# Patient Record
Sex: Male | Born: 1935 | Race: White | Hispanic: No | Smoking: Former smoker
Health system: Southern US, Community
[De-identification: ages and names within clinical notes are randomized; demographics above are authoritative.]

## PROBLEM LIST (undated history)

## (undated) DIAGNOSIS — R609 Edema, unspecified: Secondary | ICD-10-CM

## (undated) DIAGNOSIS — I716 Thoracoabdominal aortic aneurysm, without rupture: Secondary | ICD-10-CM

## (undated) DIAGNOSIS — I10 Essential (primary) hypertension: Secondary | ICD-10-CM

## (undated) DIAGNOSIS — I482 Chronic atrial fibrillation, unspecified: Secondary | ICD-10-CM

## (undated) DIAGNOSIS — H9190 Unspecified hearing loss, unspecified ear: Secondary | ICD-10-CM

## (undated) DIAGNOSIS — I5042 Chronic combined systolic (congestive) and diastolic (congestive) heart failure: Secondary | ICD-10-CM

## (undated) DIAGNOSIS — I499 Cardiac arrhythmia, unspecified: Secondary | ICD-10-CM

## (undated) DIAGNOSIS — Z9289 Personal history of other medical treatment: Secondary | ICD-10-CM

## (undated) DIAGNOSIS — Z5189 Encounter for other specified aftercare: Secondary | ICD-10-CM

## (undated) DIAGNOSIS — IMO0001 Reserved for inherently not codable concepts without codable children: Secondary | ICD-10-CM

## (undated) DIAGNOSIS — I2510 Atherosclerotic heart disease of native coronary artery without angina pectoris: Secondary | ICD-10-CM

## (undated) DIAGNOSIS — M1990 Unspecified osteoarthritis, unspecified site: Secondary | ICD-10-CM

## (undated) DIAGNOSIS — I7100 Dissection of unspecified site of aorta: Secondary | ICD-10-CM

## (undated) HISTORY — PX: JOINT REPLACEMENT: SHX530

## (undated) HISTORY — PX: TONSILLECTOMY: SUR1361

## (undated) HISTORY — DX: Personal history of other medical treatment: Z92.89

## (undated) HISTORY — PX: CARDIAC CATHETERIZATION: SHX172

## (undated) HISTORY — PX: REPLACEMENT TOTAL KNEE: SUR1224

## (undated) HISTORY — PX: HERNIA REPAIR: SHX51

---

## 2006-08-26 ENCOUNTER — Ambulatory Visit: Payer: Self-pay | Admitting: Cardiology

## 2006-09-07 ENCOUNTER — Ambulatory Visit: Payer: Self-pay | Admitting: Cardiology

## 2007-09-10 LAB — HM COLONOSCOPY

## 2008-02-10 ENCOUNTER — Ambulatory Visit: Payer: Self-pay | Admitting: Internal Medicine

## 2008-02-10 DIAGNOSIS — I482 Chronic atrial fibrillation, unspecified: Secondary | ICD-10-CM

## 2008-02-10 DIAGNOSIS — I10 Essential (primary) hypertension: Secondary | ICD-10-CM

## 2008-02-10 DIAGNOSIS — M19079 Primary osteoarthritis, unspecified ankle and foot: Secondary | ICD-10-CM

## 2008-02-10 DIAGNOSIS — N400 Enlarged prostate without lower urinary tract symptoms: Secondary | ICD-10-CM | POA: Insufficient documentation

## 2008-02-10 DIAGNOSIS — I7100 Dissection of unspecified site of aorta: Secondary | ICD-10-CM

## 2008-07-04 ENCOUNTER — Encounter: Payer: Self-pay | Admitting: Internal Medicine

## 2008-08-14 ENCOUNTER — Ambulatory Visit: Payer: Self-pay | Admitting: Internal Medicine

## 2008-08-16 LAB — CONVERTED CEMR LAB
Basophils Absolute: 0 10*3/uL (ref 0.0–0.1)
Basophils Relative: 0.1 % (ref 0.0–3.0)
CO2: 30 meq/L (ref 19–32)
Calcium: 9.3 mg/dL (ref 8.4–10.5)
Creatinine, Ser: 1.1 mg/dL (ref 0.4–1.5)
Digitoxin Lvl: 0.3 ng/mL — ABNORMAL LOW (ref 0.8–2.0)
Eosinophils Absolute: 0.3 10*3/uL (ref 0.0–0.7)
GFR calc non Af Amer: 69.21 mL/min (ref >60)
Lymphocytes Relative: 14.2 % (ref 12.0–46.0)
MCHC: 33.8 g/dL (ref 30.0–36.0)
Monocytes Relative: 8.7 % (ref 3.0–12.0)
Neutrophils Relative %: 72 % (ref 43.0–77.0)
RBC: 3.89 M/uL — ABNORMAL LOW (ref 4.22–5.81)

## 2008-10-04 ENCOUNTER — Ambulatory Visit: Payer: Self-pay | Admitting: Family Medicine

## 2008-10-19 ENCOUNTER — Ambulatory Visit: Payer: Self-pay | Admitting: Family Medicine

## 2009-01-10 ENCOUNTER — Encounter: Payer: Self-pay | Admitting: Internal Medicine

## 2009-02-12 ENCOUNTER — Ambulatory Visit: Payer: Self-pay | Admitting: Internal Medicine

## 2009-02-12 DIAGNOSIS — E785 Hyperlipidemia, unspecified: Secondary | ICD-10-CM

## 2009-02-13 LAB — CONVERTED CEMR LAB
AST: 33 units/L (ref 0–37)
Albumin: 3.8 g/dL (ref 3.5–5.2)
Alkaline Phosphatase: 85 units/L (ref 39–117)
Cholesterol: 131 mg/dL (ref 0–200)
Creatinine, Ser: 1.1 mg/dL (ref 0.4–1.5)
Eosinophils Relative: 4.5 % (ref 0.0–5.0)
GFR calc non Af Amer: 69.12 mL/min (ref >60)
Glucose, Bld: 89 mg/dL (ref 70–99)
LDL Cholesterol: 63 mg/dL (ref 0–99)
Monocytes Absolute: 0.6 10*3/uL (ref 0.1–1.0)
Monocytes Relative: 9 % (ref 3.0–12.0)
Neutrophils Relative %: 69.1 % (ref 43.0–77.0)
Phosphorus: 3.8 mg/dL (ref 2.3–4.6)
Platelets: 194 10*3/uL (ref 150.0–400.0)
Total Protein: 6.6 g/dL (ref 6.0–8.3)
Triglycerides: 55 mg/dL (ref 0.0–149.0)
WBC: 6.3 10*3/uL (ref 4.5–10.5)

## 2009-03-27 ENCOUNTER — Encounter: Payer: Self-pay | Admitting: Internal Medicine

## 2009-06-01 ENCOUNTER — Encounter: Payer: Self-pay | Admitting: Internal Medicine

## 2009-06-06 ENCOUNTER — Encounter: Admission: RE | Admit: 2009-06-06 | Discharge: 2009-06-06 | Payer: Self-pay | Admitting: Cardiovascular Disease

## 2009-06-12 ENCOUNTER — Inpatient Hospital Stay (HOSPITAL_COMMUNITY): Admission: RE | Admit: 2009-06-12 | Discharge: 2009-06-19 | Payer: Self-pay | Admitting: Cardiovascular Disease

## 2009-06-12 ENCOUNTER — Ambulatory Visit: Payer: Self-pay | Admitting: Surgery

## 2009-06-12 DIAGNOSIS — I716 Thoracoabdominal aortic aneurysm, without rupture, unspecified: Secondary | ICD-10-CM

## 2009-06-12 HISTORY — DX: Thoracoabdominal aortic aneurysm, without rupture, unspecified: I71.60

## 2009-06-12 HISTORY — DX: Thoracoabdominal aortic aneurysm, without rupture: I71.6

## 2009-06-12 HISTORY — PX: CORONARY ARTERY BYPASS GRAFT: SHX141

## 2009-07-10 ENCOUNTER — Ambulatory Visit: Payer: Self-pay | Admitting: Surgery

## 2009-07-10 ENCOUNTER — Encounter: Admission: RE | Admit: 2009-07-10 | Discharge: 2009-07-10 | Payer: Self-pay | Admitting: Surgery

## 2009-07-18 ENCOUNTER — Encounter: Payer: Self-pay | Admitting: Internal Medicine

## 2009-07-26 DIAGNOSIS — I2510 Atherosclerotic heart disease of native coronary artery without angina pectoris: Secondary | ICD-10-CM

## 2009-08-15 ENCOUNTER — Ambulatory Visit: Payer: Self-pay | Admitting: Internal Medicine

## 2009-08-20 ENCOUNTER — Telehealth: Payer: Self-pay | Admitting: Internal Medicine

## 2009-08-30 ENCOUNTER — Encounter: Payer: PRIVATE HEALTH INSURANCE | Admitting: Cardiovascular Disease

## 2009-09-03 ENCOUNTER — Telehealth: Payer: Self-pay | Admitting: Internal Medicine

## 2009-09-09 ENCOUNTER — Encounter: Payer: PRIVATE HEALTH INSURANCE | Admitting: Cardiovascular Disease

## 2009-09-11 ENCOUNTER — Encounter: Payer: Self-pay | Admitting: Internal Medicine

## 2009-10-10 ENCOUNTER — Encounter: Payer: PRIVATE HEALTH INSURANCE | Admitting: Cardiovascular Disease

## 2009-11-16 ENCOUNTER — Ambulatory Visit: Payer: Self-pay | Admitting: Internal Medicine

## 2009-11-16 DIAGNOSIS — K439 Ventral hernia without obstruction or gangrene: Secondary | ICD-10-CM | POA: Insufficient documentation

## 2010-01-28 ENCOUNTER — Ambulatory Visit: Payer: Self-pay | Admitting: Internal Medicine

## 2010-01-29 LAB — CONVERTED CEMR LAB
Albumin: 3.9 g/dL (ref 3.5–5.2)
Basophils Relative: 1.1 % (ref 0.0–3.0)
Bilirubin, Direct: 0.2 mg/dL (ref 0.0–0.3)
CO2: 31 meq/L (ref 19–32)
Calcium: 9.6 mg/dL (ref 8.4–10.5)
Cholesterol: 123 mg/dL (ref 0–200)
Eosinophils Relative: 3.5 % (ref 0.0–5.0)
Glucose, Bld: 86 mg/dL (ref 70–99)
HCT: 37.5 % — ABNORMAL LOW (ref 39.0–52.0)
HDL: 54.7 mg/dL (ref >39.00)
LDL Cholesterol: 57 mg/dL (ref 0–99)
MCV: 96.3 fL (ref 78.0–100.0)
Monocytes Absolute: 0.6 10*3/uL (ref 0.1–1.0)
Neutrophils Relative %: 73.6 % (ref 43.0–77.0)
Potassium: 4.6 meq/L (ref 3.5–5.1)
RBC: 3.89 M/uL — ABNORMAL LOW (ref 4.22–5.81)
Total Protein: 6.1 g/dL (ref 6.0–8.3)
Triglycerides: 57 mg/dL (ref 0.0–149.0)
VLDL: 11.4 mg/dL (ref 0.0–40.0)
WBC: 6.9 10*3/uL (ref 4.5–10.5)

## 2010-04-16 ENCOUNTER — Ambulatory Visit (HOSPITAL_COMMUNITY)
Admission: RE | Admit: 2010-04-16 | Discharge: 2010-04-16 | Payer: Self-pay | Source: Home / Self Care | Admitting: Cardiovascular Disease

## 2010-06-11 NOTE — Letter (Signed)
Summary: [facility]  [facility]   Imported By: Lanelle Bal 07/24/2009 09:15:04  _____________________________________________________________________  External Attachment:    Type:   Image     Comment:   External Document  Appended Document: [facility]    Clinical Lists Changes  Problems: Added new problem of CORONARY ARTERY DISEASE (ICD-414.00) Observations: Added new observation of PAST SURG HX: RIH  then hydrocele repair 1970's Bilat knee replacements 2003 Type B aortic dissection --2007 CABG for multvessel disease including 90% left main  2/11   Dr Laneta Simmers (07/26/2009 15:36) Added new observation of PAST MED HX: Atrial fibrillation------------------------------------------Dr Tresa Endo Hypertension Osteoarthritis Type B Aortic dissection Benign prostatic hypertrophy Hyperlipidemia Coronary artery disease  (07/26/2009 15:36) Added new observation of HX  CAD: yes (07/26/2009 15:36)       Past History:  Past Medical History: Atrial fibrillation------------------------------------------Dr Tresa Endo Hypertension Osteoarthritis Type B Aortic dissection Benign prostatic hypertrophy Hyperlipidemia Coronary artery disease  Past Surgical History: RIH  then hydrocele repair 1970's Bilat knee replacements 2003 Type B aortic dissection --2007 CABG for multvessel disease including 90% left main  2/11   Dr Laneta Simmers

## 2010-06-11 NOTE — Assessment & Plan Note (Signed)
Summary: growth under breast bone/alc   Vital Signs:  Patient profile:   77 year old male Weight:      251 pounds Temp:     98.1 degrees F oral Pulse rate:   68 / minute Pulse rhythm:   regular BP sitting:   140 / 72  (right arm) Cuff size:   regular  Vitals Entered By: Lowella Petties CMA (November 16, 2009 11:21 AM) CC: Knot on chest   History of Present Illness: Had cold after CABG lots of cough then During this time, he noticed bulge under xiphoid now it seems to be out all the time seems bigger No pain  no chest pain  Notices himself taking shallow breaths sinus congestion at night will affect his breathing  Allergies: No Known Drug Allergies  Past History:  Past medical, surgical, family and social histories (including risk factors) reviewed for relevance to current acute and chronic problems.  Past Medical History: Reviewed history from 07/26/2009 and no changes required. Atrial fibrillation------------------------------------------Dr Tresa Endo Hypertension Osteoarthritis Type B Aortic dissection Benign prostatic hypertrophy Hyperlipidemia Coronary artery disease  Past Surgical History: Reviewed history from 07/26/2009 and no changes required. RIH  then hydrocele repair 1970's Bilat knee replacements 2003 Type B aortic dissection --2007 CABG for multvessel disease including 90% left main  2/11   Dr Laneta Simmers  Family History: Reviewed history from 02/10/2008 and no changes required. Dad died in 89's  CHF. Had HTN Mom died in 24's   old age 33 brother died of COPD No known CAD, DM No prostate or colon cancer  Social History: Reviewed history from 02/10/2008 and no changes required. 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 Wife to make decisions, then [name], daughter.  Would want resuscitation attempts but no prolonged artificial ventilation. Not  sure about tube feedings  Review of Systems       weight up 20# since last visit back up from weight loss during CABG but beyond working on restarting exercise  Physical Exam  General:  alert and normal appearance.   Neck:  supple and no masses.   Lungs:  normal respiratory effort, no intercostal retractions, and no accessory muscle use.  Slightly decreased breath sounds at bases but clear Heart:  normal rate, regular rhythm, no murmur, and no gallop.   Abdomen:  small reducible ventral hernia just below xiphoid non tender   Impression & Recommendations:  Problem # 1:  VENTRAL HERNIA (ICD-553.20) Assessment New reassured that this is not worrisome no Rx needed  Complete Medication List: 1)  Flomax 0.4 Mg Xr24h-cap (Tamsulosin hcl) .... Take one capsule by mouth daily after meal 2)  Digoxin 0.125 Mg Tabs (Digoxin) .... Take on by mouth daily 3)  Klor-con 10 10 Meq Cr-tabs (Potassium chloride) .... Take one by mouth daily 4)  Furosemide 20 Mg Tabs (Furosemide) .... Take 2 by mouth (40mg ) on tuesday, friday, sunday and 20mg  the remaining days 5)  Amoxicillin 500 Mg Caps (Amoxicillin) .... Take 4 capsules one hour prior to dental appointment 6)  Centrum Silver Tabs (Multiple vitamins-minerals) .... Take one by mouth daily 7)  Aspirin 325 Mg Tabs (Aspirin) .... Take one by mouth daily 8)  Simvastatin 20 Mg Tabs (Simvastatin) .... Take one tablet once daily 9)  Tylenol Pm Extra Strength 500-25 Mg Tabs (Diphenhydramine-apap (sleep)) .... Take 2 by mouth at bedtime 10)  Metoprolol Tartrate 100 Mg Tabs (Metoprolol tartrate) .... Take  one by mouth two times a day 11)  Enalapril Maleate 5 Mg Tabs (Enalapril maleate) .... Take 1 by mouth two times a day 12)  Tramadol Hcl 50 Mg Tabs (Tramadol hcl) .... 1/2-1 tab at bedtime as needed for cough 13)  Amlodipine Besylate 5 Mg Tabs (Amlodipine besylate) .... Take one by mouth daily  Patient Instructions: 1)  Please keep regular follow  up  Prior Medications (reviewed today): FLOMAX 0.4 MG XR24H-CAP (TAMSULOSIN HCL) Take one capsule by mouth daily after meal DIGOXIN 0.125 MG TABS (DIGOXIN) Take on by mouth daily KLOR-CON 10 10 MEQ CR-TABS (POTASSIUM CHLORIDE) Take one by mouth daily FUROSEMIDE 20 MG TABS (FUROSEMIDE) take 2 by mouth (40mg ) on Tuesday, Friday, Sunday and 20mg  the remaining days AMOXICILLIN 500 MG CAPS (AMOXICILLIN) Take 4 capsules one hour prior to dental appointment CENTRUM SILVER  TABS (MULTIPLE VITAMINS-MINERALS) Take one by mouth daily ASPIRIN 325 MG TABS (ASPIRIN) Take one by mouth daily SIMVASTATIN 20 MG TABS (SIMVASTATIN) take one tablet once daily TYLENOL PM EXTRA STRENGTH 500-25 MG TABS (DIPHENHYDRAMINE-APAP (SLEEP)) Take 2 by mouth at bedtime METOPROLOL TARTRATE 100 MG TABS (METOPROLOL TARTRATE) take one by mouth two times a day ENALAPRIL MALEATE 5 MG TABS (ENALAPRIL MALEATE) take 1 by mouth two times a day TRAMADOL HCL 50 MG TABS (TRAMADOL HCL) 1/2-1 tab at bedtime as needed for cough AMLODIPINE BESYLATE 5 MG TABS (AMLODIPINE BESYLATE) take one by mouth daily Current Allergies: No known allergies

## 2010-06-11 NOTE — Letter (Signed)
Summary: [facility]  [facility]   Imported By: Lanelle Bal 09/20/2009 07:50:58  _____________________________________________________________________  External Attachment:    Type:   Image     Comment:   External Document  Appended Document: [facility] recurrent atrial fib increasing metoprolol to 100 two times a day  adding amlodipine 5mg  daily

## 2010-06-11 NOTE — Assessment & Plan Note (Signed)
Summary: FOLLOW UP / LFW   Vital Signs:  Patient profile:   77 year old male Weight:      229 pounds BMI:     34.69 O2 Sat:      98 % on Room air Temp:     97.8 degrees F oral Pulse rate:   52 / minute Pulse rhythm:   regular Resp:     16 per minute BP sitting:   112 / 60  (left arm) Cuff size:   large  Vitals Entered By: Mervin Hack CMA Duncan Dull) (August 15, 2009 11:40 AM)  O2 Flow:  Room air CC: 6 month follow-up    History of Present Illness: Had CABG in February  had been recovering well Just starting on cardiac rehab  No chest pain Post-op pain is better Leg was the worst and that is now better  Breathing is fair Currently with a cold for 2 weeks Bad cough Feels the cold is improved but cough isn't  Still with PND No sore throat Breathing is okay--has to breathe through mouth due to congestion  No urinary problems just same weak stream at night stable nocturia x 2  some arthritis pain no meds except tylenol  Allergies: No Known Drug Allergies  Past History:  Past medical, surgical, family and social histories (including risk factors) reviewed for relevance to current acute and chronic problems.  Past Medical History: Reviewed history from 07/26/2009 and no changes required. Atrial fibrillation------------------------------------------Dr Tresa Endo Hypertension Osteoarthritis Type B Aortic dissection Benign prostatic hypertrophy Hyperlipidemia Coronary artery disease  Past Surgical History: Reviewed history from 07/26/2009 and no changes required. RIH  then hydrocele repair 1970's Bilat knee replacements 2003 Type B aortic dissection --2007 CABG for multvessel disease including 90% left main  2/11   Dr Laneta Simmers  Family History: Reviewed history from 02/10/2008 and no changes required. Dad died in 91's  CHF. Had HTN Mom died in 29's   old age 5 brother died of COPD No known CAD, DM No prostate or colon cancer  Social History: Reviewed  history from 02/10/2008 and no changes required. 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 Wife to make decisions, then [name], daughter.  Would want resuscitation attempts but no prolonged artificial ventilation. Not sure about tube feedings  Review of Systems       not sleeping great---occ the cough is the issue No palpitations  Physical Exam  General:  alert and normal appearance.   Nose:  mild inflammation and opaque white mucus Mouth:  no erythema and no exudates.   Neck:  supple, no masses, no thyromegaly, no carotid bruits, and no cervical lymphadenopathy.   Lungs:  normal respiratory effort, no intercostal retractions, and no accessory muscle use.  Faint widespread rhonchi No wheezes or crackles Heart:  normal rate, regular rhythm, and no gallop.   ?faint aortic systolic murmur Extremities:  no edema Psych:  normally interactive, good eye contact, not anxious appearing, and not depressed appearing.     Impression & Recommendations:  Problem # 1:  URI (ICD-465.9) Assessment New seems to be viral lingering cough  will treat with tramadol for night cough would consider antibiotic if not resolving by next week  Problem # 2:  CORONARY ARTERY DISEASE (ICD-414.00) Assessment: Comment Only doing well since CABG  The following medications were removed from the medication list:    Labetalol Hcl 200 Mg Tabs (Labetalol hcl) .Marland Kitchen... Take  one tablet by mouth two times a day    Enalapril Maleate 10 Mg Tabs (Enalapril maleate) .Marland Kitchen... Take one by mouth two times a day    Amlodipine Besylate 10 Mg Tabs (Amlodipine besylate) .Marland Kitchen... Take one by mouth every day His updated medication list for this problem includes:    Furosemide 20 Mg Tabs (Furosemide) .Marland Kitchen... Take 2 by mouth (40mg ) on tuesday, friday, sunday and 20mg  the remaining days    Aspirin 325 Mg Tabs (Aspirin) .Marland Kitchen... Take  one by mouth daily    Metoprolol Tartrate 50 Mg Tabs (Metoprolol tartrate) .Marland Kitchen... Take 1 by mouth two times a day    Enalapril Maleate 5 Mg Tabs (Enalapril maleate) .Marland Kitchen... Take 1 by mouth two times a day  Problem # 3:  ATRIAL FIBRILLATION (ICD-427.31) Assessment: Comment Only regular rhythm no symptoms  The following medications were removed from the medication list:    Labetalol Hcl 200 Mg Tabs (Labetalol hcl) .Marland Kitchen... Take one tablet by mouth two times a day    Amlodipine Besylate 10 Mg Tabs (Amlodipine besylate) .Marland Kitchen... Take one by mouth every day His updated medication list for this problem includes:    Digoxin 0.125 Mg Tabs (Digoxin) .Marland Kitchen... Take on by mouth daily    Aspirin 325 Mg Tabs (Aspirin) .Marland Kitchen... Take one by mouth daily    Metoprolol Tartrate 50 Mg Tabs (Metoprolol tartrate) .Marland Kitchen... Take 1 by mouth two times a day  Problem # 4:  HYPERLIPIDEMIA (ICD-272.4) Assessment: Unchanged good control labs again next time  His updated medication list for this problem includes:    Simvastatin 20 Mg Tabs (Simvastatin) .Marland Kitchen... Take one tablet once daily  Labs Reviewed: SGOT: 33 (02/12/2009)   SGPT: 69 (02/12/2009)   HDL:56.70 (02/12/2009)  LDL:63 (02/12/2009)  Chol:131 (02/12/2009)  Trig:55.0 (02/12/2009)  Problem # 5:  OSTEOARTHRITIS (ICD-715.90) Assessment: Unchanged okay without meds  Problem # 6:  HYPERTENSION (ICD-401.9) Assessment: Unchanged good control on heart meds  The following medications were removed from the medication list:    Labetalol Hcl 200 Mg Tabs (Labetalol hcl) .Marland Kitchen... Take one tablet by mouth two times a day    Enalapril Maleate 10 Mg Tabs (Enalapril maleate) .Marland Kitchen... Take one by mouth two times a day    Amlodipine Besylate 10 Mg Tabs (Amlodipine besylate) .Marland Kitchen... Take one by mouth every day His updated medication list for this problem includes:    Furosemide 20 Mg Tabs (Furosemide) .Marland Kitchen... Take 2 by mouth (40mg ) on tuesday, friday, sunday and 20mg  the remaining days     Metoprolol Tartrate 50 Mg Tabs (Metoprolol tartrate) .Marland Kitchen... Take 1 by mouth two times a day    Enalapril Maleate 5 Mg Tabs (Enalapril maleate) .Marland Kitchen... Take 1 by mouth two times a day  BP today: 112/60 Prior BP: 110/70 (02/12/2009)  Labs Reviewed: K+: 4.4 (02/12/2009) Creat: : 1.1 (02/12/2009)   Chol: 131 (02/12/2009)   HDL: 56.70 (02/12/2009)   LDL: 63 (02/12/2009)   TG: 55.0 (02/12/2009)  Problem # 7:  BENIGN PROSTATIC HYPERTROPHY (ICD-600.00) Assessment: Unchanged okay on flomax  Complete Medication List: 1)  Flomax 0.4 Mg Xr24h-cap (Tamsulosin hcl) .... Take one capsule by mouth daily after meal 2)  Digoxin 0.125 Mg Tabs (Digoxin) .... Take on by mouth daily 3)  Klor-con 10 10 Meq Cr-tabs (Potassium chloride) .... Take one by mouth daily 4)  Furosemide 20 Mg Tabs (Furosemide) .... Take 2 by mouth (40mg ) on tuesday, friday, sunday and 20mg  the remaining days 5)  Amoxicillin 500 Mg Caps (Amoxicillin) .Marland KitchenMarland KitchenMarland Kitchen  Take 4 capsules one hour prior to dental appointment 6)  Centrum Silver Tabs (Multiple vitamins-minerals) .... Take one by mouth daily 7)  Aspirin 325 Mg Tabs (Aspirin) .... Take one by mouth daily 8)  Simvastatin 20 Mg Tabs (Simvastatin) .... Take one tablet once daily 9)  Tylenol Pm Extra Strength 500-25 Mg Tabs (Diphenhydramine-apap (sleep)) .... Take 2 by mouth at bedtime 10)  Metoprolol Tartrate 50 Mg Tabs (Metoprolol tartrate) .... Take 1 by mouth two times a day 11)  Enalapril Maleate 5 Mg Tabs (Enalapril maleate) .... Take 1 by mouth two times a day 12)  Tramadol Hcl 50 Mg Tabs (Tramadol hcl) .... 1/2-1 tab at bedtime as needed for cough  Patient Instructions: 1)  Please schedule a follow-up appointment in 6 months .  Prescriptions: TRAMADOL HCL 50 MG TABS (TRAMADOL HCL) 1/2-1 tab at bedtime as needed for cough  #30 x 0   Entered and Authorized by:   Cindee Salt MD   Signed by:   Cindee Salt MD on 08/15/2009   Method used:   Electronically to        [facility] [phone]* (retail)       [address] [place], [place]  [address]       Ph: [phone] or [phone]       Fax: [phone]   RxID:   [id number]   Current Allergies (reviewed today): No known allergies   Appended Document: Orders Update    Clinical Lists Changes  Orders: Added new Service order of Prescription Created Electronically [phone]) - Signed

## 2010-06-11 NOTE — Assessment & Plan Note (Signed)
Summary: 6 M F/U DLO   Vital Signs:  Patient profile:   77 year old male Weight:      240 pounds Temp:     98.3 degrees F oral Pulse rate:   60 / minute Pulse rhythm:   regular BP sitting:   120 / 80  (left arm) Cuff size:   large  Vitals Entered By: Mervin Hack CMA Duncan Dull) (January 28, 2010 12:18 PM) CC: 6 month follow-up   History of Present Illness: Doing okay  No trouble from ventral hernia  Has ongoing tinnitus wonders about trying "lipo flavinoid" discussed okay to try Has new hearing aides  Has some urinary frequency he relates this to the furosemide---takes two times a day 3 days per week  Still in cardiac rehab--now in the follow up plan goes for exercise twice a week Not much other exercise Just can't walk due to the ankle  No chest pain No sig SOB--stable exertional abilities  Ongoing arthritis pain hands, feet and occ elbow. ANkle is the worst uses tylenol PM  doesn't remember the tramadol  Takes BP daily No awareness of atrial fib but occ notes pulse not steady  Allergies: No Known Drug Allergies  Past History:  Past medical, surgical, family and social histories (including risk factors) reviewed for relevance to current acute and chronic problems.  Past Medical History: Reviewed history from 07/26/2009 and no changes required. Atrial fibrillation------------------------------------------Dr Tresa Endo Hypertension Osteoarthritis Type B Aortic dissection Benign prostatic hypertrophy Hyperlipidemia Coronary artery disease  Past Surgical History: Reviewed history from 07/26/2009 and no changes required. RIH  then hydrocele repair 1970's Bilat knee replacements 2003 Type B aortic dissection --2007 CABG for multvessel disease including 90% left main  2/11   Dr Laneta Simmers  Family History: Reviewed history from 02/10/2008 and no changes required. Dad died in 62's  CHF. Had HTN Mom died in 75's   old age 3 brother died of COPD No known CAD,  DM No prostate or colon cancer  Social History: Reviewed history from 02/10/2008 and no changes required. 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 Wife to make decisions, then [name], daughter.  Would want resuscitation attempts but no prolonged artificial ventilation. Not sure about tube feedings  Review of Systems       sleeps about 5-6 hours at night and a nap weight down 11# since last visit--he and wife were not aware of this  Physical Exam  General:  alert and normal appearance.   Neck:  supple, no masses, no thyromegaly, no carotid bruits, and no cervical lymphadenopathy.   Lungs:  normal respiratory effort, no intercostal retractions, no accessory muscle use, and normal breath sounds.   Heart:  normal rate, no gallop, and irregular rhythm.   ?faint murmur Msk:  no joint tenderness and no joint swelling.   Extremities:  1+ tense edema without pitting Psych:  normally interactive, good eye contact, not anxious appearing, and not depressed appearing.     Impression & Recommendations:  Problem # 1:  ATRIAL FIBRILLATION (ICD-427.31) Assessment Comment Only in atrial fib again good rate control on ASA only due to aortic dissection  His updated medication list for this problem includes:    Digoxin 0.125 Mg Tabs (Digoxin) .Marland Kitchen... Take on by mouth daily    Metoprolol Tartrate 100 Mg Tabs (Metoprolol tartrate) .Marland Kitchen... Take one by mouth two times a day    Amlodipine Besylate 5 Mg Tabs (Amlodipine  besylate) .Marland Kitchen... Take one by mouth daily    Aspirin 325 Mg Tabs (Aspirin) .Marland Kitchen... Take one by mouth daily  Problem # 2:  CORONARY ARTERY DISEASE (ICD-414.00) Assessment: Comment Only  still doing rehab follow up after CABG  His updated medication list for this problem includes:    Furosemide 20 Mg Tabs (Furosemide) .Marland Kitchen... Take 2 by mouth (40mg ) on tuesday, friday, sunday and  20mg  the remaining days    Metoprolol Tartrate 100 Mg Tabs (Metoprolol tartrate) .Marland Kitchen... Take one by mouth two times a day    Enalapril Maleate 5 Mg Tabs (Enalapril maleate) .Marland Kitchen... Take 1 by mouth two times a day    Amlodipine Besylate 5 Mg Tabs (Amlodipine besylate) .Marland Kitchen... Take one by mouth daily    Aspirin 325 Mg Tabs (Aspirin) .Marland Kitchen... Take one by mouth daily  Orders: TLB-CBC Platelet - w/Differential (85025-CBCD) TLB-Renal Function Panel (80069-RENAL) TLB-TSH (Thyroid Stimulating Hormone) (84443-TSH)  Problem # 3:  HYPERLIPIDEMIA (ICD-272.4) Assessment: Unchanged  due for labs no problems with meds  His updated medication list for this problem includes:    Simvastatin 20 Mg Tabs (Simvastatin) .Marland Kitchen... Take one tablet once daily  Labs Reviewed: SGOT: 33 (02/12/2009)   SGPT: 69 (02/12/2009)   HDL:56.70 (02/12/2009)  LDL:63 (02/12/2009)  Chol:131 (02/12/2009)  Trig:55.0 (02/12/2009)  Orders: TLB-Lipid Panel (80061-LIPID) TLB-Hepatic/Liver Function Pnl (80076-HEPATIC) Venipuncture (98119)  Problem # 4:  OSTEOARTHRITIS (ICD-715.90) Assessment: Unchanged prefers no meds will try tramadol again if he needs more help  His updated medication list for this problem includes:    Tramadol Hcl 50 Mg Tabs (Tramadol hcl) .Marland Kitchen... 1/2-1 tab at bedtime as needed for cough    Aspirin 325 Mg Tabs (Aspirin) .Marland Kitchen... Take one by mouth daily  Problem # 5:  BENIGN PROSTATIC HYPERTROPHY (ICD-600.00) Assessment: Comment Only some frequency but does okay  Complete Medication List: 1)  Flomax 0.4 Mg Xr24h-cap (Tamsulosin hcl) .... Take one capsule by mouth daily after meal 2)  Digoxin 0.125 Mg Tabs (Digoxin) .... Take on by mouth daily 3)  Klor-con 10 10 Meq Cr-tabs (Potassium chloride) .... Take one by mouth daily 4)  Furosemide 20 Mg Tabs (Furosemide) .... Take 2 by mouth (40mg ) on tuesday, friday, sunday and 20mg  the remaining days 5)  Amoxicillin 500 Mg Caps (Amoxicillin) .... Take 4 capsules one hour  prior to dental appointment 6)  Simvastatin 20 Mg Tabs (Simvastatin) .... Take one tablet once daily 7)  Metoprolol Tartrate 100 Mg Tabs (Metoprolol tartrate) .... Take one by mouth two times a day 8)  Enalapril Maleate 5 Mg Tabs (Enalapril maleate) .... Take 1 by mouth two times a day 9)  Tramadol Hcl 50 Mg Tabs (Tramadol hcl) .... 1/2-1 tab at bedtime as needed for cough 10)  Amlodipine Besylate 5 Mg Tabs (Amlodipine besylate) .... Take one by mouth daily 11)  Centrum Silver Tabs (Multiple vitamins-minerals) .... Take one by mouth daily 12)  Aspirin 325 Mg Tabs (Aspirin) .... Take one by mouth daily 13)  Tylenol Pm Extra Strength 500-25 Mg Tabs (Diphenhydramine-apap (sleep)) .... Take 2 by mouth at bedtime  Other Orders: Flu Vaccine 74yrs + (14782) Admin 1st Vaccine (95621) Admin 1st Vaccine [facility]) [phone])  Patient Instructions: 1)  Please schedule a follow-up appointment in 6 months .   Current Allergies (reviewed today): No known allergies    Influenza Vaccine    Vaccine Type: Fluvax 3+    Site: left deltoid    Mfr: [employer]oSmith[employer]    Dose: 0.5 ml  Route: IM    Given by: Mervin Hack CMA (AAMA)    Exp. Date: 11/09/2010    Lot #: [id number]    VIS given: 12/03/06 version given January 28, 2010.  Flu Vaccine Consent Questions    Do you have a history of severe allergic reactions to this vaccine? no    Any prior history of allergic reactions to egg and/or gelatin? no    Do you have a sensitivity to the preservative Thimersol? no    Do you have a past history of Guillan-Barre Syndrome? no    Do you currently have an acute febrile illness? no    Have you ever had a severe reaction to latex? no    Vaccine information given and explained to patient? yes

## 2010-06-11 NOTE — Progress Notes (Signed)
Summary: cough has not improved  Phone Note Call from Patient Call back at Home Phone [phone]   Caller: Spouse Call For: Cindee Salt MD Summary of Call: Wife called to let you know that patients cough has not improved at all and wants to know if there is something else that can [facility] [address] or if he needs to give it more time.  Initial call taken by: Melody Comas,  August 20, 2009 10:09 AM  Follow-up for Phone Call        Not  unusual for cough to linger for at least 2-3 weeks after a cold fades. If the tramadol helps some, continue. If not better in 1-2 weeks, should recheck with CXR Follow-up by: Cindee Salt MD,  August 20, 2009 10:23 AM  Additional Follow-up for Phone Call Additional follow up Details #1::        left message with results on machine, advised pt to call back if any questions Additional Follow-up by: Mervin Hack CMA Duncan Dull),  August 20, 2009 3:04 PM

## 2010-06-11 NOTE — Progress Notes (Signed)
Summary: FLOMAX  Phone Note Refill Request Message from:  Patient on September 03, 2009 10:04 AM  Refills Requested: Medication #1:  FLOMAX 0.4 MG XR24H-CAP Take one capsule by mouth daily after meal Patient would like it sent to [facility] and is also requesting it be for 90 days instead of 30.   Initial call taken by: Melody Comas,  September 03, 2009 10:04 AM    Prescriptions: FLOMAX 0.4 MG XR24H-CAP (TAMSULOSIN HCL) Take one capsule by mouth daily after meal  #90 x 3   Entered by:   Mervin Hack CMA (AAMA)   Authorized by:   Cindee Salt MD   Signed by:   Mervin Hack CMA (AAMA) on 09/03/2009   Method used:   [facility] to        [facility] [phone]* (retail)       [address] [place], [place]  [address]       Ph: [phone] or [phone]       Fax: [phone]   RxID:   [id number]

## 2010-06-11 NOTE — Letter (Signed)
Summary: [facility]  [facility]   Imported By: Lanelle Bal 06/09/2009 10:47:58  _____________________________________________________________________  External Attachment:    Type:   Image     Comment:   External Document  Appended Document: [facility] planning cardiac cath

## 2010-06-13 ENCOUNTER — Encounter: Payer: Self-pay | Admitting: Internal Medicine

## 2010-07-09 NOTE — Letter (Signed)
Summary: [facility]   [facility]   Imported By: Kassie Mends 07/03/2010 09:25:14  _____________________________________________________________________  External Attachment:    Type:   Image     Comment:   External Document  Appended Document: [facility]  increased furosemide due to more edema

## 2010-08-01 LAB — CBC
HCT: 26.9 % — ABNORMAL LOW (ref 39.0–52.0)
HCT: 30.5 % — ABNORMAL LOW (ref 39.0–52.0)
HCT: 36.2 % — ABNORMAL LOW (ref 39.0–52.0)
Hemoglobin: 10.4 g/dL — ABNORMAL LOW (ref 13.0–17.0)
Hemoglobin: 12.2 g/dL — ABNORMAL LOW (ref 13.0–17.0)
Hemoglobin: 9.6 g/dL — ABNORMAL LOW (ref 13.0–17.0)
MCHC: 34 g/dL (ref 30.0–36.0)
MCV: 99 fL (ref 78.0–100.0)
MCV: 99.2 fL (ref 78.0–100.0)
MCV: 99.9 fL (ref 78.0–100.0)
Platelets: 103 10*3/uL — ABNORMAL LOW (ref 150–400)
Platelets: 106 10*3/uL — ABNORMAL LOW (ref 150–400)
Platelets: 125 10*3/uL — ABNORMAL LOW (ref 150–400)
Platelets: 128 10*3/uL — ABNORMAL LOW (ref 150–400)
Platelets: 132 10*3/uL — ABNORMAL LOW (ref 150–400)
RBC: 2.85 MIL/uL — ABNORMAL LOW (ref 4.22–5.81)
RBC: 3 MIL/uL — ABNORMAL LOW (ref 4.22–5.81)
RBC: 3.33 MIL/uL — ABNORMAL LOW (ref 4.22–5.81)
RBC: 3.69 MIL/uL — ABNORMAL LOW (ref 4.22–5.81)
RDW: 13.3 % (ref 11.5–15.5)
RDW: 13.9 % (ref 11.5–15.5)
RDW: 14 % (ref 11.5–15.5)
WBC: 10 10*3/uL (ref 4.0–10.5)
WBC: 6.2 10*3/uL (ref 4.0–10.5)
WBC: 6.6 10*3/uL (ref 4.0–10.5)
WBC: 7.5 10*3/uL (ref 4.0–10.5)
WBC: 9.8 10*3/uL (ref 4.0–10.5)

## 2010-08-01 LAB — BASIC METABOLIC PANEL
BUN: 11 mg/dL (ref 6–23)
BUN: 36 mg/dL — ABNORMAL HIGH (ref 6–23)
BUN: 37 mg/dL — ABNORMAL HIGH (ref 6–23)
CO2: 23 mEq/L (ref 19–32)
Calcium: 8.4 mg/dL (ref 8.4–10.5)
Chloride: 103 mEq/L (ref 96–112)
Chloride: 104 mEq/L (ref 96–112)
Chloride: 110 mEq/L (ref 96–112)
Creatinine, Ser: 0.96 mg/dL (ref 0.4–1.5)
Creatinine, Ser: 1.07 mg/dL (ref 0.4–1.5)
Creatinine, Ser: 1.38 mg/dL (ref 0.4–1.5)
Creatinine, Ser: 2.08 mg/dL — ABNORMAL HIGH (ref 0.4–1.5)
GFR calc Af Amer: >60 mL/min (ref >60)
GFR calc Af Amer: >60 mL/min (ref >60)
GFR calc Af Amer: >60 mL/min (ref >60)
GFR calc Af Amer: >60 mL/min (ref >60)
GFR calc non Af Amer: 31 mL/min — ABNORMAL LOW (ref >60)
GFR calc non Af Amer: 51 mL/min — ABNORMAL LOW (ref >60)
GFR calc non Af Amer: >60 mL/min (ref >60)
Glucose, Bld: 111 mg/dL — ABNORMAL HIGH (ref 70–99)
Glucose, Bld: 112 mg/dL — ABNORMAL HIGH (ref 70–99)
Potassium: 3.7 mEq/L (ref 3.5–5.1)
Potassium: 4.5 mEq/L (ref 3.5–5.1)
Sodium: 136 mEq/L (ref 135–145)
Sodium: 136 mEq/L (ref 135–145)
Sodium: 138 mEq/L (ref 135–145)

## 2010-08-01 LAB — POCT I-STAT 3, ART BLOOD GAS (G3+)
Acid-base deficit: 3 mmol/L — ABNORMAL HIGH (ref 0.0–2.0)
Bicarbonate: 22.5 mEq/L (ref 20.0–24.0)
Patient temperature: 35.9
Patient temperature: 37.1
TCO2: 24 mmol/L (ref 0–100)
pCO2 arterial: 36.8 mmHg (ref 35.0–45.0)
pCO2 arterial: 54.4 mmHg — ABNORMAL HIGH (ref 35.0–45.0)
pH, Arterial: 7.377 (ref 7.350–7.450)
pH, Arterial: 7.395 (ref 7.350–7.450)
pO2, Arterial: 367 mmHg — ABNORMAL HIGH (ref 80.0–100.0)

## 2010-08-01 LAB — GLUCOSE, CAPILLARY
Glucose-Capillary: 116 mg/dL — ABNORMAL HIGH (ref 70–99)
Glucose-Capillary: 120 mg/dL — ABNORMAL HIGH (ref 70–99)
Glucose-Capillary: 120 mg/dL — ABNORMAL HIGH (ref 70–99)
Glucose-Capillary: 122 mg/dL — ABNORMAL HIGH (ref 70–99)
Glucose-Capillary: 127 mg/dL — ABNORMAL HIGH (ref 70–99)
Glucose-Capillary: 131 mg/dL — ABNORMAL HIGH (ref 70–99)
Glucose-Capillary: 141 mg/dL — ABNORMAL HIGH (ref 70–99)
Glucose-Capillary: 93 mg/dL (ref 70–99)

## 2010-08-01 LAB — POCT I-STAT 4, (NA,K, GLUC, HGB,HCT)
Glucose, Bld: 87 mg/dL (ref 70–99)
Glucose, Bld: 97 mg/dL (ref 70–99)
Glucose, Bld: 99 mg/dL (ref 70–99)
HCT: 28 % — ABNORMAL LOW (ref 39.0–52.0)
HCT: 29 % — ABNORMAL LOW (ref 39.0–52.0)
HCT: 31 % — ABNORMAL LOW (ref 39.0–52.0)
HCT: 33 % — ABNORMAL LOW (ref 39.0–52.0)
Hemoglobin: 11.2 g/dL — ABNORMAL LOW (ref 13.0–17.0)
Hemoglobin: 9.9 g/dL — ABNORMAL LOW (ref 13.0–17.0)
Hemoglobin: 9.9 g/dL — ABNORMAL LOW (ref 13.0–17.0)
Potassium: 3.6 mEq/L (ref 3.5–5.1)
Potassium: 3.6 mEq/L (ref 3.5–5.1)
Potassium: 4.2 mEq/L (ref 3.5–5.1)
Sodium: 137 mEq/L (ref 135–145)
Sodium: 137 mEq/L (ref 135–145)
Sodium: 138 mEq/L (ref 135–145)

## 2010-08-01 LAB — LIPID PANEL
Cholesterol: 106 mg/dL (ref 0–200)
HDL: 51 mg/dL (ref >39)

## 2010-08-01 LAB — BLOOD GAS, ARTERIAL
Bicarbonate: 25.6 mEq/L — ABNORMAL HIGH (ref 20.0–24.0)
Patient temperature: 98.8
TCO2: 26.8 mmol/L (ref 0–100)
pCO2 arterial: 38.5 mmHg (ref 35.0–45.0)
pH, Arterial: 7.439 (ref 7.350–7.450)
pO2, Arterial: 79.1 mmHg — ABNORMAL LOW (ref 80.0–100.0)

## 2010-08-01 LAB — COMPREHENSIVE METABOLIC PANEL
ALT: 19 U/L (ref 0–53)
Albumin: 3 g/dL — ABNORMAL LOW (ref 3.5–5.2)
Alkaline Phosphatase: 47 U/L (ref 39–117)
BUN: 13 mg/dL (ref 6–23)
Chloride: 101 mEq/L (ref 96–112)
Glucose, Bld: 103 mg/dL — ABNORMAL HIGH (ref 70–99)
Potassium: 3.8 mEq/L (ref 3.5–5.1)
Sodium: 134 mEq/L — ABNORMAL LOW (ref 135–145)
Total Bilirubin: 0.6 mg/dL (ref 0.3–1.2)

## 2010-08-01 LAB — URINALYSIS, ROUTINE W REFLEX MICROSCOPIC
Glucose, UA: NEGATIVE mg/dL
Ketones, ur: NEGATIVE mg/dL
Nitrite: NEGATIVE
Protein, ur: NEGATIVE mg/dL
Urobilinogen, UA: 0.2 mg/dL (ref 0.0–1.0)

## 2010-08-01 LAB — HEMOGLOBIN AND HEMATOCRIT, BLOOD
HCT: 26.1 % — ABNORMAL LOW (ref 39.0–52.0)
Hemoglobin: 8.9 g/dL — ABNORMAL LOW (ref 13.0–17.0)
Hemoglobin: 9 g/dL — ABNORMAL LOW (ref 13.0–17.0)

## 2010-08-01 LAB — CREATININE, SERUM
Creatinine, Ser: 0.94 mg/dL (ref 0.4–1.5)
Creatinine, Ser: 2.1 mg/dL — ABNORMAL HIGH (ref 0.4–1.5)
GFR calc Af Amer: 37 mL/min — ABNORMAL LOW (ref >60)
GFR calc Af Amer: >60 mL/min (ref >60)
GFR calc non Af Amer: 31 mL/min — ABNORMAL LOW (ref >60)
GFR calc non Af Amer: >60 mL/min (ref >60)

## 2010-08-01 LAB — HEMOGLOBIN A1C
Hgb A1c MFr Bld: 5.7 % (ref 4.6–6.1)
Mean Plasma Glucose: 117 mg/dL

## 2010-08-01 LAB — MRSA PCR SCREENING: MRSA by PCR: NEGATIVE

## 2010-08-01 LAB — PROTIME-INR: INR: 1.62 — ABNORMAL HIGH (ref 0.00–1.49)

## 2010-08-01 LAB — TYPE AND SCREEN: ABO/RH(D): O POS

## 2010-08-01 LAB — MAGNESIUM
Magnesium: 2.6 mg/dL — ABNORMAL HIGH (ref 1.5–2.5)
Magnesium: 2.6 mg/dL — ABNORMAL HIGH (ref 1.5–2.5)

## 2010-08-01 LAB — APTT: aPTT: 38 seconds — ABNORMAL HIGH (ref 24–37)

## 2010-08-01 LAB — POCT I-STAT, CHEM 8
BUN: 13 mg/dL (ref 6–23)
Calcium, Ion: 1.21 mmol/L (ref 1.12–1.32)
Hemoglobin: 11.2 g/dL — ABNORMAL LOW (ref 13.0–17.0)
Sodium: 135 mEq/L (ref 135–145)
TCO2: 22 mmol/L (ref 0–100)

## 2010-08-01 LAB — ABO/RH: ABO/RH(D): O POS

## 2010-08-06 ENCOUNTER — Encounter (INDEPENDENT_AMBULATORY_CARE_PROVIDER_SITE_OTHER): Payer: Medicare Other | Admitting: Surgery

## 2010-08-06 DIAGNOSIS — I712 Thoracic aortic aneurysm, without rupture: Secondary | ICD-10-CM

## 2010-08-06 NOTE — Assessment & Plan Note (Signed)
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  I last saw him on July 10, 2009, for followup after coronary artery bypass graft surgery on June 14, 2009.  He was doing well at that time.  We decided to repeat his MR angiogram 1 year later to follow up on his aneurysms.  He said that over the past year, he has been feeling fairly well.  He denies any chest pain or pressure. He has had no shortness of breath.  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  He does report some swelling in both legs.  On physical examination today, his blood pressure is 140/77, pulse 59 and irregular.  He is in chronic atrial fibrillation.  Respiratory rate is 20 and unlabored.  Oxygen saturation on room air is 96%.  He looks well.  Cardiac exam shows a irregular rate and rhythm with a soft 1/6 systolic murmur.  There is no diastolic murmur.  His lungs are clear. The sternum is stable and the prior sternotomy incision is well-healed. He has mild bilateral lower extremity edema to the knee.  CT angiogram of the chest and abdomen dated April 16, 2010, was reviewed and showed aneurysmal dilatation of the ascending aorta arch and descending thoracic and abdominal aorta.  The maximum diameter of the proximal descending aorta was 6-cm transversely and 4.3-cm in the AP dimension.  There is a limited type B dissection in the proximal descending thoracic aorta.  The maximal diameter of his ascending aorta was about 4.7-cm.  The aneurysmal dilatation of his aorta appears to be stable compared to his previous CT scan in February  2011.  IMPRESSION:  The patient has stable aneurysmal dilatation of the ascending, arch, and descending thoracic and abdominal aorta.  The aneurysmal dilatation is greatest in the proximal descending thoracic aorta, but this appears stable.  Given his age and other medical problems, I would not recommend any intervention unless we see further dilatation of his aorta.  Given the extensive nature of his aneurysmal disease, I doubt that this could be treated with an endovascular stent graft.  I have recommended that we repeat his CT scan in 1 year for further followup and I will see him back at that time.  Evelene Croon, M.D. Electronically Signed  BB/MEDQ  D:  08/06/2010  T:  08/06/2010  Job:  [id number]  cc:   Nicki Guadalajara, M.D. Cleophas Dunker, M.D.

## 2010-08-22 ENCOUNTER — Other Ambulatory Visit: Payer: Self-pay | Admitting: Internal Medicine

## 2010-09-24 NOTE — Assessment & Plan Note (Signed)
OFFICE VISIT   Lee Gutierrez, Lee Gutierrez  DOB:  28-Jul-1930                                        July 10, 2009  CHART #:  [id number]   HISTORY:  The patient returned to my office today for followup status  post coronary artery bypass graft surgery x3 on June 14, 2009.  Since  discharge, he has been feeling well and is walking daily without chest  pain or shortness of breath.   PHYSICAL EXAMINATION:  His blood pressure is 133/81, pulse is 72 and  regular, respiratory rate is 18 and unlabored.  Oxygen saturation on  room air is 98%.  He looks well.  Cardiac exam shows a regular rate and  rhythm with normal heart sounds.  His lung exam is clear.  Chest  incision is healing well and the sternum is stable.  His leg incisions  are healing well.  There is mild-to-moderate right lower extremity edema  which he said has continued to improve.   DIAGNOSTIC TESTS:  Followup chest x-ray shows clear lung fields and no  pleural effusions.   MEDICATIONS:  1. Lopressor 25 mg b.i.d.  2. Aspirin 325 mg daily.  3. Vasotec 5 mg b.i.d.  4. Flomax 0.4 mg daily.  5. Digoxin 0.125 mg daily.  6. Multivitamin daily.  7. Zocor 20 mg daily.  8. Lasix 20 mg daily.  9. Potassium 10 mEq daily.  10.He is taking hydrocodone/APAP 5/500 one q.6 h. p.r.n. for pain.   IMPRESSION:  Overall, the patient is recovering well following surgery.  I encouraged him to continue walking as much as possible.  He is  planning on participating in the cardiac rehab program.  I told him he  can return to driving a car but should refrain from lifting anything  heavier than 10 pounds for total of 3 months from date of surgery.  He  does have a known chronic type B aortic dissection with a descending  thoracic aortic aneurysm with a maximum diameter about 5.6 cm.  This  will require follow up.  I have made plans to follow him up in 1 year  with CT angiogram to reevaluate his dissection and aneurysm.  He  will  otherwise follow up with Dr. Nicki Guadalajara for his cardiology care.   Evelene Croon, M.D.  Electronically Signed   BB/MEDQ  D:  07/10/2009  T:  07/11/2009  Job:  [id number]

## 2010-09-27 NOTE — Assessment & Plan Note (Signed)
[facility] OFFICE NOTE   NAME:Lee Gutierrez, Lee Gutierrez                       MRN:          [id number]  DATE:09/07/2006                            DOB:          1931-03-10    Lee Gutierrez returns today for carefule followup of his type B thoracic  aortic dissection December 2007.  I received his records from [place]  and his office followup.   He also carries a diagnosis of atrial fib.  He also has hypertension.   We increased his labetalol to 200 b.i.d. last visit.  His blood  pressures have been no higher than 130, most of them in the 110 range  systolically.  His diastolics are always good.  His heart rates run in  the 70s.   EXAMINATION:  VITAL SIGNS:  His weight is 269, down two, he looks  remarkably good.  His exam is unchanged.  NECK:  Carotids are full without bruits.  LUNGS:  Clear.  HEART:  Reveals irregular rate and rhythm without gallop.  ABDOMEN:  Soft, good bowel sounds.  EXTREMITIES:  Show no edema.   EKG shows atrial fib, nonspecific ST segment changes, secondary to dig.  No change.   ASSESSMENT/PLAN:  I have had a nice chat with Lee Gutierrez.  I discussed  his case with one of my colleagues, Dr. Jesusita Oka Benshimon.  We both feel  that aspirin  325 a day would be good for his atrial fib and reducing  thromboembolic risk.  He certainly has significant problems with  hypertension, and he is over the age of 35.   I will plan to see him back in October 2008.     Thomas C. Daleen Squibb, MD, [facility]  Electronically Signed    TCW/MedQ  DD: 09/07/2006  DT: 09/07/2006  Job #: [id number]

## 2010-09-27 NOTE — Assessment & Plan Note (Signed)
[facility] OFFICE NOTE   NAME:THURAUBenz, Vandenberghe                       MRN:          [id number]  DATE:08/26/2006                            DOB:          September 02, 1930    Mr. Wempe is a delightful 77 year old, married, white male, who comes  with his wife today to establish with [place] as his cardiology team, here in  [place], [place].  He lives most of the time in [place], [place].   I do not have any records today, but we requested them.   His history is fairly straightforward, however.  He has had hypertension  for a number of years.  He has been markedly overweight for a number of  years.  He experienced a type B aortic dissection in December.  He has  been treated medically.   His blood pressures have been really good, running around 120-130 over  about 70.  He called the office last week and his pressures were running  in the 130s and we added labetalol 100 b.i.d. and discontinued his  metoprolol.   He has had no chest pain or shortness of breath.   He also carries the diagnosis of atrial fibrillation.  He is not a  Coumadin candidate, per his physicians in [place].   PAST MEDICAL HISTORY:   CURRENT MEDICATIONS:  1. Labetalol 100 b.i.d.  2. Lasix 20 daily.  3. Potassium 10 mEq daily.  4. Digoxin 125 micrograms daily.  5. Enalapril 10 mg in the morning, 10 in the evening.  6. Flomax 0.4 daily.   He has no history of dye reaction.  He quit smoking years ago.  He does  not drink.   His wife has been extremely diligent with his food and his solid intake.  He says his taste buds are not alive right now and that has helped.  He has lost about 50 pounds since this dissection in December.   SURGICAL HISTORY:  Two knee replacements in 2003.  He has had a hernia  repair and a hydrocele.   FAMILY HISTORY:  Noncontributory.   SOCIAL HISTORY:  He is retired.  His daughter lives here, in  [place],  so they visit.  He is following a very low-salt, low-fat diet, as  mentioned above.  He has three children.   REVIEW OF SYSTEMS:  Other than his HPI, he has some problems with  bladder outlet obstruction and bladder spasms, on Flomax.  He seems to  think that change Labetalol may have affected that initially, but no  longer is a problem.  He has chronic arthritis.  Otherwise, negative.   EXAM:  He is a delightful gentleman, in no acute distress.  His blood  pressure is 122/70, his pulse 77 and regular.  EKG confirms atrial  fibrillation with some LVH and strain pattern.  His weight is 271.  He  is 5 feet 10.5.  HEENT:  Normocephalic, atraumatic.  PERRLA.  Extraocular movements  intact.  Sclerae clear.  Facial symmetry is normal.  Carotid upstrokes  are equal bilaterally,  without bruits.  There is no JVD.  Thyroid is not  enlarged.  Trachea is midline.  LUNGS:  Clear.  HEART:  Reveals a nondisplaced PMI that is poorly appreciated.  There is  a normal S1, S2 with a variable rate.  ABDOMINAL EXAM:  Soft, good bowel sounds.  No midline bruit.  There is  no hepatomegaly.  EXTREMITIES:  Reveal good pulses distally.  He has trace to 1+ pitting  edema.  There is no sign of DVT.  MUSCULOSKELETAL:  Intact.  He walks with a cane.  SKIN:  Shows a few ecchymoses.   ASSESSMENT AND PLAN:  Mr. Marquis is doing well with blood pressure  control at present.  He is in atrial fibrillation with a well-controlled  ventricular rate.   PLAN:  1. Obtain records from the outside with him following up with [place] next      week for more thorough review.  2. Continue current diet plan, losing weight and watching his salt.  3. Increase labetalol to 200 b.i.d. to slow his rate down a little      further.  He does have blood pressure on occasion that are in the      140s.  We talked about the lability of blood pressure at length      today.     Thomas C. Daleen Squibb, MD, [facility]  Electronically  Signed    TCW/MedQ  DD: 08/26/2006  DT: 08/26/2006  Job #: [id number]

## 2011-01-14 ENCOUNTER — Encounter: Payer: Self-pay | Admitting: Internal Medicine

## 2011-03-24 ENCOUNTER — Emergency Department (HOSPITAL_COMMUNITY): Payer: Medicare Other

## 2011-03-24 ENCOUNTER — Inpatient Hospital Stay (HOSPITAL_COMMUNITY)
Admission: EM | Admit: 2011-03-24 | Discharge: 2011-03-27 | DRG: 292 | Disposition: A | Discharge status: Home / Self Care | Payer: Medicare Other | Attending: Internal Medicine | Admitting: Internal Medicine

## 2011-03-24 ENCOUNTER — Other Ambulatory Visit: Payer: Self-pay

## 2011-03-24 DIAGNOSIS — E785 Hyperlipidemia, unspecified: Secondary | ICD-10-CM | POA: Diagnosis present

## 2011-03-24 DIAGNOSIS — E876 Hypokalemia: Secondary | ICD-10-CM | POA: Diagnosis not present

## 2011-03-24 DIAGNOSIS — E119 Type 2 diabetes mellitus without complications: Secondary | ICD-10-CM | POA: Diagnosis present

## 2011-03-24 DIAGNOSIS — L02219 Cutaneous abscess of trunk, unspecified: Secondary | ICD-10-CM | POA: Diagnosis present

## 2011-03-24 DIAGNOSIS — I509 Heart failure, unspecified: Secondary | ICD-10-CM

## 2011-03-24 DIAGNOSIS — I5042 Chronic combined systolic (congestive) and diastolic (congestive) heart failure: Secondary | ICD-10-CM

## 2011-03-24 DIAGNOSIS — I2510 Atherosclerotic heart disease of native coronary artery without angina pectoris: Secondary | ICD-10-CM | POA: Diagnosis present

## 2011-03-24 DIAGNOSIS — M1990 Unspecified osteoarthritis, unspecified site: Secondary | ICD-10-CM | POA: Diagnosis present

## 2011-03-24 DIAGNOSIS — L0390 Cellulitis, unspecified: Secondary | ICD-10-CM | POA: Diagnosis present

## 2011-03-24 DIAGNOSIS — Z7982 Long term (current) use of aspirin: Secondary | ICD-10-CM

## 2011-03-24 DIAGNOSIS — N400 Enlarged prostate without lower urinary tract symptoms: Secondary | ICD-10-CM | POA: Diagnosis present

## 2011-03-24 DIAGNOSIS — Z951 Presence of aortocoronary bypass graft: Secondary | ICD-10-CM

## 2011-03-24 DIAGNOSIS — I482 Chronic atrial fibrillation, unspecified: Secondary | ICD-10-CM | POA: Diagnosis present

## 2011-03-24 DIAGNOSIS — I4891 Unspecified atrial fibrillation: Secondary | ICD-10-CM | POA: Diagnosis present

## 2011-03-24 DIAGNOSIS — M19079 Primary osteoarthritis, unspecified ankle and foot: Secondary | ICD-10-CM | POA: Diagnosis present

## 2011-03-24 DIAGNOSIS — Z96659 Presence of unspecified artificial knee joint: Secondary | ICD-10-CM

## 2011-03-24 DIAGNOSIS — I10 Essential (primary) hypertension: Secondary | ICD-10-CM | POA: Diagnosis present

## 2011-03-24 DIAGNOSIS — I5033 Acute on chronic diastolic (congestive) heart failure: Principal | ICD-10-CM | POA: Diagnosis present

## 2011-03-24 DIAGNOSIS — I7100 Dissection of unspecified site of aorta: Secondary | ICD-10-CM | POA: Diagnosis present

## 2011-03-24 HISTORY — DX: Chronic combined systolic (congestive) and diastolic (congestive) heart failure: I50.42

## 2011-03-24 HISTORY — DX: Essential (primary) hypertension: I10

## 2011-03-24 HISTORY — DX: Atherosclerotic heart disease of native coronary artery without angina pectoris: I25.10

## 2011-03-24 HISTORY — DX: Reserved for inherently not codable concepts without codable children: IMO0001

## 2011-03-24 HISTORY — DX: Unspecified osteoarthritis, unspecified site: M19.90

## 2011-03-24 HISTORY — DX: Cardiac arrhythmia, unspecified: I49.9

## 2011-03-24 HISTORY — DX: Encounter for other specified aftercare: Z51.89

## 2011-03-24 HISTORY — DX: Thoracoabdominal aortic aneurysm, without rupture: I71.6

## 2011-03-24 LAB — URINALYSIS, ROUTINE W REFLEX MICROSCOPIC
Bilirubin Urine: NEGATIVE
Glucose, UA: NEGATIVE mg/dL
Hgb urine dipstick: NEGATIVE
Ketones, ur: NEGATIVE mg/dL
Protein, ur: NEGATIVE mg/dL
Urobilinogen, UA: 0.2 mg/dL (ref 0.0–1.0)

## 2011-03-24 LAB — POCT I-STAT, CHEM 8
BUN: 21 mg/dL (ref 6–23)
Calcium, Ion: 1.24 mmol/L (ref 1.12–1.32)
Creatinine, Ser: 1.1 mg/dL (ref 0.50–1.35)
Glucose, Bld: 91 mg/dL (ref 70–99)
Hemoglobin: 12.6 g/dL — ABNORMAL LOW (ref 13.0–17.0)
Sodium: 137 mEq/L (ref 135–145)
TCO2: 26 mmol/L (ref 0–100)

## 2011-03-24 LAB — CBC
HCT: 32.7 % — ABNORMAL LOW (ref 39.0–52.0)
MCHC: 34.3 g/dL (ref 30.0–36.0)
Platelets: 164 10*3/uL (ref 150–400)
RDW: 16 % — ABNORMAL HIGH (ref 11.5–15.5)
WBC: 5.5 10*3/uL (ref 4.0–10.5)

## 2011-03-24 LAB — PRO B NATRIURETIC PEPTIDE: Pro B Natriuretic peptide (BNP): 7023 pg/mL — ABNORMAL HIGH (ref 0–450)

## 2011-03-24 LAB — CARDIAC PANEL(CRET KIN+CKTOT+MB+TROPI)
CK, MB: 3.9 ng/mL (ref 0.3–4.0)
Total CK: 91 U/L (ref 7–232)
Troponin I: <0.3 ng/mL (ref <0.30)

## 2011-03-24 MED ORDER — POTASSIUM CHLORIDE CRYS ER 20 MEQ PO TBCR: 20.0000 meq | EXTENDED_RELEASE_TABLET | Freq: Two times a day (BID) | ORAL | Status: DC

## 2011-03-24 MED ORDER — POTASSIUM CHLORIDE CRYS ER 20 MEQ PO TBCR
40.0000 meq | EXTENDED_RELEASE_TABLET | Freq: Two times a day (BID) | ORAL | Status: DC
  Administered 2011-03-24 – 2011-03-27 (×6): 40 meq via ORAL
  Filled 2011-03-24 (×5): qty 2
  Filled 2011-03-24: qty 1
  Filled 2011-03-24 (×2): qty 2

## 2011-03-24 MED ORDER — DIGOXIN 0.0625 MG HALF TABLET
62.5000 ug | ORAL_TABLET | Freq: Every day | ORAL | Status: DC
  Administered 2011-03-25 – 2011-03-27 (×3): 62.5 ug via ORAL
  Filled 2011-03-24 (×3): qty 1

## 2011-03-24 MED ORDER — SODIUM CHLORIDE 0.9 % IV SOLN: 250.0000 mL | INTRAVENOUS | Status: DC

## 2011-03-24 MED ORDER — ZOLPIDEM TARTRATE 5 MG PO TABS: 5.0000 mg | ORAL_TABLET | Freq: Every evening | ORAL | Status: DC | PRN

## 2011-03-24 MED ORDER — FUROSEMIDE 10 MG/ML IJ SOLN
60.0000 mg | Freq: Two times a day (BID) | INTRAMUSCULAR | Status: DC
  Administered 2011-03-25 – 2011-03-26 (×3): 60 mg via INTRAVENOUS
  Filled 2011-03-24 (×5): qty 6

## 2011-03-24 MED ORDER — NYSTATIN 100000 UNIT/GM EX POWD: Freq: Two times a day (BID) | CUTANEOUS | Status: DC

## 2011-03-24 MED ORDER — METOPROLOL TARTRATE 100 MG PO TABS
100.0000 mg | ORAL_TABLET | Freq: Two times a day (BID) | ORAL | Status: DC
  Administered 2011-03-24 – 2011-03-27 (×6): 100 mg via ORAL
  Filled 2011-03-24 (×7): qty 1

## 2011-03-24 MED ORDER — AMLODIPINE BESYLATE 2.5 MG PO TABS: 7.5000 mg | ORAL_TABLET | Freq: Every day | ORAL | Status: DC

## 2011-03-24 MED ORDER — SODIUM CHLORIDE 0.9 % IJ SOLN: 3.0000 mL | INTRAMUSCULAR | Status: DC | PRN

## 2011-03-24 MED ORDER — SODIUM CHLORIDE 0.9 % IJ SOLN
3.0000 mL | Freq: Two times a day (BID) | INTRAMUSCULAR | Status: DC
  Administered 2011-03-25 – 2011-03-27 (×5): 3 mL via INTRAVENOUS

## 2011-03-24 MED ORDER — FUROSEMIDE 10 MG/ML IJ SOLN: 80.0000 mg | Freq: Two times a day (BID) | INTRAMUSCULAR | Status: DC

## 2011-03-24 MED ORDER — ENOXAPARIN SODIUM 40 MG/0.4ML ~~LOC~~ SOLN
40.0000 mg | SUBCUTANEOUS | Status: DC
  Administered 2011-03-24 – 2011-03-26 (×3): 40 mg via SUBCUTANEOUS
  Filled 2011-03-24 (×4): qty 0.4

## 2011-03-24 MED ORDER — SODIUM CHLORIDE 0.9 % IV SOLN
20.0000 mL | INTRAVENOUS | Status: DC
  Administered 2011-03-24: 500 mL via INTRAVENOUS

## 2011-03-24 MED ORDER — SIMVASTATIN 20 MG PO TABS
20.0000 mg | ORAL_TABLET | Freq: Every day | ORAL | Status: DC
  Administered 2011-03-25 – 2011-03-26 (×2): 20 mg via ORAL
  Filled 2011-03-24 (×3): qty 1

## 2011-03-24 MED ORDER — HYDRALAZINE HCL 20 MG/ML IJ SOLN: 10.0000 mg | Freq: Four times a day (QID) | INTRAMUSCULAR | Status: DC | PRN

## 2011-03-24 MED ORDER — TRAMADOL HCL 50 MG PO TABS
25.0000 mg | ORAL_TABLET | Freq: Four times a day (QID) | ORAL | Status: DC | PRN
  Filled 2011-03-24: qty 1

## 2011-03-24 MED ORDER — CEFAZOLIN SODIUM 1-5 GM-% IV SOLN
1.0000 g | INTRAVENOUS | Status: AC
  Administered 2011-03-24: 1 g via INTRAVENOUS
  Filled 2011-03-24: qty 50

## 2011-03-24 MED ORDER — HYDRALAZINE HCL 20 MG/ML IJ SOLN
10.0000 mg | Freq: Four times a day (QID) | INTRAMUSCULAR | Status: DC | PRN
  Filled 2011-03-24: qty 0.5

## 2011-03-24 MED ORDER — NYSTATIN 100000 UNIT/GM EX POWD
Freq: Two times a day (BID) | CUTANEOUS | Status: DC
  Administered 2011-03-24 – 2011-03-27 (×6): via TOPICAL
  Filled 2011-03-24 (×2): qty 15

## 2011-03-24 MED ORDER — ASPIRIN 325 MG PO TABS
325.0000 mg | ORAL_TABLET | Freq: Every day | ORAL | Status: DC
  Administered 2011-03-24 – 2011-03-26 (×3): 325 mg via ORAL
  Filled 2011-03-24 (×4): qty 1

## 2011-03-24 MED ORDER — TAMSULOSIN HCL 0.4 MG PO CAPS
0.4000 mg | ORAL_CAPSULE | Freq: Every day | ORAL | Status: DC
  Administered 2011-03-25 – 2011-03-27 (×3): 0.4 mg via ORAL
  Filled 2011-03-24 (×3): qty 1

## 2011-03-24 MED ORDER — CEFAZOLIN SODIUM 1-5 GM-% IV SOLN
1.0000 g | Freq: Three times a day (TID) | INTRAVENOUS | Status: DC
  Administered 2011-03-25 – 2011-03-27 (×7): 1 g via INTRAVENOUS
  Filled 2011-03-24 (×10): qty 50

## 2011-03-24 MED ORDER — ACETAMINOPHEN 325 MG PO TABS
650.0000 mg | ORAL_TABLET | ORAL | Status: DC | PRN
  Administered 2011-03-26: 650 mg via ORAL
  Filled 2011-03-24: qty 2

## 2011-03-24 MED ORDER — FUROSEMIDE 10 MG/ML IJ SOLN
80.0000 mg | Freq: Once | INTRAMUSCULAR | Status: AC
  Administered 2011-03-24: 80 mg via INTRAVENOUS
  Filled 2011-03-24: qty 8

## 2011-03-24 MED ORDER — ONDANSETRON HCL 4 MG/2ML IJ SOLN: 4.0000 mg | Freq: Four times a day (QID) | INTRAMUSCULAR | Status: DC | PRN

## 2011-03-24 MED ORDER — ENALAPRIL MALEATE 20 MG PO TABS
20.0000 mg | ORAL_TABLET | Freq: Two times a day (BID) | ORAL | Status: DC
  Administered 2011-03-24 – 2011-03-27 (×6): 20 mg via ORAL
  Filled 2011-03-24 (×7): qty 1

## 2011-03-24 NOTE — Progress Notes (Signed)
ANTIBIOTIC CONSULT NOTE - INITIAL  Pharmacy Consult for Ancef Indication: Lower abdomen cellulitis  No Known Allergies  Patient Measurements:   Adjusted Body Weight:  Vital Signs: Temp: 97 F (36.1 C) (11/12 1224) Temp src: Oral (11/12 1224) BP: 140/71 mmHg (11/12 1818) Pulse Rate: 71  (11/12 1818) Intake/Output from previous day:   Intake/Output from this shift: Total I/O In: -  Out: 1275 [Urine:1275]  Labs:  [facility] 03/24/11 1504 03/24/11 1443  WBC -- 5.5  HGB 12.6* 11.2*  PLT -- 164  LABCREA -- --  CREATININE 1.10 --   The CrCl is unknown because both a height and weight (above a minimum accepted value) are required for this calculation. No results found for this basename: VANCOTROUGH:2,VANCOPEAK:2,VANCORANDOM:2,GENTTROUGH:2,GENTPEAK:2,GENTRANDOM:2,TOBRATROUGH:2,TOBRAPEAK:2,TOBRARND:2,AMIKACINPEAK:2,AMIKACINTROU:2,AMIKACIN:2, in the last 72 hours   Microbiology: No results found for this or any previous visit (from the past 720 hour(s)).  Medical History: Past Medical History  Diagnosis Date  . Hypertension   . Aortic dissection   . Arthritis     Medications:   (Not in a hospital admission) Assessment: Patient is a 77 y.o M admitted to the ED with c/o of dyspnea and generalized edema.  To start Ancef for lower abdomen cellulitis.   Plan:  1) Ancef 1 gm IV q8h 2) Pharmacy will sign off.   Aashika Carta P 03/24/2011,6:34 PM

## 2011-03-24 NOTE — ED Notes (Signed)
Patient presents with slurred speech x 2 days, dyspnea, edema to bilateral extremities, and abdominal distention.

## 2011-03-24 NOTE — ED Provider Notes (Signed)
History     CSN: [id number] Arrival date & time: 03/24/2011 12:22 PM   First MD Initiated Contact with Patient 03/24/11 1357      Chief Complaint  Patient presents with  . Aphasia  . Leg Swelling    (Consider location/radiation/quality/duration/timing/severity/associated sxs/prior treatment) HPI  Past Medical History  Diagnosis Date  . Hypertension   . Aortic dissection   . Arthritis     Past Surgical History  Procedure Date  . Coronary artery bypass graft   . Replacement total knee     bilaterally    History reviewed. No pertinent family history.  History  Substance Use Topics  . Smoking status: Never Smoker   . Smokeless tobacco: Not on file  . Alcohol Use: No      Review of Systems  Allergies  Review of patient's allergies indicates no known allergies.  Home Medications   Current Outpatient Rx  Name Route Sig Dispense Refill  . ARTHRITIS PAIN RELIEF PO Oral Take 1 tablet by mouth daily as needed. For pain     . AMLODIPINE BESYLATE 2.5 MG PO TABS Oral Take 7.5 mg by mouth daily.      . ASPIRIN 325 MG PO TABS Oral Take 325 mg by mouth at bedtime.      Marland Kitchen DIGOXIN 0.125 MG PO TABS Oral Take 62.5 mcg by mouth daily.      . TYLENOL PM EXTRA STRENGTH PO Oral Take 2 tablets by mouth daily as needed. For pain     . ENALAPRIL MALEATE 20 MG PO TABS Oral Take 20 mg by mouth 2 (two) times daily.      Marland Kitchen METOPROLOL TARTRATE 100 MG PO TABS Oral Take 100 mg by mouth 2 (two) times daily.      . CENTRUM SILVER PO Oral Take 1 tablet by mouth daily.      Marland Kitchen POTASSIUM CHLORIDE CRYS CR 10 MEQ PO TBCR Oral Take 10 mEq by mouth daily.      Marland Kitchen SIMVASTATIN 20 MG PO TABS Oral Take 20 mg by mouth at bedtime.      . TAMSULOSIN HCL 0.4 MG PO CAPS  TAKE ONE CAPSULE BY MOUTH DAILY AFTER MEAL 90 capsule 3  . TRAMADOL HCL 50 MG PO TABS Oral Take 25-50 mg by mouth at bedtime as needed. Maximum dose= 8 tablets per day.  For pain.       BP 148/86  Pulse 55  Temp(Src) 97 F (36.1 C)  (Oral)  Resp 20  SpO2 99%  Physical Exam  ED Course  Procedures (including critical care time)  Labs Reviewed  PRO B NATRIURETIC PEPTIDE - Abnormal; Notable for the following:    BNP, POC 7023.0 (*)    All other components within normal limits  CBC - Abnormal; Notable for the following:    RBC 3.44 (*)    Hemoglobin 11.2 (*)    HCT 32.7 (*)    RDW 16.0 (*)    All other components within normal limits  POCT I-STAT, CHEM 8 - Abnormal; Notable for the following:    Hemoglobin 12.6 (*)    HCT 37.0 (*)    All other components within normal limits  CARDIAC PANEL(CRET KIN+CKTOT+MB+TROPI)  URINALYSIS, ROUTINE W REFLEX MICROSCOPIC  I-STAT, CHEM 8   Ct Head Wo Contrast  03/24/2011  *RADIOLOGY REPORT*  Clinical Data: aphasia.  Slurred speech for 2 days.  Bilateral lower extremity edema and weakness.  CT HEAD WITHOUT CONTRAST  Technique:  Contiguous axial images  were obtained from the base of the skull through the vertex without contrast.  Comparison: None.  Findings: There is diffuse patchy low density throughout the subcortical and periventricular white matter consistent with chronic small vessel ischemic change.  There is prominence of the sulci and ventricles consistent with brain atrophy.  There is no evidence for acute brain infarct, hemorrhage or mass.  The paranasal sinuses and mastoid air cells are clear.  The skull is intact.  IMPRESSION:  1.  Small vessel ischemic change and brain atrophy. 2.  No acute intracranial abnormalities.  Original Report Authenticated By: Rosealee Albee, M.D.   Dg Chest Portable 1 View  03/24/2011  *RADIOLOGY REPORT*  Clinical Data: Edema.  Shortness of breath.  Hypertension.  PORTABLE CHEST - 1 VIEW  Comparison: 04/16/2010  Findings: Thoracic aortic aneurysm and cardiomegaly noted.  Prior CABG is also noted.  Pulmonary venous hypertension is present without overt edema.  No pleural effusion is observed.  IMPRESSION:  1.  Cardiomegaly and pulmonary venous  hypertension, without overt edema. 2.  Tortuous and aneurysmal thoracic aorta.  Original Report Authenticated By: Dellia Cloud, M.D.     No diagnosis found.    MDM   Spoke with tried hospitalist. Will admit CHF. Requests holding orders. Telemetry. Team [address] [place] 03/24/11 [phone]

## 2011-03-24 NOTE — ED Notes (Signed)
Family at bedside. 

## 2011-03-24 NOTE — ED Notes (Signed)
Reports worsening BLE pitting edema 4+, SOB especially with exertion x 2-3 weeks. +orthopnea, denies CP.

## 2011-03-24 NOTE — ED Provider Notes (Signed)
History     CSN: [id number] Arrival date & time: 03/24/2011 12:22 PM   First MD Initiated Contact with Patient 03/24/11 1357      Chief Complaint  Patient presents with  . Aphasia  . Leg Swelling    (Consider location/radiation/quality/duration/timing/severity/associated sxs/prior treatment) HPI  Past Medical History  Diagnosis Date  . Hypertension   . Aortic dissection   . Arthritis     Past Surgical History  Procedure Date  . Coronary artery bypass graft   . Replacement total knee     bilaterally    History reviewed. No pertinent family history.  History  Substance Use Topics  . Smoking status: Never Smoker   . Smokeless tobacco: Not on file  . Alcohol Use: No      Review of Systems  Allergies  Review of patient's allergies indicates no known allergies.  Home Medications   Current Outpatient Rx  Name Route Sig Dispense Refill  . ARTHRITIS PAIN RELIEF PO Oral Take 1 tablet by mouth daily as needed. For pain     . AMLODIPINE BESYLATE 2.5 MG PO TABS Oral Take 7.5 mg by mouth daily.      . ASPIRIN 325 MG PO TABS Oral Take 325 mg by mouth at bedtime.      Marland Kitchen DIGOXIN 0.125 MG PO TABS Oral Take 62.5 mcg by mouth daily.      . TYLENOL PM EXTRA STRENGTH PO Oral Take 2 tablets by mouth daily as needed. For pain     . ENALAPRIL MALEATE 20 MG PO TABS Oral Take 20 mg by mouth 2 (two) times daily.      Marland Kitchen METOPROLOL TARTRATE 100 MG PO TABS Oral Take 100 mg by mouth 2 (two) times daily.      . CENTRUM SILVER PO Oral Take 1 tablet by mouth daily.      Marland Kitchen POTASSIUM CHLORIDE CRYS CR 10 MEQ PO TBCR Oral Take 10 mEq by mouth daily.      Marland Kitchen SIMVASTATIN 20 MG PO TABS Oral Take 20 mg by mouth at bedtime.      . TAMSULOSIN HCL 0.4 MG PO CAPS  TAKE ONE CAPSULE BY MOUTH DAILY AFTER MEAL 90 capsule 3  . TRAMADOL HCL 50 MG PO TABS Oral Take 25-50 mg by mouth at bedtime as needed. Maximum dose= 8 tablets per day.  For pain.       BP 148/86  Pulse 55  Temp(Src) 97 F (36.1 C)  (Oral)  Resp 20  SpO2 99%  Physical Exam  ED Course  Procedures (including critical care time)  Labs Reviewed - No data to display No results found.   No diagnosis found.    MDM    Medical screening examination/treatment/procedure(s) were conducted as a shared visit with non-physician practitioner(s) and myself.  I personally evaluated the patient during the encounter   Patient seen with Cordelia Pen, and myself. Presented with this intermittent speech problems for the past 2 days, subacute. He also had additional complaints of edema to the lower extremities and some shortness of breath. Workup initiated. Will follow          Shevon Sian A. Patrica Duel, MD 03/24/11 1429

## 2011-03-24 NOTE — ED Notes (Signed)
Patient is resting comfortably. 

## 2011-03-24 NOTE — H&P (Signed)
PCP:   Tillman Abide, MD, MD   Primary cardiologist Nicki [place]  Chief Complaint:  Exertional dyspnea and generalized edema  HPI: Patient is a 77 year old Caucasian gentleman who has a prior medical history of CHF (likely diastolic dysfunction), hypertension, diabetes what he dissection, dyslipidemia, atrial fibrillation (not on chronic Coumadin therapy, who presents to the ED with the above-noted complaints. The patient over the past month or so he has put on significant weight, he noticed that his legs slowly started getting edematous and the edema started advancing up to his thighs on. He claims that he also feels that his belly is distended as well. Per family patient has exertional dyspnea. Apparently this patient has bad osteoarthritis that he is not able to ambulate much, however even now this is limited because of shortness of breath. Patient denies any chest pain or palpitations. He claims that he is congested in his sinuses, but denies any headaches.  Review of Systems:  The patient denies anorexia, fever, weight loss,, vision loss, decreased hearing, hoarseness, chest pain, syncope,  balance deficits, hemoptysis, abdominal pain, melena, hematochezia, severe indigestion/heartburn, hematuria, incontinence, genital sores, muscle weakness, suspicious skin lesions, transient blindness, difficulty walking, depression, unusual weight change, abnormal bleeding, enlarged lymph nodes, angioedema, and breast masses.  Past Medical History: Past Medical History  Diagnosis Date  . Hypertension   . Aortic dissection   . Arthritis    Past Surgical History  Procedure Date  . Coronary artery bypass graft   . Replacement total knee     bilaterally    Medications: Prior to Admission medications   Medication Sig Start Date End Date Taking? Authorizing Provider  Acetaminophen (ARTHRITIS PAIN RELIEF PO) Take 1 tablet by mouth daily as needed. For pain    Yes Historical Provider, MD    amLODipine (NORVASC) 2.5 MG tablet Take 7.5 mg by mouth daily.     Yes Historical Provider, MD  aspirin 325 MG tablet Take 325 mg by mouth at bedtime.     Yes Historical Provider, MD  digoxin (LANOXIN) 0.125 MG tablet Take 62.5 mcg by mouth daily.     Yes Historical Provider, MD  Diphenhydramine-APAP, sleep, (TYLENOL PM EXTRA STRENGTH PO) Take 2 tablets by mouth daily as needed. For pain    Yes Historical Provider, MD  enalapril (VASOTEC) 20 MG tablet Take 20 mg by mouth 2 (two) times daily.     Yes Historical Provider, MD  metoprolol (LOPRESSOR) 100 MG tablet Take 100 mg by mouth 2 (two) times daily.     Yes Historical Provider, MD  Multiple Vitamins-Minerals (CENTRUM SILVER PO) Take 1 tablet by mouth daily.     Yes Historical Provider, MD  potassium chloride (K-DUR,KLOR-CON) 10 MEQ tablet Take 10 mEq by mouth daily.     Yes Historical Provider, MD  simvastatin (ZOCOR) 20 MG tablet Take 20 mg by mouth at bedtime.     Yes Historical Provider, MD  Tamsulosin HCl (FLOMAX) 0.4 MG CAPS TAKE ONE CAPSULE BY MOUTH DAILY AFTER MEAL 08/22/10  Yes Varney Baas, MD  traMADol (ULTRAM) 50 MG tablet Take 25-50 mg by mouth at bedtime as needed. Maximum dose= 8 tablets per day.  For pain.    Yes Historical Provider, MD    Allergies:  No Known Allergies  Social History:  reports that he has never smoked. He does not have any smokeless tobacco history on file. He reports that he does not drink alcohol or use illicit drugs.  Family History: History reviewed. No  pertinent family history.  Physical Exam: Blood pressure 146/87, pulse 57, temperature 97 F (36.1 C), temperature source Oral, resp. rate 18, SpO2 100.00%. General appearance -awake alert, speech seems mostly clear. Not in acute distress. HEENT -atraumatic normocephalic, pupils are equally reactive to light and accommodation. Neck -supple Chest -mostly bilaterally clear with the exception of a few by basilar rales. CVS -heart sounds are  irregular. Abdomen -abdomen seems slightly distended, there is some mild erythema in his lower abdomen. It is nontender with no rebound, rigidity, or guarding. Extremities -there is significant 3+ pitting edema extending up to his upper thighs. Neurology -patient is awake alert, speech is clear and he does not have any focalities on exam. Skin -no rash Wounds -N./A.  Labs on Admission:   Basename 03/24/11 1504  NA 137  K 4.3  CL 102  CO2 --  GLUCOSE 91  BUN 21  CREATININE 1.10  CALCIUM --  MG --  PHOS --   No results found for this basename: AST:2,ALT:2,ALKPHOS:2,BILITOT:2,PROT:2,ALBUMIN:2 in the last 72 hours No results found for this basename: LIPASE:2,AMYLASE:2 in the last 72 hours  Basename 03/24/11 1504 03/24/11 1443  WBC -- 5.5  NEUTROABS -- --  HGB 12.6* 11.2*  HCT 37.0* 32.7*  MCV -- 95.1  PLT -- 164    Basename 03/24/11 1444  CKTOTAL 91  CKMB 3.9  CKMBINDEX --  TROPONINI <0.30   No results found for this basename: TSH,T4TOTAL,FREET3,T3FREE,THYROIDAB in the last 72 hours No results found for this basename: VITAMINB12:2,FOLATE:2,FERRITIN:2,TIBC:2,IRON:2,RETICCTPCT:2 in the last 72 hours  Radiological Exams on Admission: Ct Head Wo Contrast  03/24/2011  *RADIOLOGY REPORT*  Clinical Data: aphasia.  Slurred speech for 2 days.  Bilateral lower extremity edema and weakness.  CT HEAD WITHOUT CONTRAST  Technique:  Contiguous axial images were obtained from the base of the skull through the vertex without contrast.  Comparison: None.  Findings: There is diffuse patchy low density throughout the subcortical and periventricular white matter consistent with chronic small vessel ischemic change.  There is prominence of the sulci and ventricles consistent with brain atrophy.  There is no evidence for acute brain infarct, hemorrhage or mass.  The paranasal sinuses and mastoid air cells are clear.  The skull is intact.  IMPRESSION:  1.  Small vessel ischemic change and brain  atrophy. 2.  No acute intracranial abnormalities.  Original Report Authenticated By: Rosealee Albee, M.D.   Dg Chest Portable 1 View  03/24/2011  *RADIOLOGY REPORT*  Clinical Data: Edema.  Shortness of breath.  Hypertension.  PORTABLE CHEST - 1 VIEW  Comparison: 04/16/2010  Findings: Thoracic aortic aneurysm and cardiomegaly noted.  Prior CABG is also noted.  Pulmonary venous hypertension is present without overt edema.  No pleural effusion is observed.  IMPRESSION:  1.  Cardiomegaly and pulmonary venous hypertension, without overt edema. 2.  Tortuous and aneurysmal thoracic aorta.  Original Report Authenticated By: Dellia Cloud, M.D.    Assessment/Plan Present on Admission:  .CHF, acute on chronic -This likely represents acute diastolic heart failure, patient claims that he has put on around 15 pounds over the past one month. He has significant pitting edema in his lower extremities bilaterally up to his upper thighs. -We'll go and place him on intravenous Lasix, we will continue his Lopressor and enalapril at his usual doses. -EDP has already consulted [facility] and vascular cardiology, we will await their evaluation as well. -Daily weights and strict I&O's will be done.  .AORTIC DISSECTION -Patient has a history  of type B aortic dissection. -We will attempt to control his blood pressure tightly.  .Atrial fibrillation -This is a chronic issue, currently rate controlled with digoxin and and metoprolol. -He is currently not on Coumadin, but has been maintained on aspirin   .BENIGN PROSTATIC HYPERTROPHY -Continue Flomax   .CORONARY ARTERY DISEASE -Continue with aspirin, statin and Lopressor. -Patient is status post CABG  -Will start cycle cardiac enzymes.  Marland KitchenHYPERLIPIDEMIA -Continue  Statin.  Marland KitchenHYPERTENSION -Continue with Lopressor and enalapril. -Blood pressure seems pretty well controlled currently.   .OSTEOARTHRITIS -Used tramadol as needed.   .Cellulitis -He  does appear to have mild cellulitic changes in his lower abdomen, we will treat him with Ancef and the statin.   CODE STATUS  -Wishes to be a DO NOT RESUSCITATE. Please note that this was confirmed with wife and other family members at bedside.  Total time spent for admission 45 minutes.  Jeoffrey Massed 03/24/2011, 6:08 PM

## 2011-03-24 NOTE — ED Notes (Signed)
Returned from CT scan. Family at bedside. NAD. No voiced complaints

## 2011-03-24 NOTE — ED Notes (Signed)
ekg was done at 14:24pm.  Both have been given to dr. Patrica Duel. (new and old from [place])

## 2011-03-24 NOTE — ED Provider Notes (Signed)
History     CSN: [id number] Arrival date & time: 03/24/2011 12:22 PM   First MD Initiated Contact with Patient 03/24/11 1357      Chief Complaint  Patient presents with  . Aphasia  . Leg Swelling    (Consider location/radiation/quality/duration/timing/severity/associated sxs/prior treatment) Patient is a 77 y.o. male presenting with shortness of breath. The history is provided by the patient and the spouse.  Shortness of Breath  The current episode started 5 to 7 days ago. The problem occurs frequently. The problem has been gradually worsening. The problem is moderate. The symptoms are relieved by rest. The symptoms are aggravated by activity. Associated symptoms include shortness of breath. Pertinent negatives include no chest pain, no fever and no cough. Associated symptoms comments: The patient has also had increasing bilateral lower extremity edema. He takes Lasix 40 mg daily and has had no change in this medication. No chest pain. Per his wife, he also has brief episodes of slurred speech, and "unable to get the words out". No facial asymmetry, or lateralized weakness.. Past medical history comments: History of CHF, unrepairable aortic dissection, irregular heart rate, CAD.Marland Kitchen He has been behaving normally.    Past Medical History  Diagnosis Date  . Hypertension   . Aortic dissection   . Arthritis     Past Surgical History  Procedure Date  . Coronary artery bypass graft   . Replacement total knee     bilaterally    History reviewed. No pertinent family history.  History  Substance Use Topics  . Smoking status: Never Smoker   . Smokeless tobacco: Not on file  . Alcohol Use: No      Review of Systems  Constitutional: Negative.  Negative for fever and chills.  HENT: Negative.   Respiratory: Positive for shortness of breath. Negative for cough.   Cardiovascular: Positive for leg swelling. Negative for chest pain.  Gastrointestinal: Negative.   Musculoskeletal:  Negative.   Skin: Negative.   Neurological: Negative.     Allergies  Review of patient's allergies indicates no known allergies.  Home Medications   Current Outpatient Rx  Name Route Sig Dispense Refill  . ARTHRITIS PAIN RELIEF PO Oral Take 1 tablet by mouth daily as needed. For pain     . AMLODIPINE BESYLATE 2.5 MG PO TABS Oral Take 7.5 mg by mouth daily.      . ASPIRIN 325 MG PO TABS Oral Take 325 mg by mouth at bedtime.      Marland Kitchen DIGOXIN 0.125 MG PO TABS Oral Take 62.5 mcg by mouth daily.      . TYLENOL PM EXTRA STRENGTH PO Oral Take 2 tablets by mouth daily as needed. For pain     . ENALAPRIL MALEATE 20 MG PO TABS Oral Take 20 mg by mouth 2 (two) times daily.      Marland Kitchen METOPROLOL TARTRATE 100 MG PO TABS Oral Take 100 mg by mouth 2 (two) times daily.      . CENTRUM SILVER PO Oral Take 1 tablet by mouth daily.      Marland Kitchen POTASSIUM CHLORIDE CRYS CR 10 MEQ PO TBCR Oral Take 10 mEq by mouth daily.      Marland Kitchen SIMVASTATIN 20 MG PO TABS Oral Take 20 mg by mouth at bedtime.      . TAMSULOSIN HCL 0.4 MG PO CAPS  TAKE ONE CAPSULE BY MOUTH DAILY AFTER MEAL 90 capsule 3  . TRAMADOL HCL 50 MG PO TABS Oral Take 25-50 mg by mouth at  bedtime as needed. Maximum dose= 8 tablets per day.  For pain.       BP 122/68  Pulse 53  Temp(Src) 97 F (36.1 C) (Oral)  Resp 18  SpO2 98%  Physical Exam  Constitutional: He is oriented to person, place, and time. He appears well-developed and well-nourished.  HENT:  Head: Normocephalic.  Neck: Normal range of motion. Neck supple.  Cardiovascular: An irregularly irregular rhythm present. Bradycardia present.   Pulmonary/Chest:       Short respiratory cycle, diffuse but very mild rales. No respiratory distress.  Abdominal: Soft. Bowel sounds are normal. There is no tenderness. There is no rebound and no guarding.  Musculoskeletal: He exhibits edema.  Neurological: He is alert and oriented to person, place, and time. No cranial nerve deficit or sensory deficit. He  exhibits normal muscle tone. Coordination normal.  Skin: Skin is warm and dry. No rash noted.  Psychiatric: He has a normal mood and affect.    ED Course  Procedures (including critical care time)  Labs Reviewed - No data to display No results found.   No diagnosis found.    MDM  Patient has remarkable SOB with any ambulation with intermittent slurred speech episodes. He has significant bilateral lower extremity pitting edema. Anticipate admission when lab results returned.  Rodena Medin, PA 03/24/11 1433

## 2011-03-24 NOTE — ED Notes (Signed)
Family at bedside. Admitting MD at bedside.

## 2011-03-24 NOTE — Consult Note (Signed)
Reason for Consult: Acute on chronic systolic/diastolic heart failure. Referring Physician: [facility]   Lee Gutierrez is an 77 y.o. male.    Chief Complaint: Significant shortness of breath unable to ambulate HPI: 77 year old white married male patient of Dr. Tresa Endo, presents to the emergency room today with significant shortness of breath. This shortness of breath has occurred over the last one to one and Gutierrez half weeks there the last 2 or 3 days it has increased her more rapid pace. He denies any chest pain. On he is significantly short of breath with any exertion even sitting up will cause him to be short of breath. Infrequently in the late afternoon his speech is affected and somewhat florid. He also states his urinary output has decreased over the last 2 days it just isn't feeling continuous bladder. His edema had increased he was having Gutierrez lot of thigh edema as well as into his abdomen.  Both the patient and his wife deny that he has had any increased salt intake he sticks to Gutierrez pretty regimented diet.  Here in the emergency room and given 80 mg of IV Lasix and is   -1275 cc. He does feel better  but continues with excessive edema and shortness of breath with any exertion.  Most recent 2D echo was 01/06/11, EF was 40-45%, LA dilated with LA volume/BSA=26ml/m2. RV systolic 50-60 mmHg.  Moderate Pul Htn.  Mild aortic root dilatation.Suggestion of impaired LV relaxation.  We agree with the Hospitalist his decision to admit for further diuresing.   Past Medical History  Diagnosis Date  . Hypertension   . Arthritis   . Coronary artery disease   . Dysrhythmia   . 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  Marland Kitchen Shortness of breath   . Aortic dissection     Past Surgical History  Procedure Date  . Coronary artery bypass graft   . Replacement total knee     bilaterally    History reviewed. No pertinent family history. Social History:  reports that he has  never smoked. He does not have any smokeless tobacco history on file. He reports that he does not drink alcohol or use illicit drugs.  Allergies: No Known Allergies  Medications Prior to Admission  Medication Dose Route Frequency Provider Last Rate Last Dose  . 0.9 %  sodium chloride infusion  20 mL Intravenous Continuous Rodena Medin, PA 20 mL/hr at 03/24/11 1555 500 mL at 03/24/11 1555  . ceFAZolin (ANCEF) IVPB 1 g/50 mL premix  1 g Intravenous To Major Anh P Pham, PHARMD      . furosemide (LASIX) injection 80 mg  80 mg Intravenous Once Rodena Medin, PA   80 mg at 03/24/11 1555  . DISCONTD: hydrALAZINE (APRESOLINE) injection 10 mg  10 mg Intravenous Q6H PRN Shanker Ghimire      . DISCONTD: nystatin (NYSTOP) topical powder   Topical BID Shanker Ghimire       Medications Prior to Admission  Medication Sig Dispense Refill  . Tamsulosin HCl (FLOMAX) 0.4 MG CAPS TAKE ONE CAPSULE BY MOUTH DAILY AFTER MEAL  90 capsule  3    Results for orders placed during the hospital encounter of 03/24/11 (from the past 48 hour(s))  PRO B NATRIURETIC PEPTIDE     Status: Abnormal   Collection Time   03/24/11  2:43 PM      Component Value Range Comment   BNP, POC 7023.0 (*) 0 - 450 (pg/mL)  CBC     Status: Abnormal   Collection Time   03/24/11  2:43 PM      Component Value Range Comment   WBC 5.5  4.0 - 10.5 (K/uL)    RBC 3.44 (*) 4.22 - 5.81 (MIL/uL)    Hemoglobin 11.2 (*) 13.0 - 17.0 (g/dL)    HCT 45.4 (*) 09.8 - 52.0 (%)    MCV 95.1  78.0 - 100.0 (fL)    MCH 32.6  26.0 - 34.0 (pg)    MCHC 34.3  30.0 - 36.0 (g/dL)    RDW 11.9 (*) 14.7 - 15.5 (%)    Platelets 164  150 - 400 (K/uL)   CARDIAC PANEL(CRET KIN+CKTOT+MB+TROPI)     Status: Normal   Collection Time   03/24/11  2:44 PM      Component Value Range Comment   Total CK 91  7 - 232 (U/L)    CK, MB 3.9  0.3 - 4.0 (ng/mL)    Troponin I <0.30  <0.30 (ng/mL)    Relative Index RELATIVE INDEX IS INVALID  0.0 - 2.5    POCT I-STAT, CHEM 8      Status: Abnormal   Collection Time   03/24/11  3:04 PM      Component Value Range Comment   Sodium 137  135 - 145 (mEq/L)    Potassium 4.3  3.5 - 5.1 (mEq/L)    Chloride 102  96 - 112 (mEq/L)    BUN 21  6 - 23 (mg/dL)    Creatinine, Ser 8.29  0.50 - 1.35 (mg/dL)    Glucose, Bld 91  70 - 99 (mg/dL)    Calcium, Ion 5.62  1.12 - 1.32 (mmol/L)    TCO2 26  0 - 100 (mmol/L)    Hemoglobin 12.6 (*) 13.0 - 17.0 (g/dL)    HCT 13.0 (*) 86.5 - 52.0 (%)   URINALYSIS, ROUTINE W REFLEX MICROSCOPIC     Status: Normal   Collection Time   03/24/11  4:19 PM      Component Value Range Comment   Color, Urine YELLOW  YELLOW     Appearance CLEAR  CLEAR     Specific Gravity, Urine 1.009  1.005 - 1.030     pH 6.5  5.0 - 8.0     Glucose, UA NEGATIVE  NEGATIVE (mg/dL)    Hgb urine dipstick NEGATIVE  NEGATIVE     Bilirubin Urine NEGATIVE  NEGATIVE     Ketones, ur NEGATIVE  NEGATIVE (mg/dL)    Protein, ur NEGATIVE  NEGATIVE (mg/dL)    Urobilinogen, UA 0.2  0.0 - 1.0 (mg/dL)    Nitrite NEGATIVE  NEGATIVE     Leukocytes, UA NEGATIVE  NEGATIVE  MICROSCOPIC NOT DONE ON URINES WITH NEGATIVE PROTEIN, BLOOD, LEUKOCYTES, NITRITE, OR GLUCOSE <1000 mg/dL.   Ct Head Wo Contrast  03/24/2011  *RADIOLOGY REPORT*  Clinical Data: aphasia.  Slurred speech for 2 days.  Bilateral lower extremity edema and weakness.  CT HEAD WITHOUT CONTRAST  Technique:  Contiguous axial images were obtained from the base of the skull through the vertex without contrast.  Comparison: None.  Findings: There is diffuse patchy low density throughout the subcortical and periventricular white matter consistent with chronic small vessel ischemic change.  There is prominence of the sulci and ventricles consistent with brain atrophy.  There is no evidence for acute brain infarct, hemorrhage or mass.  The paranasal sinuses and mastoid air cells are clear.  The skull is intact.  IMPRESSION:  1.  Small vessel ischemic change and brain atrophy. 2.  No acute  intracranial abnormalities.  Original Report Authenticated By: Rosealee Albee, M.D.   Dg Chest Portable 1 View  03/24/2011  *RADIOLOGY REPORT*  Clinical Data: Edema.  Shortness of breath.  Hypertension.  PORTABLE CHEST - 1 VIEW  Comparison: 04/16/2010  Findings: Thoracic aortic aneurysm and cardiomegaly noted.  Prior CABG is also noted.  Pulmonary venous hypertension is present without overt edema.  No pleural effusion is observed.  IMPRESSION:  1.  Cardiomegaly and pulmonary venous hypertension, without overt edema. 2.  Tortuous and aneurysmal thoracic aorta.  Original Report Authenticated By: Dellia Cloud, M.D.    ROSDicky Doe. Increasing weight, and increasing edema. Skin: No weeping of his lower extremities despite the edema. HEENT: No blurred vision. Cardiovascular: Denies chest pain. Pulmonary: increasing shortness of breath. GI: 2 months ago he had some bright blood in his stool he was on nonsteroidal he  stopped taking it.  He has had no further bleeding. Denies any constipation or diarrhea. GU: Denies hematuria. Has had decreased urinary output especially the last 2 days. Neuro: On some splurge speech in the afternoons especially. No lightheadedness and no syncope.   Blood pressure 144/78, pulse 76, temperature 97 F (36.1 C), temperature source Oral, resp. rate 21, SpO2 99.00%. PE:  General: Alert, oriented, white male with dyspnea with any exertion. Currently no acute distress and pleasant affect. Skin: Warm and dry brisk capillary refill. HEENT: Normocephalic, sclera clear. Neck: Supple, no JVD, no carotid bruits. Heart: S1-S2, irregularly irregular but controlled rate no harsh murmurs. Lungs: Rales one third up bilaterally, upper airway wheezing. Abdomen: Obese soft nontender positive bowel sounds, and do not palpate liver spleen or masses. Extremities 2+ pitting edema into his hips on the inner thighs is more posterior. Pedal pulses are present. Neuro: Alert oriented  x3, moves all extremities follows commands.    Assessment/Plan Patient Active Problem List  Diagnoses  . HYPERLIPIDEMIA  . HYPERTENSION  . CORONARY ARTERY DISEASE  . Atrial fibrillation  . AORTIC DISSECTION  . VENTRAL HERNIA  . BENIGN PROSTATIC HYPERTROPHY  . OSTEOARTHRITIS  . CHF, acute on chronic  . Cellulitis    PLAN:  Agree with admission diuresing see Dr. Landry Dyke note.  INGOLD,Lee Gutierrez 03/24/2011, 8:56 PM   Patient seen and examined. Agree with assessment and plan. Pt well known to me.  He has Gutierrez hx of chronic Gutierrez Fib, type III aortic dissection, and is s/p CABG 06/2010.  He presents now with increased peripheral edema and Gutierrez 15 lb wt gain over the past 2 weeks. Agree with initial Lasix 80 mg IV and recommend 80 mg IV every 12 hours for 4 doses. Need to administer KCl with aggressive diuresis, check MG.  Will hold amlodipine. EF 40 -45% on echo in 12/2010. Will add spironolactone at 12.5 mg bid. F/U BNP, CMET, CBC in am. KELLY,Lee Gutierrez 03/24/2011 9:39 PM

## 2011-03-25 ENCOUNTER — Inpatient Hospital Stay (HOSPITAL_COMMUNITY): Payer: Medicare Other

## 2011-03-25 ENCOUNTER — Encounter (HOSPITAL_COMMUNITY): Payer: Self-pay | Admitting: General Practice

## 2011-03-25 ENCOUNTER — Other Ambulatory Visit: Payer: Self-pay

## 2011-03-25 LAB — CREATININE, SERUM
Creatinine, Ser: 0.93 mg/dL (ref 0.50–1.35)
GFR calc non Af Amer: 78 mL/min — ABNORMAL LOW (ref >90)

## 2011-03-25 LAB — CARDIAC PANEL(CRET KIN+CKTOT+MB+TROPI)
CK, MB: 2.7 ng/mL (ref 0.3–4.0)
Relative Index: INVALID (ref 0.0–2.5)
Total CK: 59 U/L (ref 7–232)
Troponin I: <0.3 ng/mL (ref <0.30)
Troponin I: <0.3 ng/mL (ref <0.30)

## 2011-03-25 LAB — CBC
Hemoglobin: 11.2 g/dL — ABNORMAL LOW (ref 13.0–17.0)
MCHC: 33.9 g/dL (ref 30.0–36.0)
Platelets: 171 10*3/uL (ref 150–400)
RBC: 3.36 MIL/uL — ABNORMAL LOW (ref 4.22–5.81)
RDW: 15.9 % — ABNORMAL HIGH (ref 11.5–15.5)
WBC: 5.8 10*3/uL (ref 4.0–10.5)
WBC: 5 10*3/uL (ref 4.0–10.5)

## 2011-03-25 LAB — DIGOXIN LEVEL: Digoxin Level: 0.3 ng/mL — ABNORMAL LOW (ref 0.8–2.0)

## 2011-03-25 LAB — BASIC METABOLIC PANEL
BUN: 17 mg/dL (ref 6–23)
Calcium: 9.3 mg/dL (ref 8.4–10.5)
Creatinine, Ser: 0.82 mg/dL (ref 0.50–1.35)
GFR calc Af Amer: >90 mL/min (ref >90)
GFR calc non Af Amer: 83 mL/min — ABNORMAL LOW (ref >90)
Potassium: 3.7 mEq/L (ref 3.5–5.1)

## 2011-03-25 MED ORDER — FUROSEMIDE 10 MG/ML IJ SOLN
INTRAMUSCULAR | Status: AC
  Administered 2011-03-25: 60 mg via INTRAVENOUS
  Filled 2011-03-25: qty 8

## 2011-03-25 NOTE — Progress Notes (Signed)
Subjective:  Less SOB  Objective:  Vital Signs in the last 24 hours: Temp:  [97 F (36.1 C)-98.2 F (36.8 C)] 97.6 F (36.4 C) (11/13 0510) Pulse Rate:  [53-79] 70  (11/13 0510) Resp:  [18-22] 22  (11/13 0510) BP: (115-150)/(64-98) 143/70 mmHg (11/13 0510) SpO2:  [96 %-100 %] 96 % (11/13 0510) FiO2 (%):  [2 %] 2 % (11/13 0510) Weight:  [113.9 kg (251 lb 1.7 oz)-115.1 kg (253 lb 12 oz)] 251 lb 1.7 oz (113.9 kg) (11/13 0510)  Intake/Output from previous day: 11/12 0701 - 11/13 0700 In: 106 [IV Piggyback:106] Out: 4875 [Urine:4875]  Physical Exam: Lungs: clear to auscultation bilaterally Heart: irregularly irregular rhythm and systolic murmur: holosystolic 1/6, low pitch at lower left sternal border Extremities: edema 2-3+ bilat LE edema   Rate: 80  Rhythm: atrial fibrillation  Lab Results:  Basename 03/25/11 0622 03/25/11 0120  WBC 5.0 5.8  HGB 10.8* 11.2*  PLT 171 170    Basename 03/25/11 0622 03/25/11 0120 03/24/11 1504  NA 139 -- 137  K 3.7 -- 4.3  CL 102 -- 102  CO2 27 -- --  GLUCOSE 85 -- 91  BUN 17 -- 21  CREATININE 0.82 0.93 --    Basename 03/25/11 0121 03/24/11 1444  TROPONINI <0.30 <0.30   Hepatic Function Panel No results found for this basename: PROT,ALBUMIN,AST,ALT,ALKPHOS,BILITOT,BILIDIR,IBILI in the last 72 hours No results found for this basename: CHOL in the last 72 hours No results found for this basename: PROTIME in the last 72 hours  Imaging: Ct Head Wo Contrast  03/24/2011  *RADIOLOGY REPORT*  Clinical Data: aphasia.  Slurred speech for 2 days.  Bilateral lower extremity edema and weakness.  CT HEAD WITHOUT CONTRAST  Technique:  Contiguous axial images were obtained from the base of the skull through the vertex without contrast.  Comparison: None.  Findings: There is diffuse patchy low density throughout the subcortical and periventricular white matter consistent with chronic small vessel ischemic change.  There is prominence of the sulci  and ventricles consistent with brain atrophy.  There is no evidence for acute brain infarct, hemorrhage or mass.  The paranasal sinuses and mastoid air cells are clear.  The skull is intact.  IMPRESSION:  1.  Small vessel ischemic change and brain atrophy. 2.  No acute intracranial abnormalities.  Original Report Authenticated By: Rosealee Albee, M.D.   Dg Chest Port 1 View  03/25/2011  *RADIOLOGY REPORT*  Clinical Data: Shortness of breath  PORTABLE CHEST - 1 VIEW  Comparison: 03/24/2011  Findings:  The patient is status post median sternotomy and CABG procedure.  Heart size is moderately enlarged.  There is no pleural effusion or pulmonary edema.  Coarsened interstitial markings are noted bilaterally.  There is plate-like atelectasis noted within the lung bases.  IMPRESSION:  1.  Cardiac enlargement.  No heart failure.  Original Report Authenticated By: Rosealee Albee, M.D.   Dg Chest Portable 1 View  03/24/2011  *RADIOLOGY REPORT*  Clinical Data: Edema.  Shortness of breath.  Hypertension.  PORTABLE CHEST - 1 VIEW  Comparison: 04/16/2010  Findings: Thoracic aortic aneurysm and cardiomegaly noted.  Prior CABG is also noted.  Pulmonary venous hypertension is present without overt edema.  No pleural effusion is observed.  IMPRESSION:  1.  Cardiomegaly and pulmonary venous hypertension, without overt edema. 2.  Tortuous and aneurysmal thoracic aorta.  Original Report Authenticated By: Dellia Cloud, M.D.    Cardiac Studies:  Assessment/Plan:   Principal Problem:  *  CHF, acute on chronic diastolic, EF 40-45% Active Problems: CAD-CABG X3 Feb 2011 CAF, no Coumadin secondary to type B AO disection PVD-chronic type B AO dessection. Pulm HTN- PA 50-26mmhg BPH DJD, s/p bilt TKR Dyslipidemia  Plan- Continue to diurese. MD to see.       Corine Shelter PA-C 03/25/2011, 9:14 AM    Disc. With family. chf improving slowly   LITTLE, ALFRED B

## 2011-03-25 NOTE — Progress Notes (Signed)
Pt's HR dropped to 38 BPM non sustained. Asymptomatic. Spoke with DR. Crosley, no new orders given.

## 2011-03-25 NOTE — Progress Notes (Signed)
Subjective: Breathing better, mild decrease in leg swelling. Otherwise no major events overnight.  Objective: Vital signs in last 24 hours: Temp:  [97 F (36.1 C)-98.5 F (36.9 C)] 98.5 F (36.9 C) (11/13 1056) Pulse Rate:  [53-79] 65  (11/13 1056) Resp:  [18-22] 18  (11/13 1056) BP: (115-150)/(64-98) 118/73 mmHg (11/13 1056) SpO2:  [96 %-100 %] 96 % (11/13 1056) FiO2 (%):  [2 %] 2 % (11/13 0510) Weight:  [113.9 kg (251 lb 1.7 oz)-115.1 kg (253 lb 12 oz)] 251 lb 1.7 oz (113.9 kg) (11/13 0510) Weight change:  Body mass index is 37.08 kg/(m^2).  Intake/Output from previous day: 11/12 0701 - 11/13 0700 In: 106 [IV Piggyback:106] Out: 4875 [Urine:4875]   PHYSICAL EXAM: Gen Exam: Awake and alert with clear speech.   Neck: Supple, No JVD.   Chest: B/L by basilar rales but otherwise clear. CVS: S1 S2 irregular, no murmurs.  Abdomen: soft, BS +, non tender, non distended. Significantly decreased erythema in the lower abdomen. Extremities: 2+ pitting bilateral edema in lower extremity, legs warm to touch.  Neurologic: Non Focal.   Skin: No Rash.   Wounds: N/A.    Lab Results:  Basename 03/25/11 0622 03/25/11 0120  WBC 5.0 5.8  HGB 10.8* 11.2*  HCT 31.9* 32.0*  PLT 171 170   CMET CMP     Component Value Date/Time   NA 139 03/25/2011 0622   K 3.7 03/25/2011 0622   CL 102 03/25/2011 0622   CO2 27 03/25/2011 0622   GLUCOSE 85 03/25/2011 0622   BUN 17 03/25/2011 0622   CREATININE 0.82 03/25/2011 0622   CALCIUM 9.3 03/25/2011 0622   PROT 6.1 01/28/2010 1257   ALBUMIN 3.9 01/28/2010 1257   AST 24 01/28/2010 1257   ALT 22 01/28/2010 1257   ALKPHOS 62 01/28/2010 1257   BILITOT 0.7 01/28/2010 1257   GFRNONAA 83* 03/25/2011 0622   GFRAA >90 03/25/2011 0622    LIPIDS @LASTLIPID @ @LASTBNP @ COAGS No results found for this basename: PT:2,INR:2 in the last 72 hours MICROBIOLOGY: No results found for this or any previous visit (from the past 240  hour(s)).  Studies/Results: Ct Head Wo Contrast  03/24/2011  *RADIOLOGY REPORT*  Clinical Data: aphasia.  Slurred speech for 2 days.  Bilateral lower extremity edema and weakness.  CT HEAD WITHOUT CONTRAST  Technique:  Contiguous axial images were obtained from the base of the skull through the vertex without contrast.  Comparison: None.  Findings: There is diffuse patchy low density throughout the subcortical and periventricular white matter consistent with chronic small vessel ischemic change.  There is prominence of the sulci and ventricles consistent with brain atrophy.  There is no evidence for acute brain infarct, hemorrhage or mass.  The paranasal sinuses and mastoid air cells are clear.  The skull is intact.  IMPRESSION:  1.  Small vessel ischemic change and brain atrophy. 2.  No acute intracranial abnormalities.  Original Report Authenticated By: Rosealee Albee, M.D.   Dg Chest Port 1 View  03/25/2011  *RADIOLOGY REPORT*  Clinical Data: Shortness of breath  PORTABLE CHEST - 1 VIEW  Comparison: 03/24/2011  Findings:  The patient is status post median sternotomy and CABG procedure.  Heart size is moderately enlarged.  There is no pleural effusion or pulmonary edema.  Coarsened interstitial markings are noted bilaterally.  There is plate-like atelectasis noted within the lung bases.  IMPRESSION:  1.  Cardiac enlargement.  No heart failure.  Original Report Authenticated By: Simonne Martinet.  Bradly Chris, M.D.   Dg Chest Portable 1 View  03/24/2011  *RADIOLOGY REPORT*  Clinical Data: Edema.  Shortness of breath.  Hypertension.  PORTABLE CHEST - 1 VIEW  Comparison: 04/16/2010  Findings: Thoracic aortic aneurysm and cardiomegaly noted.  Prior CABG is also noted.  Pulmonary venous hypertension is present without overt edema.  No pleural effusion is observed.  IMPRESSION:  1.  Cardiomegaly and pulmonary venous hypertension, without overt edema. 2.  Tortuous and aneurysmal thoracic aorta.  Original Report  Authenticated By: Dellia Cloud, M.D.    Medications:  Scheduled:   . aspirin  325 mg Oral QHS  . ceFAZolin (ANCEF) IV  1 g Intravenous To Major  . ceFAZolin (ANCEF) IV  1 g Intravenous Q8H  . digoxin  62.5 mcg Oral Daily  . enalapril  20 mg Oral BID  . enoxaparin  40 mg Subcutaneous Q24H  . furosemide  60 mg Intravenous Q12H  . furosemide  80 mg Intravenous Once  . metoprolol  100 mg Oral BID  . nystatin   Topical BID  . potassium chloride  40 mEq Oral BID  . simvastatin  20 mg Oral QHS  . sodium chloride  3 mL Intravenous Q12H  . Tamsulosin HCl  0.4 mg Oral Daily  . DISCONTD: amLODipine  7.5 mg Oral Daily  . DISCONTD: furosemide  80 mg Intravenous Q12H  . DISCONTD: nystatin   Topical BID  . DISCONTD: potassium chloride  20 mEq Oral BID    Assessment/Plan: Principal Problem:  *CHF, acute on chronic-likely combined diastolic and systolic dysfunction. -Slightly better, however still overloaded. Has lost around 20 pounds overnight, diuresing well.  -Continue with Lasix. Metoprolol and enalapril.  Active Problems:  HYPERLIPIDEMIA -Continue with Zocor   HYPERTENSION -Controlled with Lasix, metoprolol and enalapril.   CORONARY ARTERY DISEASE -Cardiac enzymes negative. -Continue with aspirin, statin and beta blockers.   Atrial fibrillation -Rate controlled with digoxin and metoprolol. -Not on Coumadin chronically, maintained on aspirin.   AORTIC DISSECTION-type B -Blood pressure pretty well controlled, continue with current medications. -Monitor BP    BENIGN PROSTATIC HYPERTROPHY -Continue Flomax.   Cellulitis-lower abdominal wall -Continue with Ancef and nystatin.   OSTEOARTHRITIS -As needed tramadol.  Obtain PT eval.  CODE STATUS -DO NOT RESUSCITATE.  Disposition -Remain inpatient.   Maretta Bees, MD. 03/25/2011, 12:08 PM

## 2011-03-26 DIAGNOSIS — I272 Pulmonary hypertension, unspecified: Secondary | ICD-10-CM | POA: Insufficient documentation

## 2011-03-26 LAB — BASIC METABOLIC PANEL
BUN: 15 mg/dL (ref 6–23)
CO2: 28 mEq/L (ref 19–32)
Calcium: 9.4 mg/dL (ref 8.4–10.5)
GFR calc non Af Amer: 84 mL/min — ABNORMAL LOW (ref >90)
Glucose, Bld: 91 mg/dL (ref 70–99)
Potassium: 3.2 mEq/L — ABNORMAL LOW (ref 3.5–5.1)

## 2011-03-26 MED ORDER — FUROSEMIDE 10 MG/ML IJ SOLN
40.0000 mg | Freq: Two times a day (BID) | INTRAMUSCULAR | Status: DC
  Administered 2011-03-26 – 2011-03-27 (×2): 40 mg via INTRAVENOUS
  Filled 2011-03-26 (×2): qty 4

## 2011-03-26 MED ORDER — POTASSIUM CHLORIDE CRYS ER 20 MEQ PO TBCR
20.0000 meq | EXTENDED_RELEASE_TABLET | Freq: Once | ORAL | Status: AC
  Administered 2011-03-26: 20 meq via ORAL

## 2011-03-26 MED ORDER — POTASSIUM CHLORIDE CRYS ER 20 MEQ PO TBCR
20.0000 meq | EXTENDED_RELEASE_TABLET | Freq: Once | ORAL | Status: AC
  Administered 2011-03-26: 20 meq via ORAL
  Filled 2011-03-26: qty 1

## 2011-03-26 NOTE — Plan of Care (Signed)
Problem: Discharge Progression Outcomes Goal: Activity appropriate for discharge plan Outcome: Progressing PT eval completed, will follow pt acutely but do not anticipate pt needing PT f/u at d/c.  No equip needs.

## 2011-03-26 NOTE — Progress Notes (Signed)
Subjective: Breathing better, significant decrease in lower extremity edema.  Objective: Vital signs in last 24 hours: Temp:  [97.3 F (36.3 C)-99.2 F (37.3 C)] 97.3 F (36.3 C) (11/14 1102) Pulse Rate:  [65-73] 72  (11/14 1102) Resp:  [18-20] 18  (11/14 1102) BP: (114-149)/(67-96) 118/76 mmHg (11/14 1102) SpO2:  [95 %-96 %] 95 % (11/14 1102) Weight:  [108.727 kg (239 lb 11.2 oz)] 239 lb 11.2 oz (108.727 kg) (11/14 0500) Weight change: -6.373 kg (-14 lb 0.8 oz) Body mass index is 35.40 kg/(m^2).  Intake/Output from previous day: 11/13 0701 - 11/14 0700 In: 1105 [P.O.:940; I.V.:3; IV Piggyback:162] Out: 6100 [Urine:6100]   PHYSICAL EXAM: Gen Exam: Awake and alert with clear speech.   Neck: Supple, No JVD.   Chest: B/L clear today. CVS: S1 S2 irregular, no murmurs.  Abdomen: soft, BS +, non tender, non distended. Edema resolved. Extremities: 1+ pitting bilateral edema in lower extremity, legs warm to touch.  Neurologic: Non Focal.   Skin: No Rash.   Wounds: N/A.    Lab Results:  Basename 03/25/11 0622 03/25/11 0120  WBC 5.0 5.8  HGB 10.8* 11.2*  HCT 31.9* 32.0*  PLT 171 170   CMET CMP     Component Value Date/Time   NA 135 03/26/2011 0648   K 3.2* 03/26/2011 0648   CL 99 03/26/2011 0648   CO2 28 03/26/2011 0648   GLUCOSE 91 03/26/2011 0648   BUN 15 03/26/2011 0648   CREATININE 0.78 03/26/2011 0648   CALCIUM 9.4 03/26/2011 0648   PROT 6.1 01/28/2010 1257   ALBUMIN 3.9 01/28/2010 1257   AST 24 01/28/2010 1257   ALT 22 01/28/2010 1257   ALKPHOS 62 01/28/2010 1257   BILITOT 0.7 01/28/2010 1257   GFRNONAA 84* 03/26/2011 0648   GFRAA >90 03/26/2011 0648    MICROBIOLOGY: No results found for this or any previous visit (from the past 240 hour(s)).  Studies/Results: Ct Head Wo Contrast  03/24/2011  *RADIOLOGY REPORT*  Clinical Data: aphasia.  Slurred speech for 2 days.  Bilateral lower extremity edema and weakness.  CT HEAD WITHOUT CONTRAST  Technique:   Contiguous axial images were obtained from the base of the skull through the vertex without contrast.  Comparison: None.  Findings: There is diffuse patchy low density throughout the subcortical and periventricular white matter consistent with chronic small vessel ischemic change.  There is prominence of the sulci and ventricles consistent with brain atrophy.  There is no evidence for acute brain infarct, hemorrhage or mass.  The paranasal sinuses and mastoid air cells are clear.  The skull is intact.  IMPRESSION:  1.  Small vessel ischemic change and brain atrophy. 2.  No acute intracranial abnormalities.  Original Report Authenticated By: Rosealee Albee, M.D.   Dg Chest Port 1 View  03/25/2011  *RADIOLOGY REPORT*  Clinical Data: Shortness of breath  PORTABLE CHEST - 1 VIEW  Comparison: 03/24/2011  Findings:  The patient is status post median sternotomy and CABG procedure.  Heart size is moderately enlarged.  There is no pleural effusion or pulmonary edema.  Coarsened interstitial markings are noted bilaterally.  There is plate-like atelectasis noted within the lung bases.  IMPRESSION:  1.  Cardiac enlargement.  No heart failure.  Original Report Authenticated By: Rosealee Albee, M.D.   Dg Chest Portable 1 View  03/24/2011  *RADIOLOGY REPORT*  Clinical Data: Edema.  Shortness of breath.  Hypertension.  PORTABLE CHEST - 1 VIEW  Comparison: 04/16/2010  Findings: Thoracic  aortic aneurysm and cardiomegaly noted.  Prior CABG is also noted.  Pulmonary venous hypertension is present without overt edema.  No pleural effusion is observed.  IMPRESSION:  1.  Cardiomegaly and pulmonary venous hypertension, without overt edema. 2.  Tortuous and aneurysmal thoracic aorta.  Original Report Authenticated By: Dellia Cloud, M.D.    Medications:  Scheduled:    . aspirin  325 mg Oral QHS  . ceFAZolin (ANCEF) IV  1 g Intravenous Q8H  . digoxin  62.5 mcg Oral Daily  . enalapril  20 mg Oral BID  .  enoxaparin  40 mg Subcutaneous Q24H  . furosemide  40 mg Intravenous Q12H  . metoprolol  100 mg Oral BID  . nystatin   Topical BID  . potassium chloride  20 mEq Oral Once  . potassium chloride  40 mEq Oral BID  . simvastatin  20 mg Oral QHS  . sodium chloride  3 mL Intravenous Q12H  . Tamsulosin HCl  0.4 mg Oral Daily  . DISCONTD: furosemide  60 mg Intravenous Q12H    Assessment/Plan: Principal Problem:  *CHF, acute on chronic-likely combined diastolic and systolic dysfunction. -Significant better today. Has lost around 13 pounds so far, diuresing well. Still not back to dry weight. -Decrease Lasix to 40 IV twice a day -Continue with Metoprolol and enalapril at current dosing.  Active Problems:  HYPERLIPIDEMIA -Continue with Zocor   HYPERTENSION -Controlled with Lasix, metoprolol and enalapril.   CORONARY ARTERY DISEASE -Cardiac enzymes negative. -Continue with aspirin, statin and beta blockers.   Atrial fibrillation -Rate controlled with digoxin and metoprolol. -Not on Coumadin chronically, maintained on aspirin.   AORTIC DISSECTION-type B -Blood pressure pretty well controlled, continue with current medications. -Monitor BP    BENIGN PROSTATIC HYPERTROPHY -Continue Flomax.   Cellulitis-lower abdominal wall -Continue with nystatin. -DC Ancef tomorrow   OSTEOARTHRITIS -As needed tramadol.  Obtain PT eval.  CODE STATUS -DO NOT RESUSCITATE.  Disposition -Remain inpatient.   Maretta Bees, MD. 03/26/2011, 1:35 PM

## 2011-03-26 NOTE — ED Provider Notes (Signed)
Medical screening examination/treatment/procedure(s) were conducted as a shared visit with non-physician practitioner(s) and myself.  I personally evaluated the patient during the encounter   Kalynne Womac A. Patrica Duel, MD 03/26/11 1335

## 2011-03-26 NOTE — ED Provider Notes (Signed)
Medical screening examination/treatment/procedure(s) were conducted as a shared visit with non-physician practitioner(s) and myself.  I personally evaluated the patient during the encounter   Riaz Onorato A. Patrica Duel, MD 03/26/11 1334

## 2011-03-26 NOTE — Progress Notes (Signed)
Discussed with patient, he is now not sure about DNR status, had told cardiology earlier that he would want to be placed on a ventilator for a brief period, therefore will change code status to full code. Orders written.No family at bedside.

## 2011-03-26 NOTE — Progress Notes (Addendum)
Subjective:  Less SOB  Objective:  Vital Signs in the last 24 hours: Temp:  [97.4 F (36.3 C)-99.2 F (37.3 C)] 97.7 F (36.5 C) (11/14 0500) Pulse Rate:  [65-73] 68  (11/14 0500) Resp:  [18-20] 20  (11/14 0500) BP: (114-149)/(67-96) 114/67 mmHg (11/14 0500) SpO2:  [95 %-96 %] 95 % (11/14 0500) Weight:  [108.727 kg (239 lb 11.2 oz)] 239 lb 11.2 oz (108.727 kg) (11/14 0500)  Intake/Output from previous day: 11/13 0701 - 11/14 0700 In: 1105 [P.O.:940; I.V.:3; IV Piggyback:162] Out: 6100 [Urine:6100]  Physical Exam: General appearance: alert, cooperative and no distress Lungs: clear to auscultation bilaterally Heart: irregularly irregular rhythm   Rate: 80  Rhythm: atrial fibrillation, PVCs  Lab Results:  Basename 03/25/11 0622 03/25/11 0120  WBC 5.0 5.8  HGB 10.8* 11.2*  PLT 171 170    Basename 03/26/11 0648 03/25/11 0622  NA 135 139  K 3.2* 3.7  CL 99 102  CO2 28 27  GLUCOSE 91 85  BUN 15 17  CREATININE 0.78 0.82    Basename 03/25/11 1558 03/25/11 0900  TROPONINI <0.30 <0.30   Hepatic Function Panel No results found for this basename: PROT,ALBUMIN,AST,ALT,ALKPHOS,BILITOT,BILIDIR,IBILI in the last 72 hours No results found for this basename: CHOL in the last 72 hours No results found for this basename: PROTIME in the last 72 hours  Imaging: Ct Head Wo Contrast  03/24/2011  *RADIOLOGY REPORT*  Clinical Data: aphasia.  Slurred speech for 2 days.  Bilateral lower extremity edema and weakness.  CT HEAD WITHOUT CONTRAST  Technique:  Contiguous axial images were obtained from the base of the skull through the vertex without contrast.  Comparison: None.  Findings: There is diffuse patchy low density throughout the subcortical and periventricular white matter consistent with chronic small vessel ischemic change.  There is prominence of the sulci and ventricles consistent with brain atrophy.  There is no evidence for acute brain infarct, hemorrhage or mass.  The  paranasal sinuses and mastoid air cells are clear.  The skull is intact.  IMPRESSION:  1.  Small vessel ischemic change and brain atrophy. 2.  No acute intracranial abnormalities.  Original Report Authenticated By: Rosealee Albee, M.D.   Dg Chest Port 1 View  03/25/2011  *RADIOLOGY REPORT*  Clinical Data: Shortness of breath  PORTABLE CHEST - 1 VIEW  Comparison: 03/24/2011  Findings:  The patient is status post median sternotomy and CABG procedure.  Heart size is moderately enlarged.  There is no pleural effusion or pulmonary edema.  Coarsened interstitial markings are noted bilaterally.  There is plate-like atelectasis noted within the lung bases.  IMPRESSION:  1.  Cardiac enlargement.  No heart failure.  Original Report Authenticated By: Rosealee Albee, M.D.   Dg Chest Portable 1 View  03/24/2011  *RADIOLOGY REPORT*  Clinical Data: Edema.  Shortness of breath.  Hypertension.  PORTABLE CHEST - 1 VIEW  Comparison: 04/16/2010  Findings: Thoracic aortic aneurysm and cardiomegaly noted.  Prior CABG is also noted.  Pulmonary venous hypertension is present without overt edema.  No pleural effusion is observed.  IMPRESSION:  1.  Cardiomegaly and pulmonary venous hypertension, without overt edema. 2.  Tortuous and aneurysmal thoracic aorta.  Original Report Authenticated By: Dellia Cloud, M.D.    Cardiac Studies:  Assessment/Plan:   Principal Problem:  * acute on chronic diastolic CHF Active Problems:  HYPERLIPIDEMIA  HYPERTENSION  CORONARY ARTERY DISEASE, CABG x3 2/11  Chronic Atrial fibrillation  AORTIC DISSECTION, type B, no coumadin  BENIGN PROSTATIC HYPERTROPHY  OSTEOARTHRITIS  Cellulitis  Hypokaylemia   Continues to diurese, will give extra KCL this am, check BNP in am.   Lee Shelter PA-C 03/26/2011, 8:44 AM  Prodigious diuresis.Examined and care discussed with his daughter. Agree w Lee Gutierrez's note. Change to PO diuretics in AM.    He had noticed edema and weight  gain (10 lb) occurring gradually over a couple of weeks.Became worse and progressed to orthopnea over weekend.  We discussed the importance of recognizing AND addressing signs of fluid retention early on (>2 lbs/24h or 5 lbs/week), by calling MD and increasing furosemide dose until weight decreases again. Long discussion reviewing sodium restriction and signs and symptoms of heart failure.  The patient tells me he is essentially FULL CODE (ok w intubation if it is temporary).  Lee Brosh  MD 4:16 PM

## 2011-03-26 NOTE — Progress Notes (Signed)
Physical Therapy Evaluation Patient Details Name: Lee Gutierrez MRN: [id number] DOB: Oct 17, 1930 Today's Date: 03/26/2011  Problem List:  Patient Active Problem List  Diagnoses  . HYPERLIPIDEMIA  . HYPERTENSION  . CORONARY ARTERY DISEASE  . Atrial fibrillation  . AORTIC DISSECTION  . VENTRAL HERNIA  . BENIGN PROSTATIC HYPERTROPHY  . OSTEOARTHRITIS  . CHF, acute on chronic  . Cellulitis  . Pulmonary hypertension    Past Medical History:  Past Medical History  Diagnosis Date  . Hypertension   . Arthritis   . Coronary artery disease   . 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  Marland Kitchen Shortness of breath   . Aortic dissection   . Dysrhythmia     atrial fib  . Blood transfusion    Past Surgical History:  Past Surgical History  Procedure Date  . Coronary artery bypass graft   . Replacement total knee     bilaterally    PT Assessment/Plan/Recommendation PT Assessment Clinical Impression Statement: Pt is 77 yo male with CHF exacerbation who has experienced a decline in function over last several wks as swelling and dyspnea increased.  This is improving currently and pt ambulated 400 ft with only 2/4 DOE.  Recommend acute PT to progress mobility while hospitalized but do not anticipate pt needing PT f/u after d/c, as he is currently participating in cardiac rehab 2x/ wk and is motivated to return to PLOF. PT Recommendation/Assessment: Patient will need skilled PT in the acute care venue PT Problem List: Decreased strength;Decreased mobility;Decreased activity tolerance;Decreased coordination;Cardiopulmonary status limiting activity;Obesity Barriers to Discharge: None PT Therapy Diagnosis : Generalized weakness;Difficulty walking;Abnormality of gait PT Plan PT Frequency: Min 3X/week PT Treatment/Interventions: DME instruction;Gait training;Functional mobility training;Stair training;Therapeutic exercise;Balance training;Patient/family education PT  Recommendation Recommendations for Other Services: OT consult Follow Up Recommendations: None Equipment Recommended: None recommended by PT PT Goals  Acute Rehab PT Goals PT Goal Formulation: With patient Time For Goal Achievement: 7 days Pt will go Supine/Side to Sit: Independently PT Goal: Supine/Side to Sit - Progress: Progressing toward goal Pt will go Sit to Supine/Side: Independently PT Goal: Sit to Supine/Side - Progress: Progressing toward goal Pt will Transfer Bed to Chair/Chair to Bed: with modified independence PT Transfer Goal: Bed to Chair/Chair to Bed - Progress: Progressing toward goal Pt will Ambulate: with modified independence;with rolling walker;>150 feet;with gait velocity >(comment) ft/second (2.4 ft/sec) PT Goal: Ambulate - Progress: Progressing toward goal Pt will Go Up / Down Stairs: 3-5 stairs;with rail(s);with supervision PT Goal: Up/Down Stairs - Progress: Not met Pt will Perform Home Exercise Program: Independently PT Goal: Perform Home Exercise Program - Progress: Progressing toward goal  PT Evaluation Precautions/Restrictions  Precautions Precautions: Fall Required Braces or Orthoses: No Restrictions Weight Bearing Restrictions: No Prior Functioning  Home Living Lives With: Spouse Receives Help From: Family (spouse able to provide min A) Type of Home: House (condo) Home Layout: One level Home Access: Stairs to enter Entrance Stairs-Rails: Right Entrance Stairs-Number of Steps: 5 Bathroom Shower/Tub: Health visitor: Handicapped height Bathroom Accessibility: Yes How Accessible: Accessible via walker;Accessible via wheelchair Home Adaptive Equipment: Built-in shower seat;Straight cane;Walker - four wheeled;Walker - rolling Prior Function Level of Independence: Independent with basic ADLs;Independent with transfers;Requires assistive device for independence (SPC short distance, RW longer distances) Able to Take Stairs?: Yes  (descends stairs bkwds) Driving: Yes Vocation: Retired Leisure: Hobbies-yes (Comment) Comments: grocery shopping Cognition Cognition Arousal/Alertness: Awake/alert Overall Cognitive Status: Appears within functional limits for tasks assessed Sensation/Coordination Sensation  Light Touch: Impaired by gross assessment (due to swelling bilat LE's) Stereognosis: Not tested Hot/Cold: Not tested Proprioception: Impaired by gross assessment (impaired in LE's by bilat ankle OA) Coordination Gross Motor Movements are Fluid and Coordinated: No Fine Motor Movements are Fluid and Coordinated: Not tested Coordination and Movement Description: coordination limited by swelling (though it is improving) and coordination with ambulation limited by restricted ankle ROM and instability Extremity Assessment RUE Assessment RUE Assessment: Not tested LUE Assessment LUE Assessment: Not tested RLE Assessment RLE Assessment: Exceptions to [facility] RLE AROM (degrees) Overall AROM Right Lower Extremity: Deficits;Due to premorbid status RLE Overall AROM Comments: decreased AROM knee flexion and all ankle motion RLE Strength RLE Overall Strength: Deficits (due to recent decr in mobility b/c of swelling & dyspnea) RLE Overall Strength Comments: overall strength 4/5, except ankle as noted Right Ankle Dorsiflexion: 3/5 LLE Assessment LLE Assessment: Exceptions to WFL LLE AROM (degrees) Overall AROM Left Lower Extremity: Deficits;Due to premorbid status LLE Overall AROM Comments: decreased AROM knee flexion and all ankle motion (left worse than right) LLE Strength LLE Overall Strength: Deficits;Other (Comment) (due to recent decr in activity b/c of swelling & dyspnea) LLE Overall Strength Comments: grossly 4/5 throughout except ankle as noted Left Ankle Dorsiflexion: 3/5 Mobility (including Balance) Bed Mobility Bed Mobility: Yes Supine to Sit: 6: Modified independent (Device/Increase time) Sitting - Scoot to  Edge of Bed: 6: Modified independent (Device/Increase time) (uses momentum) Sit to Supine - Left: 6: Modified independent (Device/Increase time);HOB flat Transfers Transfers: Yes Sit to Stand: 5: Supervision;From bed Sit to Stand Details (indicate cue type and reason): has difficulty with low surfaces but with increased time, pt was able to get up from non-elevated bed.  Often needs to rock to get up at home Stand to Sit: 6: Modified independent (Device/Increase time);To bed Ambulation/Gait Ambulation/Gait: Yes Ambulation/Gait Assistance: 5: Supervision Ambulation/Gait Assistance Details (indicate cue type and reason): began with min A but pt safe for ambulation with only supervision Ambulation Distance (Feet): 400 Feet Assistive device: Rolling walker Gait Pattern: Decreased stride length;Decreased dorsiflexion - right;Decreased dorsiflexion - left (bilate feet turned out with increased ankle inversion) Gait velocity: decreased with distance Stairs: No Wheelchair Mobility Wheelchair Mobility: No  Posture/Postural Control Posture/Postural Control: Postural limitations Postural Limitations: pt limited without use of RW currently.  Educ pt on consistent use of RW, instead of SPC, for now to improve posture and symmetry with gait Balance Balance Assessed: Yes Static Standing Balance Static Standing - Level of Assistance: 5: Stand by assistance (with RW) Exercise  General Exercises - Lower Extremity Ankle Circles/Pumps: AROM;Both;10 reps;Supine Quad Sets: AROM;Strengthening;Both;10 reps;Supine Heel Slides: AROM;Both;10 reps;Supine;Strengthening Straight Leg Raises: AROM;Strengthening;Both;10 reps;Supine End of Session PT - End of Session Equipment Utilized During Treatment: Gait belt Activity Tolerance: Patient tolerated treatment well Patient left: in bed;with call bell in reach;with family/visitor present Nurse Communication: Mobility status for ambulation (encouraged pt to ambulate  in hall with family) General Behavior During Session: [facility] for tasks performed Cognition: [facility] for tasks performed  Khadir Roam, [place]  [phone] 03/26/2011, 3:08 PM

## 2011-03-27 LAB — BASIC METABOLIC PANEL
BUN: 17 mg/dL (ref 6–23)
CO2: 27 mEq/L (ref 19–32)
GFR calc non Af Amer: 86 mL/min — ABNORMAL LOW (ref >90)
Glucose, Bld: 94 mg/dL (ref 70–99)
Potassium: 4 mEq/L (ref 3.5–5.1)

## 2011-03-27 LAB — PRO B NATRIURETIC PEPTIDE: Pro B Natriuretic peptide (BNP): 9418 pg/mL — ABNORMAL HIGH (ref 0–450)

## 2011-03-27 MED ORDER — DOXYCYCLINE HYCLATE 50 MG PO CAPS: 100.0000 mg | ORAL_CAPSULE | Freq: Two times a day (BID) | ORAL | Status: AC

## 2011-03-27 MED ORDER — FUROSEMIDE 40 MG PO TABS: 40.0000 mg | ORAL_TABLET | Freq: Every day | ORAL | Status: DC

## 2011-03-27 MED ORDER — POTASSIUM CHLORIDE CRYS ER 10 MEQ PO TBCR: 20.0000 meq | EXTENDED_RELEASE_TABLET | Freq: Every day | ORAL | Status: DC

## 2011-03-27 NOTE — Discharge Summary (Signed)
PATIENT DETAILS Name: Lee Gutierrez Age: 77 y.o. Sex: male Date of Birth: 01/05/1931 MRN: [id number]. Admit Date: 03/24/2011 Admitting Physician: Jeoffrey Massed ION:[id number] Alphonsus Sias, MD, MD Primary cardiologist: Dr. Nicki Guadalajara of [facility] and heart and vascular cardiology   PRIMARY DISCHARGE DIAGNOSIS:  Principal Problem:  *CHF, acute on chronic-combined systolic and diastolic heart failure-combined systolic and diastolic dysfunction  Active Problems:  HYPERLIPIDEMIA  HYPERTENSION  CORONARY ARTERY DISEASE  Atrial fibrillation  AORTIC DISSECTION-Type B  BENIGN PROSTATIC HYPERTROPHY  Cellulitis-abdominal wall -resolved  OSTEOARTHRITIS      PAST MEDICAL HISTORY: Past Medical History  Diagnosis Date  . Hypertension   . Arthritis   . Coronary artery disease   . 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  Marland Kitchen Shortness of breath   . Aortic dissection   . Dysrhythmia     atrial fib  . Blood transfusion     DISCHARGE MEDICATIONS: Current Discharge Medication List    START taking these medications   Details  doxycycline (VIBRAMYCIN) 50 MG capsule Take 2 capsules (100 mg total) by mouth 2 (two) times daily. Qty: 4 capsule, Refills: 0    furosemide (LASIX) 40 MG tablet Take 1 tablet (40 mg total) by mouth daily. Qty: 30 tablet, Refills: 0      CONTINUE these medications which have CHANGED   Details  potassium chloride (K-DUR,KLOR-CON) 10 MEQ tablet Take 2 tablets (20 mEq total) by mouth daily. Qty: 30 tablet, Refills: 0      CONTINUE these medications which have NOT CHANGED   Details  Acetaminophen (ARTHRITIS PAIN RELIEF PO) Take 1 tablet by mouth daily as needed. For pain     amLODipine (NORVASC) 2.5 MG tablet Take 7.5 mg by mouth daily.      aspirin 325 MG tablet Take 325 mg by mouth at bedtime.      digoxin (LANOXIN) 0.125 MG tablet Take 62.5 mcg by mouth daily.      Diphenhydramine-APAP, sleep, (TYLENOL PM EXTRA STRENGTH PO)  Take 2 tablets by mouth daily as needed. For pain     enalapril (VASOTEC) 20 MG tablet Take 20 mg by mouth 2 (two) times daily.      metoprolol (LOPRESSOR) 100 MG tablet Take 100 mg by mouth 2 (two) times daily.      Multiple Vitamins-Minerals (CENTRUM SILVER PO) Take 1 tablet by mouth daily.      simvastatin (ZOCOR) 20 MG tablet Take 20 mg by mouth at bedtime.      Tamsulosin HCl (FLOMAX) 0.4 MG CAPS TAKE ONE CAPSULE BY MOUTH DAILY AFTER MEAL Qty: 90 capsule, Refills: 3    traMADol (ULTRAM) 50 MG tablet Take 25-50 mg by mouth at bedtime as needed. Maximum dose= 8 tablets per day.  For pain.          BRIEF HPI:  See H&P, Labs, Consult and Test reports for all details in brief, patient was admitted for significant edema and worsening shortness of breath particularly on exertion. He was found to have exacerbation of combined systolic and diastolic heart failure and admitted to the hospitalist service. For further details please see the history and physical that was dictated on admission.  CONSULTATIONS:   cardiology-[facility]  PERTINENT RADIOLOGIC STUDIES: Ct Head Wo Contrast  03/24/2011  *RADIOLOGY REPORT*  Clinical Data: aphasia.  Slurred speech for 2 days.  Bilateral lower extremity edema and weakness.  CT HEAD WITHOUT CONTRAST  Technique:  Contiguous axial images were obtained from the base  of the skull through the vertex without contrast.  Comparison: None.  Findings: There is diffuse patchy low density throughout the subcortical and periventricular white matter consistent with chronic small vessel ischemic change.  There is prominence of the sulci and ventricles consistent with brain atrophy.  There is no evidence for acute brain infarct, hemorrhage or mass.  The paranasal sinuses and mastoid air cells are clear.  The skull is intact.  IMPRESSION:  1.  Small vessel ischemic change and brain atrophy. 2.  No acute intracranial abnormalities.  Original Report  Authenticated By: Rosealee Albee, M.D.   Dg Chest Port 1 View  03/25/2011  *RADIOLOGY REPORT*  Clinical Data: Shortness of breath  PORTABLE CHEST - 1 VIEW  Comparison: 03/24/2011  Findings:  The patient is status post median sternotomy and CABG procedure.  Heart size is moderately enlarged.  There is no pleural effusion or pulmonary edema.  Coarsened interstitial markings are noted bilaterally.  There is plate-like atelectasis noted within the lung bases.  IMPRESSION:  1.  Cardiac enlargement.  No heart failure.  Original Report Authenticated By: Rosealee Albee, M.D.   Dg Chest Portable 1 View  03/24/2011  *RADIOLOGY REPORT*  Clinical Data: Edema.  Shortness of breath.  Hypertension.  PORTABLE CHEST - 1 VIEW  Comparison: 04/16/2010  Findings: Thoracic aortic aneurysm and cardiomegaly noted.  Prior CABG is also noted.  Pulmonary venous hypertension is present without overt edema.  No pleural effusion is observed.  IMPRESSION:  1.  Cardiomegaly and pulmonary venous hypertension, without overt edema. 2.  Tortuous and aneurysmal thoracic aorta.  Original Report Authenticated By: Dellia Cloud, M.D.     PERTINENT LAB RESULTS: CBC:  Basename 03/25/11 0622 03/25/11 0120  WBC 5.0 5.8  HGB 10.8* 11.2*  HCT 31.9* 32.0*  PLT 171 170   CMET CMP     Component Value Date/Time   NA 138 03/27/2011 0559   K 4.0 03/27/2011 0559   CL 100 03/27/2011 0559   CO2 27 03/27/2011 0559   GLUCOSE 94 03/27/2011 0559   BUN 17 03/27/2011 0559   CREATININE 0.74 03/27/2011 0559   CALCIUM 9.8 03/27/2011 0559   PROT 6.1 01/28/2010 1257   ALBUMIN 3.9 01/28/2010 1257   AST 24 01/28/2010 1257   ALT 22 01/28/2010 1257   ALKPHOS 62 01/28/2010 1257   BILITOT 0.7 01/28/2010 1257   GFRNONAA 86* 03/27/2011 0559   GFRAA >90 03/27/2011 0559    GFR Estimated Creatinine Clearance: 91.6 ml/min (by C-G formula based on Cr of 0.74). No results found for this basename: LIPASE:2,AMYLASE:2 in the last 72  hours  Basename 03/25/11 1558 03/25/11 0900 03/25/11 0121  CKTOTAL 59 64 75  CKMB 2.7 2.8 3.3  CKMBINDEX -- -- --  TROPONINI <0.30 <0.30 <0.30    Basename 03/27/11 0559 03/26/11 0645 03/25/11 0627  POCBNP 9418.0* 9675.0* 7632.0*  Coags: No results found for this basename: PT:2,INR:2 in the last 72 hours Microbiology: No results found for this or any previous visit (from the past 240 hour(s)).  BRIEF HOSPITAL COURSE:   Principal Problem:  *CHF, acute on chronic-combined systolic and diastolic dysfunction -On admission, patient had evidence of significant edema particularly on his lower extremities extending to the upper thighs. He had worsening shortness of breath particularly with exertion than his usual baseline. He had gained around 15 pounds over the past month or so on. -Patient is admitted to the telemetry unit, was given intravenous Lasix. He was continued on his beta blockers  and ACE inhibitors. Over the hospital course he had significant diuresis. His weight on admission was 253 pounds, and on day of discharge his weight has come down to 235 pounds. Patient claims that this is his usual dry weight. -His furosemide has now been changed to 40 mg by mouth daily. -I've spoken with [facility] heart and vascular, they're fine with the patient being discharged, they will call the patient and have him followup in the office within one week time.  Active Problems:  HYPERLIPIDEMIA -Patient will continue with statin on discharge.   HYPERTENSION -This is stable, patient will continue with Lopressor, enalapril, amlodipine on discharge. -Patient has a history of type B. aortic dissection, his blood pressure with strict control.   CORONARY ARTERY DISEASE -Cardiac enzymes were cycled and these were negative.   Atrial fibrillation -This is rate controlled with digoxin and metoprolol. He is on aspirin, he is not on Coumadin.   AORTIC DISSECTION-type B. -Continue with strict BP  control.   BENIGN PROSTATIC HYPERTROPHY -Continue with Flomax   Cellulitis of the abdominal wall area -And was started on Ancef, was also given nystatin powder. This is almost resolved. Patient will be transitioned to doxycycline for the next few days.   OSTEOARTHRITIS -This is stable     TODAY-DAY OF DISCHARGE:  Subjective:   Lee Gutierrez today has no headache,no chest abdominal pain,no new weakness tingling or numbness, feels much better wants to go home today. He is almost close to his dry weight. His leg edema and shortness of breath has significantly improved.  Objective:   Blood pressure 155/72, pulse 85, temperature 97.8 F (36.6 C), temperature source Oral, resp. rate 18, height 5\' 9"  (1.753 m), weight 106.6 kg (235 lb 0.2 oz), SpO2 93.00%.  Intake/Output Summary (Last 24 hours) at 03/27/11 1020 Last data filed at 03/27/11 0852  Gross per 24 hour  Intake   1121 ml  Output   4126 ml  Net  -3005 ml    Exam Awake Alert, Oriented *3, No new F.N deficits, Normal affect [place].AT,PERRAL Supple Neck,No JVD, No cervical lymphadenopathy appriciated.  Symmetrical Chest wall movement, Good air movement bilaterally, CTAB RRR,No Gallops,Rubs or new Murmurs, No Parasternal Heave +ve B.Sounds, Abd Soft, Non tender, No organomegaly appriciated, No rebound -guarding or rigidity. No Cyanosis, Clubbing or edema, No new Rash or bruise  DISPOSITION: Discharge to home.    DISCHARGE INSTRUCTIONS:    Follow-up Information    Follow up with Tillman Abide, MD. Make an appointment in 1 week.   Contact information:   [address] [place] [address] [phone]       Follow up with Lennette Bihari, MD. (office will call with appt)    Contact information:   [address]  [place] [address] [phone]          Total Time spent on discharge equals 45 minutes.  SignedJeoffrey Massed 03/27/2011 10:20 AM

## 2011-03-27 NOTE — Progress Notes (Signed)
  Subjective:  Up in room, feeling back to baseline.  Objective:  Vital Signs in the last 24 hours: Temp:  [97.3 F (36.3 C)-98.7 F (37.1 C)] 97.8 F (36.6 C) (11/15 0403) Pulse Rate:  [72-86] 85  (11/15 0403) Resp:  [18-20] 18  (11/15 0403) BP: (106-155)/(70-76) 155/72 mmHg (11/15 0403) SpO2:  [91 %-96 %] 93 % (11/15 0403) Weight:  [106.6 kg (235 lb 0.2 oz)] 235 lb 0.2 oz (106.6 kg) (11/15 0403)  Intake/Output from previous day: 11/14 0701 - 11/15 0700 In: 1121 [P.O.:960; I.V.:3; IV Piggyback:158] Out: 3675 [Urine:3675]  Physical Exam: General appearance: alert, cooperative and no distress Lungs: clear to auscultation bilaterally Heart: irregularly irregular rhythm   Rate: 80  Rhythm: atrial fibrillation  Lab Results:  Basename 03/25/11 0622 03/25/11 0120  WBC 5.0 5.8  HGB 10.8* 11.2*  PLT 171 170    Basename 03/27/11 0559 03/26/11 0648  NA 138 135  K 4.0 3.2*  CL 100 99  CO2 27 28  GLUCOSE 94 91  BUN 17 15  CREATININE 0.74 0.78    Basename 03/25/11 1558 03/25/11 0900  TROPONINI <0.30 <0.30   Hepatic Function Panel No results found for this basename: PROT,ALBUMIN,AST,ALT,ALKPHOS,BILITOT,BILIDIR,IBILI in the last 72 hours No results found for this basename: CHOL in the last 72 hours No results found for this basename: PROTIME in the last 72 hours  Imaging: No results found.  Cardiac Studies:  Assessment/Plan:   Principal Problem:  *CHF, acute on chronic, improved Active Problems:  HYPERLIPIDEMIA  HYPERTENSION  CORONARY ARTERY DISEASE  Atrial fibrillation  AORTIC DISSECTION  BENIGN PROSTATIC HYPERTROPHY  OSTEOARTHRITIS  Cellulitis  Plan- despite BNP of 9000 he seems to be at baseline. Adm Wgt 115-Dc Wgt 106.6. Would send home on Lasix 40mg  daily (on 20mg  daily pta). Follow up with Baxter Hire in one week in our office.    Corine Shelter PA-C 03/27/2011, 8:12 AM

## 2011-03-27 NOTE — Progress Notes (Signed)
Pt's tele d/c, pt's IV d/c. Patient and wife verbalizes understanding of discharge instructions and medications.________________________________________________________________________________________________D. Manson Passey RN

## 2011-03-27 NOTE — Plan of Care (Signed)
Problem: Phase I Progression Outcomes Goal: EF % per last Echo/documented,Core Reminder form on chart Outcome: Completed/Met Date Met:  03/27/11 Ef 40 to 45%

## 2011-03-31 ENCOUNTER — Encounter: Payer: Self-pay | Admitting: Internal Medicine

## 2011-04-01 ENCOUNTER — Ambulatory Visit (INDEPENDENT_AMBULATORY_CARE_PROVIDER_SITE_OTHER): Payer: Medicare Other | Admitting: Internal Medicine

## 2011-04-01 ENCOUNTER — Encounter: Payer: Self-pay | Admitting: Internal Medicine

## 2011-04-01 ENCOUNTER — Other Ambulatory Visit (HOSPITAL_COMMUNITY): Payer: Self-pay | Admitting: Cardiology

## 2011-04-01 ENCOUNTER — Telehealth: Payer: Self-pay | Admitting: Radiology

## 2011-04-01 DIAGNOSIS — I509 Heart failure, unspecified: Secondary | ICD-10-CM

## 2011-04-01 DIAGNOSIS — R0989 Other specified symptoms and signs involving the circulatory and respiratory systems: Secondary | ICD-10-CM

## 2011-04-01 DIAGNOSIS — R0689 Other abnormalities of breathing: Secondary | ICD-10-CM

## 2011-04-01 DIAGNOSIS — M1990 Unspecified osteoarthritis, unspecified site: Secondary | ICD-10-CM

## 2011-04-01 DIAGNOSIS — I4891 Unspecified atrial fibrillation: Secondary | ICD-10-CM

## 2011-04-01 DIAGNOSIS — I5032 Chronic diastolic (congestive) heart failure: Secondary | ICD-10-CM

## 2011-04-01 DIAGNOSIS — R0609 Other forms of dyspnea: Secondary | ICD-10-CM

## 2011-04-01 DIAGNOSIS — I10 Essential (primary) hypertension: Secondary | ICD-10-CM

## 2011-04-01 LAB — BASIC METABOLIC PANEL
BUN: 26 mg/dL — ABNORMAL HIGH (ref 6–23)
Calcium: 9.2 mg/dL (ref 8.4–10.5)
Creatinine, Ser: 1 mg/dL (ref 0.4–1.5)
GFR: 78.53 mL/min (ref >60.00)

## 2011-04-01 LAB — BRAIN NATRIURETIC PEPTIDE: Pro B Natriuretic peptide (BNP): 1273 pg/mL — ABNORMAL HIGH (ref 0.0–100.0)

## 2011-04-01 MED ORDER — TRAMADOL HCL 50 MG PO TABS: 25.0000 mg | ORAL_TABLET | Freq: Every evening | ORAL | Status: DC | PRN

## 2011-04-01 NOTE — Assessment & Plan Note (Signed)
Rate is okay No changes

## 2011-04-01 NOTE — Progress Notes (Signed)
Subjective:    Patient ID: Lee Gutierrez, male    DOB: 11-17-1930, 77 y.o.   MRN: [id number]  HPI Feels better now Had increased swelling in legs---very bad Even got fluid in lower abdomen Some DOE as well---even transferring in and out of car caused trouble Hospitalized for CHF EF 40-45% Improved with aggressive diuresis--lost 17#  Has always weighed himself Didn't realize his weight gain was fluid Now 228# at home  Still sees Dr Tresa Endo Has instructions to call if weight is up Discussed his dry weight  Ongoing dry cough--seems better now though Discussed that we need to consider ACEI cough if worsens  Has pain in ankle and elsewhere Tramadol does help this  Current Outpatient Prescriptions on File Prior to Visit  Medication Sig Dispense Refill  . Acetaminophen (ARTHRITIS PAIN RELIEF PO) Take 1 tablet by mouth daily as needed. For pain       . amLODipine (NORVASC) 2.5 MG tablet Take 7.5 mg by mouth daily.        Marland Kitchen aspirin 325 MG tablet Take 325 mg by mouth at bedtime.        . digoxin (LANOXIN) 0.125 MG tablet Take 62.5 mcg by mouth daily.        . Diphenhydramine-APAP, sleep, (TYLENOL PM EXTRA STRENGTH PO) Take 2 tablets by mouth daily as needed. For pain       . doxycycline (VIBRAMYCIN) 50 MG capsule Take 2 capsules (100 mg total) by mouth 2 (two) times daily.  4 capsule  0  . enalapril (VASOTEC) 20 MG tablet Take 20 mg by mouth 2 (two) times daily.        . furosemide (LASIX) 40 MG tablet Take 1 tablet (40 mg total) by mouth daily.  30 tablet  0  . metoprolol (LOPRESSOR) 100 MG tablet Take 100 mg by mouth 2 (two) times daily.        . Multiple Vitamins-Minerals (CENTRUM SILVER PO) Take 1 tablet by mouth daily.        . potassium chloride (K-DUR,KLOR-CON) 10 MEQ tablet Take 2 tablets (20 mEq total) by mouth daily.  30 tablet  0  . simvastatin (ZOCOR) 20 MG tablet Take 20 mg by mouth at bedtime.        . Tamsulosin HCl (FLOMAX) 0.4 MG CAPS TAKE ONE CAPSULE BY MOUTH DAILY  AFTER MEAL  90 capsule  3  . traMADol (ULTRAM) 50 MG tablet Take 25-50 mg by mouth at bedtime as needed. Maximum dose= 8 tablets per day.  For pain.         No Known Allergies  Past Medical History  Diagnosis Date  . Hypertension   . Arthritis   . Coronary artery disease   . 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  Marland Kitchen Shortness of breath   . Aortic dissection   . Dysrhythmia     atrial fib  . Blood transfusion     Past Surgical History  Procedure Date  . Coronary artery bypass graft   . Replacement total knee     bilaterally    Family History  Problem Relation Age of Onset  . Hypertension Father   . COPD Brother   . Diabetes Neg Hx   . Cancer Neg Hx     History   Social History  . Marital Status: Married    Spouse Name: N/A    Number of Children: 3  . Years of Education: N/A   Occupational History  .  retired Holiday representative    Social History Main Topics  . Smoking status: Former Games developer  . Smokeless tobacco: Never Used  . Alcohol Use: No  . Drug Use: No  . Sexually Active: Not Currently   Other Topics Concern  . Not on file   Social History Narrative   No living will. Wife to make decisions, then [name], daughter.  Would want resuscitation attempts but no prolonged artificial ventilation. Not sure about tube feedings   Review of Systems Appetite is okay Sleep is still not great---up for nocturia. Naps in day as needed    Objective:   Physical Exam  Constitutional: He appears well-developed and well-nourished. No distress.  Neck: Normal range of motion. Neck supple.  Cardiovascular: Normal rate and normal heart sounds.  Exam reveals no gallop.   No murmur heard.      irregular  Pulmonary/Chest: Effort normal and breath sounds normal. No respiratory distress. He has no wheezes. He has no rales.       No dullness to percussion  Musculoskeletal: He exhibits no edema.  Lymphadenopathy:    He has no cervical adenopathy.    Psychiatric: He has a normal mood and affect. His behavior is normal. Judgment and thought content normal.          Assessment & Plan:

## 2011-04-01 NOTE — Telephone Encounter (Signed)
Reviewed Dr. Cain Saupe note and recent BNPs - significant improvement, prior 9k's, and pt seemed to be doing better in office today. May await Dr. Cain Saupe return, will route to him.

## 2011-04-01 NOTE — Telephone Encounter (Signed)
Received a fax from [facility] with critical BNP - 1273.0

## 2011-04-01 NOTE — Assessment & Plan Note (Signed)
BP Readings from Last 3 Encounters:  04/01/11 104/49  03/27/11 134/62  01/28/10 120/80   BP low but no orthostatic symptoms Will consider stopping the amlodipine if is symptomatic

## 2011-04-01 NOTE — Assessment & Plan Note (Signed)
Worst in ankle Satisfied with tramadol

## 2011-04-01 NOTE — Assessment & Plan Note (Signed)
Doing better now Is weighing daily Discussed increasing furosemide if above 230#

## 2011-04-01 NOTE — Patient Instructions (Addendum)
Please take a second furosemide if your weight is over 230# at home Please try to get a regimen of chair exercises

## 2011-04-03 NOTE — Telephone Encounter (Signed)
Will review labs Agree that BNP is better

## 2011-04-09 ENCOUNTER — Telehealth: Payer: Self-pay | Admitting: *Deleted

## 2011-04-09 NOTE — Telephone Encounter (Signed)
.  left message to have patient return my call.  

## 2011-04-09 NOTE — Telephone Encounter (Signed)
Message copied by Sueanne Margarita on Wed Apr 09, 2011 11:19 AM ------      Message from: Tillman Abide I      Created: Thu Apr 03, 2011 12:30 PM       Please call him      Blood work is fine      CHF test has improved considerably      Continue the same meds as discussed at OV

## 2011-04-10 NOTE — Telephone Encounter (Signed)
Pt called back and left message on machine to call him back at [phone]  I called him, got his machine and left a message for him to return the call.

## 2011-04-11 NOTE — Telephone Encounter (Signed)
Advised patients spouse as instructed via telephone.

## 2011-05-09 ENCOUNTER — Ambulatory Visit: Payer: Medicare Other | Admitting: Internal Medicine

## 2011-05-20 ENCOUNTER — Encounter: Payer: Self-pay | Admitting: Internal Medicine

## 2011-05-20 ENCOUNTER — Ambulatory Visit (INDEPENDENT_AMBULATORY_CARE_PROVIDER_SITE_OTHER): Payer: Medicare Other | Admitting: Internal Medicine

## 2011-05-20 DIAGNOSIS — I4891 Unspecified atrial fibrillation: Secondary | ICD-10-CM

## 2011-05-20 DIAGNOSIS — I5032 Chronic diastolic (congestive) heart failure: Secondary | ICD-10-CM

## 2011-05-20 DIAGNOSIS — I509 Heart failure, unspecified: Secondary | ICD-10-CM

## 2011-05-20 DIAGNOSIS — M1990 Unspecified osteoarthritis, unspecified site: Secondary | ICD-10-CM

## 2011-05-20 DIAGNOSIS — N400 Enlarged prostate without lower urinary tract symptoms: Secondary | ICD-10-CM

## 2011-05-20 MED ORDER — TRAMADOL HCL 50 MG PO TABS: 50.0000 mg | ORAL_TABLET | Freq: Two times a day (BID) | ORAL | Status: DC | PRN

## 2011-05-20 NOTE — Assessment & Plan Note (Signed)
Satisfied with tramadol This is now most limiting factor

## 2011-05-20 NOTE — Assessment & Plan Note (Signed)
Voids okay 

## 2011-05-20 NOTE — Progress Notes (Signed)
Addended by: Tillman Abide I on: 05/20/2011 08:44 AM   Modules accepted: Orders

## 2011-05-20 NOTE — Patient Instructions (Signed)
Please hold the metolazone if you begin to feel lightheaded. You should ask Dr Tresa Endo for a low weight goal --under which you should hold the metolazone

## 2011-05-20 NOTE — Progress Notes (Signed)
Subjective:    Patient ID: Lee Gutierrez, male    DOB: January 05, 1931, 78 y.o.   MRN: [id number]  HPI Has had diuretics increased --lasix up to 80mg  Metolazone added--first daily, now down to every 3 days  Weight is down quite a bit Only slight edema now--fluid seems to be out of abdomen Weighs daily at home---now 215#  No orthostatic dizziness Gets tired --relates more to leg and hand pain from arthritis Limited by night in walking but feels this is pain related Uses the tramadol still---mostly at bedtime Uses tylenol arthritis also---not very effective Still has DOE but it is improved  No chest pain No palpitations  Checks BP twice a day Usually 110-120/60-70  Current Outpatient Prescriptions on File Prior to Visit  Medication Sig Dispense Refill  . Acetaminophen (ARTHRITIS PAIN RELIEF PO) Take 1 tablet by mouth daily as needed. For pain       . aspirin 325 MG tablet Take 325 mg by mouth at bedtime.        . digoxin (LANOXIN) 0.125 MG tablet Take 62.5 mcg by mouth daily.        . Diphenhydramine-APAP, sleep, (TYLENOL PM EXTRA STRENGTH PO) Take 2 tablets by mouth daily as needed. For pain       . enalapril (VASOTEC) 20 MG tablet Take 20 mg by mouth 2 (two) times daily.        . metoprolol (LOPRESSOR) 100 MG tablet Take 100 in the morning and 50mg  in the evening.      . Multiple Vitamins-Minerals (CENTRUM SILVER PO) Take 1 tablet by mouth daily.        . potassium chloride (K-DUR,KLOR-CON) 10 MEQ tablet Take 2 tablets (20 mEq total) by mouth daily.  30 tablet  0  . simvastatin (ZOCOR) 20 MG tablet Take 20 mg by mouth at bedtime.        . Tamsulosin HCl (FLOMAX) 0.4 MG CAPS TAKE ONE CAPSULE BY MOUTH DAILY AFTER MEAL  90 capsule  3  . traMADol (ULTRAM) 50 MG tablet Take 0.5-1 tablets (25-50 mg total) by mouth at bedtime as needed. Maximum dose= 8 tablets per day.  For pain.  30 tablet  2    No Known Allergies  Past Medical History  Diagnosis Date  . Hypertension   . Arthritis    . Coronary artery disease   . 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  Marland Kitchen Shortness of breath   . Aortic dissection   . Dysrhythmia     atrial fib  . Blood transfusion     Past Surgical History  Procedure Date  . Coronary artery bypass graft   . Replacement total knee     bilaterally    Family History  Problem Relation Age of Onset  . Hypertension Father   . COPD Brother   . Diabetes Neg Hx   . Cancer Neg Hx     History   Social History  . Marital Status: Married    Spouse Name: N/A    Number of Children: 3  . Years of Education: N/A   Occupational History  . retired Holiday representative    Social History Main Topics  . Smoking status: Former Games developer  . Smokeless tobacco: Never Used  . Alcohol Use: No  . Drug Use: No  . Sexually Active: Not Currently   Other Topics Concern  . Not on file   Social History Narrative   No living will. Wife to make decisions,  then [place], daughter.  Would want resuscitation attempts but no prolonged artificial ventilation. Not sure about tube feedings   Review of Systems Weight down 15# since last month Still going to rehab Appetite is good Sleep is still not great--disturbed by nocturia. Sleeps on 2 pillows--not really propped up    Objective:   Physical Exam  Constitutional: He appears well-developed and well-nourished. No distress.  Neck: Normal range of motion. Neck supple.  Cardiovascular: Normal rate and normal heart sounds.  Exam reveals no gallop.   No murmur heard.      irregular  Pulmonary/Chest: Effort normal and breath sounds normal. No respiratory distress. He has no wheezes. He has no rales.  Musculoskeletal: He exhibits no edema.  Lymphadenopathy:    He has no cervical adenopathy.  Psychiatric: He has a normal mood and affect. His behavior is normal. Judgment and thought content normal.          Assessment & Plan:

## 2011-05-20 NOTE — Assessment & Plan Note (Signed)
Much better Weight down considerably Discussed holding metolazone if weight down more or if orthostatic symptoms Follows with Dr Tresa Endo

## 2011-05-20 NOTE — Assessment & Plan Note (Signed)
Good rate control No coumadin due to aortic dissection 

## 2011-06-16 ENCOUNTER — Telehealth: Payer: Self-pay | Admitting: Internal Medicine

## 2011-06-16 ENCOUNTER — Encounter: Payer: Self-pay | Admitting: Internal Medicine

## 2011-06-16 NOTE — Telephone Encounter (Signed)
Patient needs a letter stating that he is able to start rehab tomorrow. If possible, his spouse can pick up the letter today.

## 2011-06-16 NOTE — Telephone Encounter (Signed)
Patient's wife notified letter is ready and they'll pick up letter.

## 2011-06-16 NOTE — Telephone Encounter (Signed)
Letter done NO charge Let them know it is ready for pick up

## 2011-06-23 ENCOUNTER — Other Ambulatory Visit: Payer: Self-pay | Admitting: Surgery

## 2011-06-23 DIAGNOSIS — I712 Thoracic aortic aneurysm, without rupture: Secondary | ICD-10-CM

## 2011-08-18 ENCOUNTER — Other Ambulatory Visit: Payer: Self-pay | Admitting: Internal Medicine

## 2011-08-19 ENCOUNTER — Other Ambulatory Visit: Payer: Medicare Other

## 2011-08-19 ENCOUNTER — Ambulatory Visit: Payer: Medicare Other | Admitting: Surgery

## 2011-09-02 ENCOUNTER — Ambulatory Visit
Admission: RE | Admit: 2011-09-02 | Discharge: 2011-09-02 | Disposition: A | Discharge status: Home / Self Care | Payer: Medicare Other | Source: Ambulatory Visit | Attending: Surgery | Admitting: Surgery

## 2011-09-02 ENCOUNTER — Ambulatory Visit (INDEPENDENT_AMBULATORY_CARE_PROVIDER_SITE_OTHER): Payer: Medicare Other | Admitting: Surgery

## 2011-09-02 ENCOUNTER — Encounter: Payer: Self-pay | Admitting: Surgery

## 2011-09-02 ENCOUNTER — Ambulatory Visit: Payer: Medicare Other | Admitting: Surgery

## 2011-09-02 VITALS — BP 132/80 | HR 53 | Resp 20 | Ht 68.0 in | Wt 217.0 lb

## 2011-09-02 DIAGNOSIS — I712 Thoracic aortic aneurysm, without rupture: Secondary | ICD-10-CM

## 2011-09-02 DIAGNOSIS — I7100 Dissection of unspecified site of aorta: Secondary | ICD-10-CM

## 2011-09-02 MED ORDER — IOHEXOL 350 MG/ML SOLN
125.0000 mL | Freq: Once | INTRAVENOUS | Status: AC | PRN
  Administered 2011-09-02: 125 mL via INTRAVENOUS

## 2011-09-11 ENCOUNTER — Encounter: Payer: Self-pay | Admitting: Surgery

## 2011-09-11 DIAGNOSIS — I712 Thoracic aortic aneurysm, without rupture: Secondary | ICD-10-CM | POA: Insufficient documentation

## 2011-09-11 NOTE — Progress Notes (Signed)
[address]            [place] [address]          [phone]      HPI:  The patient returns to my office today for followup of an ascending, arch, and descending thoracoabdominal aneurysm with a limited, chronic,type B aortic dissection. I last saw him on 08/06/2010 at which time a CT angiogram of the chest and abdomen dated 04/16/2010 showed stable aneurysmal dilatation of the aorta. Over the past year he denies any chest or back pain. 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 He has been told that he needs ankle replacements.  Current Outpatient Prescriptions  Medication Sig Dispense Refill  . Acetaminophen (ARTHRITIS PAIN RELIEF PO) Take 1 tablet by mouth daily as needed. For pain       . amLODipine (NORVASC) 5 MG tablet Take 5 mg by mouth daily.        Marland Kitchen aspirin 325 MG tablet Take 325 mg by mouth at bedtime.        . digoxin (LANOXIN) 0.125 MG tablet Take 62.5 mcg by mouth daily.        . enalapril (VASOTEC) 20 MG tablet Take 20 mg by mouth 2 (two) times daily.        . furosemide (LASIX) 40 MG tablet Take 80 mg by mouth daily.        . metolazone (ZAROXOLYN) 2.5 MG tablet Take 2.5 mg by mouth every 3 (three) days.        . metoprolol (LOPRESSOR) 100 MG tablet 50 mg 2 (two) times daily. Take 100 in the morning and 50mg  in the evening.      . Multiple Vitamins-Minerals (CENTRUM SILVER PO) Take 1 tablet by mouth daily.        . potassium chloride (K-DUR,KLOR-CON) 10 MEQ tablet Take 2 tablets (20 mEq total) by mouth daily.  30 tablet  0  . simvastatin (ZOCOR) 20 MG tablet Take 20 mg by mouth at bedtime.        . Tamsulosin HCl (FLOMAX) 0.4 MG CAPS TAKE ONE CAPSULE BY MOUTH DAILY AFTER MEAL  90 capsule  3  . traMADol (ULTRAM) 50 MG tablet Take 1 tablet (50 mg total) by mouth 2 (two) times daily as needed. Maximum dose= 8 tablets per day.  For pain.  60 tablet  1  . DISCONTD: furosemide  (LASIX) 40 MG tablet Take 1 tablet (40 mg total) by mouth daily.  30 tablet  0     Physical Exam:  BP 132/80  Pulse 53  Resp 20  Ht 5\' 8"  (1.727 m)  Wt 217 lb (98.431 kg)  BMI 32.99 kg/m2  SpO2 98% He looks well. Cardiac exam shows an irregular rate and rhythm. There is a 1/6 systolic murmur. There is no diastolic murmur. Lungs are clear. There is mild bilateral lower extremity edema to the knee.  Diagnostic Tests:  Clinical Data:  Evaluate known thoracic aortic aneurysm   CT ANGIOGRAPHY CHEST AND ABDOMEN   Technique:  Multidetector CT imaging of the chest and abdomen was performed using the standard protocol during bolus administration of intravenous contrast.  Multiplanar CT image reconstructions including MIPs were obtained to evaluate the vascular anatomy.   Contrast: , OMNIPAQUE IOHEXOL 350 MG/ML SOLN   Comparison:  CTA of the chest, abdomen  pelvis - 04/16/2010   CTA CHEST   Vascular Findings:   Grossly unchanged appearance of a known thoracic aortic aneurysm. The ascending thoracic aorta is unchanged in size measuring approximately 5.0 x 4.9 cm while the proximal descending thoracic aorta is unchanged in size measuring approximately 5.8 x 5.1 cm (as both measured at the level of the main pulmonary artery, image 61, series five).  Unchanged aneurysmal dilatation of the aortic arch measuring approximately 5.6 x 5.5 cm in greatest transverse coronal dimension (coronal image 65, series 601) as measured just distal to the takeoff of the left subclavian artery.   Grossly unchanged appearance of focal outpouching within the proximal descending thoracic aorta with visible intimal flap which again may represent either a focal dissection versus ulcerated plaque formation.  No.  Aortic stranding.   Unchanged dilatation of the distal thoracic aorta measuring approximately 3.9 x 4.1 cm just cranial to the takeoff of the celiac artery (image 118, series five).     Cardiomegaly.  Stable sequela of CABG. Extensive calcifications within the native coronary arteries.  No pericardial effusion. There is unchanged to dilatation of the main pulmonary artery measuring 3.6 cm in diameter.  Although this examination.  Tailored for evaluation of pulmonary artery, there is no discrete central filling defect within the pulmonary arteries to suggest acute pulmonary embolism.   Review of the MIP images confirms the above findings.   Nonvascular findings:   There is stable emphysematous change infecting the bilateral lungs with grossly unchanged reticular opacities involving the peripheral aspect of the left upper lobe.  Unchanged articulation with the dependent portion of the bilateral lower lobes.  No focal airspace opacities.  No pleural effusions.   Unchanged shoddy mediastinal lymph nodes within the pretracheal node measuring 1 cm grade short axis diameter and containing a benign fatty hila.  Decreased right hilar adenopathy with index of right hilar nodal conglomeration now measuring approximately 9 mm in short axis diameter, previously, 2.1 cm.   No acute aggressive osseous abnormalities.  Post median sternotomy.   IMPRESSION: 1.  Stable aneurysmal dilatation of the thoracic aorta.   2.  Unchanged appearance of a mid descending thoracic aorta short segment dissection versus ulcerated plaque. 2.  Improved right hilar adenopathy.   CTA ABDOMEN   Vascular Findings:   Abdominal aorta:  The known thoracic aortic aneurysm tapers to a normal caliber more normal caliber regional to the takeoff of the celiac artery. Minimal increase in the previously identified ectasia of the infrarenal abdominal aorta, now measuring approximately 3.0 x 2.8 cm as measured just caudal to the takeoff of the IMA (image 187, series five), previously, 2.8 x 2.8 cm.   Celiac artery:  Widely patent.  Classical branching pattern.   SMA:  Widely patent.  Classical  branching pattern.   Right renal artery:  Duplicated.  No hemodynamically significant narrowing.   Left renal artery:  A mixture of calcified and noncalcified plaque is seen at the origin of the left renal artery likely approaching hemodynamic significance, unchanged.  Again, this is without associated asymmetric atrophy of the left kidney.   IMA:  Patent without hemodynamically significant narrowing.   Pelvic vasculature: There is unchanged focal contained ulcerated plaque involving the origin of the left common iliac artery (coronal image 51, series 601).  The bilateral iliac arteries are noted to be mildly ectatic and tortuous with the right common iliac artery measuring approximately 1.3 cm in greatest coronal dimension (image 44, series 601) and left common iliac  artery measuring approximate 1.3 cm (coronal image 57, series 601).   Review of the MIP images confirms the above findings.   Nonvascular findings:   Normal hepatic contour.  No discrete hyperenhancing hepatic lesions.  Multiple gallstones are re-identified with an otherwise normal-appearing gallbladder.  No definite intra or extrahepatic biliary duct dilatation. No ascites.   There is symmetric enhancement and excretion of the bilateral kidneys.  Grossly unchanged left-sided partially exophytic renal cyst arising from the superior pole of the left kidney.  No urinary obstruction.  No perinephric stranding.  There is unchanged diffuse thickening of the bilateral adrenal glands.  Normal appearance of the pancreas and spleen.   Visualized bowel is normal in course and caliber without wall thickening or evidence of obstruction.  No pneumoperitoneum, pneumatosis or portal venous gas.  Grossly unchanged shoddy retroperitoneal lymph nodes are not enlarged by CT criteria.   No acute or aggressive osseous abnormalities.  Multilevel lumbar spine degenerative change.   IMPRESSION: 1.  Unchanged tapering of the known  thoracic aortic aneurysm at the level of the superior aspect of the abdominal aorta. 2.  Minimally increased ectasia of the infrarenal abdominal aorta now measuring approximately 3.0 x 2.8 cm, previously, 2.8 x 2.8 cm. 3.  Unchanged possible narrowing of origin of the left renal artery.   4.  Unchanged appearance of contained ulcerated plaque involving the left common iliac artery.   Original Report Authenticated By: Waynard Reeds, M.D.   Impression:  The appearance of his thoracic aortic aneurysm is grossly unchanged. The maximum diameter is the largest in the proximal descending thoracic aorta at 5.8 cm. He has diffuse aneurysmal disease involving all segments of the thoracic aorta as well as the abdominal aorta. This is not amenable to endovascular stent grafting and I don't think he is a candidate for elective replacement of his entire aorta, especially considering his age with a history of coronary bypass graft surgery in 2011. I reviewed the results of the scan with the patient and his wife and my recommendation that we continued conservative observation unless he develops any localized enlargement of the aneurysm.  Plan:  I will plan to see him back in one year with a CT angiogram of the chest and abdomen.

## 2011-09-17 ENCOUNTER — Ambulatory Visit (INDEPENDENT_AMBULATORY_CARE_PROVIDER_SITE_OTHER): Payer: Medicare Other | Admitting: Internal Medicine

## 2011-09-17 ENCOUNTER — Encounter: Payer: Self-pay | Admitting: Internal Medicine

## 2011-09-17 VITALS — BP 110/60 | HR 56 | Temp 97.8°F | Ht 68.0 in | Wt 221.0 lb

## 2011-09-17 DIAGNOSIS — I5032 Chronic diastolic (congestive) heart failure: Secondary | ICD-10-CM

## 2011-09-17 DIAGNOSIS — M1990 Unspecified osteoarthritis, unspecified site: Secondary | ICD-10-CM

## 2011-09-17 DIAGNOSIS — I4891 Unspecified atrial fibrillation: Secondary | ICD-10-CM

## 2011-09-17 DIAGNOSIS — E785 Hyperlipidemia, unspecified: Secondary | ICD-10-CM

## 2011-09-17 DIAGNOSIS — I10 Essential (primary) hypertension: Secondary | ICD-10-CM

## 2011-09-17 DIAGNOSIS — N400 Enlarged prostate without lower urinary tract symptoms: Secondary | ICD-10-CM

## 2011-09-17 DIAGNOSIS — I509 Heart failure, unspecified: Secondary | ICD-10-CM

## 2011-09-17 LAB — HEPATIC FUNCTION PANEL
ALT: 21 U/L (ref 0–53)
AST: 27 U/L (ref 0–37)
Albumin: 3.8 g/dL (ref 3.5–5.2)

## 2011-09-17 LAB — CBC WITH DIFFERENTIAL/PLATELET
Basophils Relative: 0.9 % (ref 0.0–3.0)
Eosinophils Relative: 5.2 % — ABNORMAL HIGH (ref 0.0–5.0)
HCT: 37.3 % — ABNORMAL LOW (ref 39.0–52.0)
Lymphs Abs: 0.9 10*3/uL (ref 0.7–4.0)
MCHC: 33.7 g/dL (ref 30.0–36.0)
MCV: 99.1 fl (ref 78.0–100.0)
Monocytes Absolute: 0.6 10*3/uL (ref 0.1–1.0)
Platelets: 152 10*3/uL (ref 150.0–400.0)
WBC: 4.9 10*3/uL (ref 4.5–10.5)

## 2011-09-17 LAB — TSH: TSH: 1.29 u[IU]/mL (ref 0.35–5.50)

## 2011-09-17 LAB — BASIC METABOLIC PANEL
BUN: 35 mg/dL — ABNORMAL HIGH (ref 6–23)
CO2: 31 mEq/L (ref 19–32)
Chloride: 100 mEq/L (ref 96–112)
Glucose, Bld: 58 mg/dL — ABNORMAL LOW (ref 70–99)
Potassium: 4 mEq/L (ref 3.5–5.1)

## 2011-09-17 LAB — LIPID PANEL
Cholesterol: 123 mg/dL (ref 0–200)
LDL Cholesterol: 51 mg/dL (ref 0–99)

## 2011-09-17 MED ORDER — TRAMADOL HCL 50 MG PO TABS: 50.0000 mg | ORAL_TABLET | Freq: Three times a day (TID) | ORAL | Status: DC | PRN

## 2011-09-17 MED ORDER — TRAMADOL HCL 50 MG PO TABS: 50.0000 mg | ORAL_TABLET | Freq: Two times a day (BID) | ORAL | Status: DC | PRN

## 2011-09-17 NOTE — Assessment & Plan Note (Signed)
Has been bradycardic but no symptoms Will check dig level May want to stop this or decrease dose if dizziness,etc

## 2011-09-17 NOTE — Progress Notes (Signed)
Subjective:    Patient ID: Lee Gutierrez, male    DOB: [birth year], 78 y.o.   MRN: [id number]  HPI Doing okay Variable but stable Recent visit with Dr Joneen Boers action needed  Monitoring weight---has been fairly stable Takes the metolozone if his weight goes up---only 1 per2 weeks or so  Not much stamina but stable No DOE with short distances or showering  Pain is his limiting factor Takes the acetaminophen regularly and tramadol at night Walks with rollator mostly---very small steps  No chest pain No palpitations Occ edema--if his weight is up  Current Outpatient Prescriptions on File Prior to Visit  Medication Sig Dispense Refill  . Acetaminophen (ARTHRITIS PAIN RELIEF PO) Take 1 tablet by mouth daily as needed. For pain       . amLODipine (NORVASC) 5 MG tablet Take 5 mg by mouth daily.        Marland Kitchen aspirin 325 MG tablet Take 325 mg by mouth at bedtime.        . digoxin (LANOXIN) 0.125 MG tablet Take 62.5 mcg by mouth daily.        . enalapril (VASOTEC) 20 MG tablet Take 20 mg by mouth 2 (two) times daily.        . furosemide (LASIX) 40 MG tablet Take 80 mg by mouth daily.        . metolazone (ZAROXOLYN) 2.5 MG tablet Take 2.5 mg by mouth as needed.       . metoprolol (LOPRESSOR) 100 MG tablet Take 50 mg by mouth 2 (two) times daily.       . Multiple Vitamins-Minerals (CENTRUM SILVER PO) Take 1 tablet by mouth daily.        . potassium chloride (K-DUR,KLOR-CON) 10 MEQ tablet Take 2 tablets (20 mEq total) by mouth daily.  30 tablet  0  . simvastatin (ZOCOR) 20 MG tablet Take 20 mg by mouth at bedtime.        . Tamsulosin HCl (FLOMAX) 0.4 MG CAPS TAKE ONE CAPSULE BY MOUTH DAILY AFTER MEAL  90 capsule  3  . DISCONTD: furosemide (LASIX) 40 MG tablet Take 1 tablet (40 mg total) by mouth daily.  30 tablet  0    No Known Allergies  Past Medical History  Diagnosis Date  . Hypertension   . Arthritis   . Coronary artery disease   . 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  Marland Kitchen Shortness of breath   . Aortic dissection   . Dysrhythmia     atrial fib  . Blood transfusion     Past Surgical History  Procedure Date  . Coronary artery bypass graft   . Replacement total knee     bilaterally    Family History  Problem Relation Age of Onset  . Hypertension Father   . COPD Brother   . Diabetes Neg Hx   . Cancer Neg Hx     History   Social History  . Marital Status: Married    Spouse Name: N/A    Number of Children: 3  . Years of Education: N/A   Occupational History  . retired Holiday representative    Social History Main Topics  . Smoking status: Former Games developer  . Smokeless tobacco: Never Used  . Alcohol Use: No  . Drug Use: No  . Sexually Active: Not Currently   Other Topics Concern  . Not on file   Social History Narrative   No living will. Wife to make decisions, then [place],  daughter.  Would want resuscitation attempts but no prolonged artificial ventilation. Not sure about tube feedings   Review of Systems Sleeps okay except has frequent nocturia (uses urinal) Rarely uses tylenol PM---occ mild AM hangover Appetite is fine    Objective:   Physical Exam  Constitutional: He appears well-developed and well-nourished. No distress.  Neck: Normal range of motion. Neck supple.  Cardiovascular: Normal heart sounds.  Exam reveals no gallop.   No murmur heard.      Bradycardic but close to 60 irregular  Pulmonary/Chest: Effort normal and breath sounds normal. No respiratory distress. He has no wheezes. He has no rales.  Musculoskeletal: He exhibits no edema and no tenderness.  Lymphadenopathy:    He has no cervical adenopathy.  Psychiatric: He has a normal mood and affect. His behavior is normal.          Assessment & Plan:

## 2011-09-17 NOTE — Assessment & Plan Note (Signed)
Mostly his ambulation limiting factor On tylenol and tramadol

## 2011-09-17 NOTE — Assessment & Plan Note (Signed)
Due for labs Will send to Dr Tresa Endo

## 2011-09-17 NOTE — Assessment & Plan Note (Signed)
Still with frequency and sig nocturia but stable No change

## 2011-09-17 NOTE — Assessment & Plan Note (Signed)
BP Readings from Last 3 Encounters:  09/17/11 110/60  09/02/11 132/80  05/20/11 110/70   Good control on heart meds

## 2011-09-17 NOTE — Assessment & Plan Note (Signed)
Doing well Stable functional status Takes metolozone as needed to maintain weight

## 2011-09-18 LAB — DIGOXIN LEVEL: Digoxin Level: 0.6 ng/mL — ABNORMAL LOW (ref 0.8–2.0)

## 2011-09-22 ENCOUNTER — Encounter: Payer: Self-pay | Admitting: *Deleted

## 2011-12-11 DIAGNOSIS — Z9289 Personal history of other medical treatment: Secondary | ICD-10-CM

## 2011-12-11 HISTORY — DX: Personal history of other medical treatment: Z92.89

## 2012-01-15 ENCOUNTER — Encounter: Payer: Self-pay | Admitting: Internal Medicine

## 2012-01-20 ENCOUNTER — Ambulatory Visit: Payer: Medicare Other | Admitting: Internal Medicine

## 2012-01-21 ENCOUNTER — Encounter: Payer: Self-pay | Admitting: Internal Medicine

## 2012-01-21 ENCOUNTER — Ambulatory Visit (INDEPENDENT_AMBULATORY_CARE_PROVIDER_SITE_OTHER): Payer: Medicare Other | Admitting: Internal Medicine

## 2012-01-21 VITALS — BP 104/60 | HR 51 | Temp 98.0°F | Ht 68.0 in | Wt 223.0 lb

## 2012-01-21 DIAGNOSIS — Z23 Encounter for immunization: Secondary | ICD-10-CM

## 2012-01-21 DIAGNOSIS — I4891 Unspecified atrial fibrillation: Secondary | ICD-10-CM

## 2012-01-21 DIAGNOSIS — M19079 Primary osteoarthritis, unspecified ankle and foot: Secondary | ICD-10-CM

## 2012-01-21 DIAGNOSIS — I10 Essential (primary) hypertension: Secondary | ICD-10-CM

## 2012-01-21 DIAGNOSIS — I5032 Chronic diastolic (congestive) heart failure: Secondary | ICD-10-CM

## 2012-01-21 MED ORDER — TRAMADOL HCL 50 MG PO TABS: 50.0000 mg | ORAL_TABLET | Freq: Three times a day (TID) | ORAL | Status: DC | PRN

## 2012-01-21 NOTE — Assessment & Plan Note (Signed)
BP Readings from Last 3 Encounters:  01/21/12 104/60  09/17/11 110/60  09/02/11 132/80   Good control on CHF meds

## 2012-01-21 NOTE — Assessment & Plan Note (Signed)
Has done well with diuretics and no recent exacerbations No changes needed Labs including dig level fine 5/13

## 2012-01-21 NOTE — Assessment & Plan Note (Signed)
Good rate control No coumadin due to aortic dissection

## 2012-01-21 NOTE — Assessment & Plan Note (Signed)
Now ankle is main issue since TKR Discussed optimizing medical regimen before considering surgical intervention Tylenol tid every day and can use the tramadol tid also if needed

## 2012-01-21 NOTE — Progress Notes (Signed)
Subjective:    Patient ID: Lee Gutierrez, male    DOB: 02/04/1931, 78 y.o.   MRN: [id number]  HPI Here with wife Doing about the same Just seen by cardiologist---echo and stress test were okay  Ongoing bilateral ankle pain Plans to see orthopedist again Uses the tramadol nightly to help him sleep Less pain in day---"I just live with it". Discussed using tylenol during the day also Very stiff and painful when he has been sitting for a while  Stamina is stable Walks with rollator and no SOB with his slow pace No dizziness No chest pain No palpitations Weighs daily. Tries to maintain 215# at home. Uses the metolazone occasionally (once a week or so)  Current Outpatient Prescriptions on File Prior to Visit  Medication Sig Dispense Refill  . Acetaminophen (ARTHRITIS PAIN RELIEF PO) Take 1 tablet by mouth daily as needed. For pain       . amLODipine (NORVASC) 5 MG tablet Take 5 mg by mouth daily.        Marland Kitchen aspirin 325 MG tablet Take 325 mg by mouth at bedtime.        . digoxin (LANOXIN) 0.125 MG tablet Take 62.5 mcg by mouth daily.        . enalapril (VASOTEC) 20 MG tablet Take 20 mg by mouth 2 (two) times daily.        . furosemide (LASIX) 40 MG tablet Take 80 mg by mouth daily.        Marland Kitchen KLOR-CON 10 10 MEQ tablet Take 10 mEq by mouth 2 (two) times daily.       . metolazone (ZAROXOLYN) 2.5 MG tablet Take 2.5 mg by mouth as needed.       . metoprolol (LOPRESSOR) 100 MG tablet Take 50 mg by mouth 2 (two) times daily.       . Multiple Vitamins-Minerals (CENTRUM SILVER PO) Take 1 tablet by mouth daily.        . simvastatin (ZOCOR) 20 MG tablet Take 20 mg by mouth at bedtime.        . Tamsulosin HCl (FLOMAX) 0.4 MG CAPS TAKE ONE CAPSULE BY MOUTH DAILY AFTER MEAL  90 capsule  3  . traMADol (ULTRAM) 50 MG tablet Take 1 tablet (50 mg total) by mouth 3 (three) times daily as needed. Maximum dose= 8 tablets per day.  For pain.  90 tablet  1  . DISCONTD: furosemide (LASIX) 40 MG tablet Take 1  tablet (40 mg total) by mouth daily.  30 tablet  0    No Known Allergies  Past Medical History  Diagnosis Date  . Hypertension   . Arthritis   . Coronary artery disease   . 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  Marland Kitchen Shortness of breath   . Aortic dissection   . Dysrhythmia     atrial fib  . Blood transfusion     Past Surgical History  Procedure Date  . Coronary artery bypass graft   . Replacement total knee     bilaterally    Family History  Problem Relation Age of Onset  . Hypertension Father   . COPD Brother   . Diabetes Neg Hx   . Cancer Neg Hx     History   Social History  . Marital Status: Married    Spouse Name: N/A    Number of Children: 3  . Years of Education: N/A   Occupational History  . retired Holiday representative  Social History Main Topics  . Smoking status: Former Games developer  . Smokeless tobacco: Never Used  . Alcohol Use: No  . Drug Use: No  . Sexually Active: Not Currently   Other Topics Concern  . Not on file   Social History Narrative   No living will. Wife to make decisions, then [name], daughter.  Would want resuscitation attempts but no prolonged artificial ventilation. Not sure about tube feedings   Review of Systems Sleep is still not great---trouble initiating but then sleeps okay. Enjoys after lunch nap Appetite is fine    Objective:   Physical Exam  Constitutional: He appears well-developed and well-nourished. No distress.  Neck: Normal range of motion. Neck supple.  Cardiovascular: Normal rate and normal heart sounds.  Exam reveals no gallop.   No murmur heard.      irregular  Pulmonary/Chest: Effort normal and breath sounds normal. No respiratory distress. He has no wheezes. He has no rales.  Abdominal: Soft. There is no tenderness.  Musculoskeletal: He exhibits no edema and no tenderness.  Lymphadenopathy:    He has no cervical adenopathy.  Psychiatric: He has a normal mood and affect. His  behavior is normal.          Assessment & Plan:

## 2012-03-01 ENCOUNTER — Other Ambulatory Visit: Payer: Self-pay | Admitting: Orthopedic Surgery

## 2012-03-01 DIAGNOSIS — M19079 Primary osteoarthritis, unspecified ankle and foot: Secondary | ICD-10-CM

## 2012-03-03 ENCOUNTER — Ambulatory Visit
Admission: RE | Admit: 2012-03-03 | Discharge: 2012-03-03 | Disposition: A | Discharge status: Home / Self Care | Payer: Medicare Other | Source: Ambulatory Visit | Attending: Orthopedic Surgery | Admitting: Orthopedic Surgery

## 2012-03-03 DIAGNOSIS — M19079 Primary osteoarthritis, unspecified ankle and foot: Secondary | ICD-10-CM

## 2012-04-09 ENCOUNTER — Encounter (HOSPITAL_COMMUNITY): Payer: Self-pay | Admitting: Pharmacy Technician

## 2012-04-09 NOTE — Pre-Procedure Instructions (Addendum)
20 Jaekwon Mcclune   04/09/2012   Your procedure is scheduled on: Thursday, December 12TH   Report to [facility] at 10:30 AM.  Call this number if you have problems the morning of surgery: [phone]   Remember:   Do not eat food OR drink any liquids:After Midnight Wednesday.  Take these medicines the morning of surgery with A SIP OF WATER: Norvasc, Metoprolol, Flomax, Tramadol, and Digoxin   Do not wear jewelry.  Do not wear lotions, powders, or colognes. You may NOT wear deodorant.             Men may shave face and neck.   Do not bring valuables to the hospital.  Contacts, dentures or bridgework may not be worn into surgery.   Leave suitcase in the car. After surgery it may be brought to your room.  For patients admitted to the hospital, checkout time is 11:00 AM the day of discharge.   Patients discharged the day of surgery will not be allowed to drive home.   Name and phone number of your driver:    Special Instructions: Shower using CHG 2 nights before surgery and the night before surgery.  If you shower the day of surgery use CHG.  Use special wash - you have one bottle of CHG for all showers.  You should use approximately 1/3 of the bottle for each shower.   Please read over the following fact sheets that you were given: Pain Booklet, MRSA Information and Surgical Site Infection Prevention

## 2012-04-12 ENCOUNTER — Encounter (HOSPITAL_COMMUNITY)
Admission: RE | Admit: 2012-04-12 | Discharge: 2012-04-12 | Disposition: A | Discharge status: Home / Self Care | Payer: Medicare Other | Source: Ambulatory Visit | Attending: Orthopedic Surgery | Admitting: Orthopedic Surgery

## 2012-04-12 ENCOUNTER — Encounter (HOSPITAL_COMMUNITY): Payer: Self-pay

## 2012-04-12 HISTORY — DX: Unspecified hearing loss, unspecified ear: H91.90

## 2012-04-12 LAB — BASIC METABOLIC PANEL
BUN: 27 mg/dL — ABNORMAL HIGH (ref 6–23)
CO2: 32 mEq/L (ref 19–32)
Chloride: 97 mEq/L (ref 96–112)
Creatinine, Ser: 1.01 mg/dL (ref 0.50–1.35)
Glucose, Bld: 90 mg/dL (ref 70–99)

## 2012-04-12 LAB — CBC
HCT: 35.2 % — ABNORMAL LOW (ref 39.0–52.0)
MCH: 33.1 pg (ref 26.0–34.0)
MCHC: 33.8 g/dL (ref 30.0–36.0)
MCV: 97.8 fL (ref 78.0–100.0)
RDW: 14 % (ref 11.5–15.5)

## 2012-04-12 LAB — SURGICAL PCR SCREEN: MRSA, PCR: NEGATIVE

## 2012-04-12 NOTE — Progress Notes (Signed)
Jamesetta Orleans called and left VM requesting orders for patient at Dr. Laverta Baltimore office.

## 2012-04-12 NOTE — Progress Notes (Addendum)
Patient had a stress test with Dr. Nicki Guadalajara. Records requested. PCP is Dr. Tillman Abide. Patient denied having a sleep study.

## 2012-04-13 NOTE — Consult Note (Addendum)
Anesthesia chart review: Patient is a 78 year old male scheduled for left ankle and subtalar arthrodesis by Dr. Victorino Dike on 04/22/2012. History includes former smoker, obesity, hypertension, type B aortic dissection extending into his abdominal aorta, CAD s/p CABG (LIMA to LAD, SVG to OM1, SVG to OM2) 06/2009, chronic combined CHF, afib (no Coumadin due to aortic dissection), hard of hearing, arthritis.  PCP is Dr. Tillman Abide who is aware of planned procedure and recommended cardiac clearance (see below). BP was 110/59 at his PAT visit.  Cardiologist is Dr. Nicki Guadalajara [facility]), last visit was 02/06/12.  He has cleared patient with "moderate risk."  EKG on 04/12/12 showed afib @ 59 bpm, occasional PVCs, LAD, non-specific IVCB.  It was not felt significantly changed from his prior EKG on 03/25/11.  Nuclear stress test on 01/07/2012 showed normal myocardial perfusion scan demonstrating an attenuation artifact in the inferior region of the myocardium. No ischemia or infarct/scar is seen in the remaining myocardium. Compared to the previous study, there is no significant change. No significant ischemia demonstrated. This is a low risk scan. The study was non-gated secondary to atrial fibrillation.  Echo on 01/07/12 showed the left ventricle is normal in size, moderate concentric LVH, left ventricular systolic function is moderately reduced, EF 35-45%, trans-mitral Doppler flow pattern is suggestive of restrictive physiology, grade III diastolic dysfunction. Tissue Doppler suggests increased LA and LV pressures. Moderate inferior wall hypokinesis. Right ventricle mildly dilated. Right ventricular systolic function is mildly reduced. The left atrium is severely dilated. The right atrium is severely dilated. A dilated inferior vena cava suggests increased right atrial pressure. Mitral valve leaflets appear thickened, but open well. Moderate mitral and tricuspid regurgitation. Right ventricular systolic pressure  elevated at greater than 60 mmHg. Aortic valve appeared mildly sclerotic. Mild aortic regurgitation. Mild pulmonic valvular regurgitation. Mild aortic root dilatation. No pericardial effusion.  His last cardiac cath was on 06/12/09 prior to his CABG.   CTA on 09/02/11 showed: (comparison CTA from 04/16/10) Chest: 1. Stable aneurysmal dilatation of the ascending thoracic aorta, 5.0 X 4.9 cm.  2. Unchanged appearance of the proximal descending thoracic, 5.8 X 5.1 cm. 3. Unchanged aneurysmal dilatation of the aortic arch, 5.6 X 5.5 cm.  4. Unchanged dilatation of the disatl thoracic aorta measuring approximately 3.9 X 4.1 cm just cranial to the takeoff of the celiac artery. 5. Improved right hilar adenopathy.  Abdomen: 1. Unchanged tapering of the known thoracic aortic aneurysm at the level of the superior aspect of the abdominal aorta.  2. Minimally increased ectasia of the infrarenal abdominal aorta now measuring approximately 3.0 x 2.8 cm, previously, 2.8 x 2.8 cm.  3. Unchanged possible narrowing of origin of the left renal  artery.  4. Unchanged appearance of contained ulcerated plaque involving the left common iliac artery. (He has had two CTA of the chest/abdomen since 06/2009 with both suggesting findings are "stable" or "unchanged" since then.  In February 2011, CT Surgeon Dr. Laneta Simmers did not feel patient needed repair of his dissection at that time.)  Chest x-ray on 04/12/2012 showed: 1. No active cardiopulmonary disease.  2. CABG.  3. Unchanged thoracic aortic aneurysm. No acute findings.  Pre-operative labs (CBC, BMET) noted.  Orders are still pending, re-quested.  I reviewed above with Anesthesiologist Dr. Krista Blue.  Since patient's Cardiologist and PCP are both aware of planned procedure then anticipate he can proceed if no significant change in his status.   Shonna Chock, PA-C 04/14/12 1255

## 2012-04-22 ENCOUNTER — Ambulatory Visit (HOSPITAL_COMMUNITY): Payer: Medicare Other | Admitting: Vascular Surgery

## 2012-04-22 ENCOUNTER — Encounter (HOSPITAL_COMMUNITY): Payer: Self-pay | Admitting: Vascular Surgery

## 2012-04-22 ENCOUNTER — Inpatient Hospital Stay (HOSPITAL_COMMUNITY)
Admission: RE | Admit: 2012-04-22 | Discharge: 2012-04-26 | DRG: 493 | Disposition: A | Discharge status: Skilled Nursing Facility | Payer: Medicare Other | Source: Ambulatory Visit | Attending: Orthopedic Surgery | Admitting: Orthopedic Surgery

## 2012-04-22 ENCOUNTER — Inpatient Hospital Stay (HOSPITAL_COMMUNITY): Payer: Medicare Other

## 2012-04-22 ENCOUNTER — Encounter (HOSPITAL_COMMUNITY)
Admission: RE | Disposition: A | Discharge status: Skilled Nursing Facility | Payer: Self-pay | Source: Ambulatory Visit | Attending: Orthopedic Surgery

## 2012-04-22 ENCOUNTER — Encounter (HOSPITAL_COMMUNITY): Payer: Self-pay | Admitting: *Deleted

## 2012-04-22 DIAGNOSIS — N400 Enlarged prostate without lower urinary tract symptoms: Secondary | ICD-10-CM | POA: Diagnosis present

## 2012-04-22 DIAGNOSIS — I10 Essential (primary) hypertension: Secondary | ICD-10-CM | POA: Diagnosis present

## 2012-04-22 DIAGNOSIS — I2789 Other specified pulmonary heart diseases: Secondary | ICD-10-CM | POA: Diagnosis present

## 2012-04-22 DIAGNOSIS — I509 Heart failure, unspecified: Secondary | ICD-10-CM | POA: Diagnosis present

## 2012-04-22 DIAGNOSIS — H9190 Unspecified hearing loss, unspecified ear: Secondary | ICD-10-CM | POA: Diagnosis present

## 2012-04-22 DIAGNOSIS — Z951 Presence of aortocoronary bypass graft: Secondary | ICD-10-CM

## 2012-04-22 DIAGNOSIS — I4891 Unspecified atrial fibrillation: Secondary | ICD-10-CM | POA: Diagnosis present

## 2012-04-22 DIAGNOSIS — I714 Abdominal aortic aneurysm, without rupture, unspecified: Secondary | ICD-10-CM | POA: Diagnosis present

## 2012-04-22 DIAGNOSIS — M19079 Primary osteoarthritis, unspecified ankle and foot: Principal | ICD-10-CM | POA: Diagnosis present

## 2012-04-22 DIAGNOSIS — Z79899 Other long term (current) drug therapy: Secondary | ICD-10-CM

## 2012-04-22 DIAGNOSIS — Z01812 Encounter for preprocedural laboratory examination: Secondary | ICD-10-CM

## 2012-04-22 DIAGNOSIS — Z7982 Long term (current) use of aspirin: Secondary | ICD-10-CM

## 2012-04-22 DIAGNOSIS — I2510 Atherosclerotic heart disease of native coronary artery without angina pectoris: Secondary | ICD-10-CM | POA: Diagnosis present

## 2012-04-22 DIAGNOSIS — Z87891 Personal history of nicotine dependence: Secondary | ICD-10-CM

## 2012-04-22 DIAGNOSIS — Z96659 Presence of unspecified artificial knee joint: Secondary | ICD-10-CM

## 2012-04-22 DIAGNOSIS — M129 Arthropathy, unspecified: Secondary | ICD-10-CM | POA: Diagnosis present

## 2012-04-22 DIAGNOSIS — I5042 Chronic combined systolic (congestive) and diastolic (congestive) heart failure: Secondary | ICD-10-CM | POA: Diagnosis present

## 2012-04-22 HISTORY — PX: ANKLE FUSION: SHX881

## 2012-04-22 SURGERY — ARTHRODESIS ANKLE
Anesthesia: General | Site: Ankle | Laterality: Left

## 2012-04-22 MED ORDER — LACTATED RINGERS IV SOLN
INTRAVENOUS | Status: DC
  Administered 2012-04-22: 12:00:00 via INTRAVENOUS

## 2012-04-22 MED ORDER — METHOCARBAMOL 500 MG PO TABS
500.0000 mg | ORAL_TABLET | Freq: Four times a day (QID) | ORAL | Status: DC | PRN
  Administered 2012-04-22 – 2012-04-26 (×9): 500 mg via ORAL
  Filled 2012-04-22 (×9): qty 1

## 2012-04-22 MED ORDER — LACTATED RINGERS IV SOLN
INTRAVENOUS | Status: DC | PRN
  Administered 2012-04-22 (×2): via INTRAVENOUS

## 2012-04-22 MED ORDER — BACITRACIN ZINC 500 UNIT/GM EX OINT
TOPICAL_OINTMENT | CUTANEOUS | Status: DC | PRN
  Administered 2012-04-22: 1 via TOPICAL

## 2012-04-22 MED ORDER — METOPROLOL TARTRATE 100 MG PO TABS
100.0000 mg | ORAL_TABLET | Freq: Every day | ORAL | Status: DC
  Administered 2012-04-23 – 2012-04-26 (×4): 100 mg via ORAL
  Filled 2012-04-22 (×4): qty 1

## 2012-04-22 MED ORDER — PHENYLEPHRINE HCL 10 MG/ML IJ SOLN
10.0000 mg | INTRAVENOUS | Status: DC | PRN
  Administered 2012-04-22: 40 ug/min via INTRAVENOUS

## 2012-04-22 MED ORDER — CHLORHEXIDINE GLUCONATE 4 % EX LIQD
60.0000 mL | Freq: Once | CUTANEOUS | Status: DC
Start: 2012-04-22 — End: 2012-04-22

## 2012-04-22 MED ORDER — ASPIRIN 325 MG PO TABS
325.0000 mg | ORAL_TABLET | Freq: Every day | ORAL | Status: DC
  Administered 2012-04-22 – 2012-04-25 (×4): 325 mg via ORAL
  Filled 2012-04-22 (×5): qty 1

## 2012-04-22 MED ORDER — DOCUSATE SODIUM 100 MG PO CAPS
100.0000 mg | ORAL_CAPSULE | Freq: Two times a day (BID) | ORAL | Status: DC
  Administered 2012-04-22 – 2012-04-23 (×3): 100 mg via ORAL
  Filled 2012-04-22 (×9): qty 1

## 2012-04-22 MED ORDER — CEFAZOLIN SODIUM-DEXTROSE 2-3 GM-% IV SOLR
2.0000 g | INTRAVENOUS | Status: AC
  Administered 2012-04-22: 2 g via INTRAVENOUS
  Filled 2012-04-22: qty 50

## 2012-04-22 MED ORDER — AMLODIPINE BESYLATE 5 MG PO TABS
5.0000 mg | ORAL_TABLET | Freq: Every day | ORAL | Status: DC
  Administered 2012-04-23 – 2012-04-26 (×4): 5 mg via ORAL
  Filled 2012-04-22 (×5): qty 1

## 2012-04-22 MED ORDER — OXYCODONE HCL 5 MG/5ML PO SOLN: 5.0000 mg | Freq: Once | ORAL | Status: DC | PRN

## 2012-04-22 MED ORDER — ONDANSETRON HCL 4 MG/2ML IJ SOLN: 4.0000 mg | Freq: Four times a day (QID) | INTRAMUSCULAR | Status: DC | PRN

## 2012-04-22 MED ORDER — FENTANYL CITRATE 0.05 MG/ML IJ SOLN: 50.0000 ug | INTRAMUSCULAR | Status: DC | PRN

## 2012-04-22 MED ORDER — OXYCODONE HCL 5 MG PO TABS: 5.0000 mg | ORAL_TABLET | Freq: Once | ORAL | Status: DC | PRN

## 2012-04-22 MED ORDER — OXYCODONE HCL 5 MG PO TABS
5.0000 mg | ORAL_TABLET | ORAL | Status: DC | PRN
  Administered 2012-04-22 – 2012-04-26 (×14): 10 mg via ORAL
  Filled 2012-04-22 (×14): qty 2

## 2012-04-22 MED ORDER — FENTANYL CITRATE 0.05 MG/ML IJ SOLN: 25.0000 ug | INTRAMUSCULAR | Status: DC | PRN

## 2012-04-22 MED ORDER — FUROSEMIDE 80 MG PO TABS
80.0000 mg | ORAL_TABLET | Freq: Every day | ORAL | Status: DC
  Administered 2012-04-22 – 2012-04-26 (×5): 80 mg via ORAL
  Filled 2012-04-22 (×5): qty 1

## 2012-04-22 MED ORDER — POTASSIUM CHLORIDE ER 10 MEQ PO TBCR
20.0000 meq | EXTENDED_RELEASE_TABLET | Freq: Every day | ORAL | Status: DC
  Administered 2012-04-22 – 2012-04-26 (×5): 20 meq via ORAL
  Filled 2012-04-22 (×5): qty 2

## 2012-04-22 MED ORDER — ENOXAPARIN SODIUM 40 MG/0.4ML ~~LOC~~ SOLN
40.0000 mg | SUBCUTANEOUS | Status: DC
  Filled 2012-04-22 (×2): qty 0.4

## 2012-04-22 MED ORDER — MIDAZOLAM HCL 2 MG/2ML IJ SOLN
INTRAMUSCULAR | Status: AC
  Filled 2012-04-22: qty 2

## 2012-04-22 MED ORDER — LIDOCAINE HCL (CARDIAC) 20 MG/ML IV SOLN
INTRAVENOUS | Status: DC | PRN
  Administered 2012-04-22: 60 mg via INTRAVENOUS

## 2012-04-22 MED ORDER — DIGOXIN 0.0625 MG HALF TABLET
0.0625 mg | ORAL_TABLET | Freq: Every day | ORAL | Status: DC
  Administered 2012-04-23 – 2012-04-26 (×4): 0.0625 mg via ORAL
  Filled 2012-04-22 (×4): qty 1

## 2012-04-22 MED ORDER — TAMSULOSIN HCL 0.4 MG PO CAPS
0.4000 mg | ORAL_CAPSULE | Freq: Every evening | ORAL | Status: DC
  Administered 2012-04-22 – 2012-04-25 (×4): 0.4 mg via ORAL
  Filled 2012-04-22 (×5): qty 1

## 2012-04-22 MED ORDER — ENALAPRIL MALEATE 20 MG PO TABS
20.0000 mg | ORAL_TABLET | Freq: Two times a day (BID) | ORAL | Status: DC
  Administered 2012-04-22 – 2012-04-26 (×8): 20 mg via ORAL
  Filled 2012-04-22 (×9): qty 1

## 2012-04-22 MED ORDER — SODIUM CHLORIDE 0.9 % IR SOLN
Status: DC | PRN
  Administered 2012-04-22: 13:00:00

## 2012-04-22 MED ORDER — ONDANSETRON HCL 4 MG PO TABS: 4.0000 mg | ORAL_TABLET | Freq: Four times a day (QID) | ORAL | Status: DC | PRN

## 2012-04-22 MED ORDER — FENTANYL CITRATE 0.05 MG/ML IJ SOLN
INTRAMUSCULAR | Status: DC | PRN
  Administered 2012-04-22 (×3): 50 ug via INTRAVENOUS
  Administered 2012-04-22: 100 ug via INTRAVENOUS
  Administered 2012-04-22: 50 ug via INTRAVENOUS

## 2012-04-22 MED ORDER — ARTIFICIAL TEARS OP OINT
TOPICAL_OINTMENT | OPHTHALMIC | Status: DC | PRN
  Administered 2012-04-22: 1 via OPHTHALMIC

## 2012-04-22 MED ORDER — BACITRACIN ZINC 500 UNIT/GM EX OINT
TOPICAL_OINTMENT | CUTANEOUS | Status: AC
  Filled 2012-04-22: qty 15

## 2012-04-22 MED ORDER — ADULT MULTIVITAMIN W/MINERALS CH
1.0000 | ORAL_TABLET | Freq: Every day | ORAL | Status: DC
  Administered 2012-04-22 – 2012-04-26 (×5): 1 via ORAL
  Filled 2012-04-22 (×5): qty 1

## 2012-04-22 MED ORDER — SENNA 8.6 MG PO TABS
2.0000 | ORAL_TABLET | Freq: Two times a day (BID) | ORAL | Status: DC
  Administered 2012-04-22 – 2012-04-23 (×3): 17.2 mg via ORAL
  Filled 2012-04-22 (×9): qty 2

## 2012-04-22 MED ORDER — METHOCARBAMOL 100 MG/ML IJ SOLN
500.0000 mg | Freq: Four times a day (QID) | INTRAVENOUS | Status: DC | PRN
  Filled 2012-04-22: qty 5

## 2012-04-22 MED ORDER — HYDROMORPHONE HCL PF 1 MG/ML IJ SOLN
0.5000 mg | INTRAMUSCULAR | Status: DC | PRN
  Administered 2012-04-23: 1 mg via INTRAVENOUS
  Filled 2012-04-22: qty 1

## 2012-04-22 MED ORDER — FENTANYL CITRATE 0.05 MG/ML IJ SOLN
INTRAMUSCULAR | Status: AC
  Filled 2012-04-22: qty 2

## 2012-04-22 MED ORDER — PROPOFOL 10 MG/ML IV BOLUS
INTRAVENOUS | Status: DC | PRN
  Administered 2012-04-22: 180 mg via INTRAVENOUS

## 2012-04-22 MED ORDER — SODIUM CHLORIDE 0.9 % IV SOLN: INTRAVENOUS | Status: DC

## 2012-04-22 MED ORDER — GLYCOPYRROLATE 0.2 MG/ML IJ SOLN
INTRAMUSCULAR | Status: DC | PRN
  Administered 2012-04-22: 0.2 mg via INTRAVENOUS

## 2012-04-22 MED ORDER — MAGNESIUM CITRATE PO SOLN
1.0000 | Freq: Once | ORAL | Status: AC | PRN
  Filled 2012-04-22: qty 296

## 2012-04-22 MED ORDER — FENTANYL CITRATE 0.05 MG/ML IJ SOLN
50.0000 ug | Freq: Once | INTRAMUSCULAR | Status: AC
  Administered 2012-04-22: 50 ug via INTRAVENOUS

## 2012-04-22 MED ORDER — ONDANSETRON HCL 4 MG/2ML IJ SOLN
INTRAMUSCULAR | Status: DC | PRN
  Administered 2012-04-22: 4 mg via INTRAVENOUS

## 2012-04-22 MED ORDER — SIMVASTATIN 20 MG PO TABS
20.0000 mg | ORAL_TABLET | Freq: Every day | ORAL | Status: DC
  Administered 2012-04-22 – 2012-04-25 (×4): 20 mg via ORAL
  Filled 2012-04-22 (×5): qty 1

## 2012-04-22 MED ORDER — METOLAZONE 2.5 MG PO TABS
2.5000 mg | ORAL_TABLET | Freq: Every day | ORAL | Status: DC | PRN
  Filled 2012-04-22: qty 1

## 2012-04-22 MED ORDER — MIDAZOLAM HCL 2 MG/2ML IJ SOLN: 1.0000 mg | INTRAMUSCULAR | Status: DC | PRN

## 2012-04-22 MED ORDER — CEFAZOLIN SODIUM-DEXTROSE 2-3 GM-% IV SOLR
2.0000 g | Freq: Four times a day (QID) | INTRAVENOUS | Status: AC
  Administered 2012-04-22 – 2012-04-23 (×3): 2 g via INTRAVENOUS
  Filled 2012-04-22 (×3): qty 50

## 2012-04-22 SURGICAL SUPPLY — 69 items
Ankle locking nail 12x180mm ×1 IMPLANT
BANDAGE ESMARK 6X9 LF (GAUZE/BANDAGES/DRESSINGS) ×1 IMPLANT
BIT DRILL CALIBRATED 4.3X320MM (BIT) IMPLANT
BIT DRILL CANN 7X200 (BIT) ×1 IMPLANT
BLADE SAW SGTL HD 18.5X60.5X1. (BLADE) ×2 IMPLANT
BLADE SURG 10 STRL SS (BLADE) ×2 IMPLANT
BNDG CMPR 9X6 STRL LF SNTH (GAUZE/BANDAGES/DRESSINGS) ×1
BNDG COHESIVE 4X5 TAN STRL (GAUZE/BANDAGES/DRESSINGS) ×2 IMPLANT
BNDG COHESIVE 6X5 TAN STRL LF (GAUZE/BANDAGES/DRESSINGS) ×2 IMPLANT
BNDG ESMARK 6X9 LF (GAUZE/BANDAGES/DRESSINGS) ×2
CHLORAPREP W/TINT 10.5 ML (MISCELLANEOUS) ×2 IMPLANT
CHLORAPREP W/TINT 26ML (MISCELLANEOUS) ×2 IMPLANT
CLOTH BEACON ORANGE TIMEOUT ST (SAFETY) ×2 IMPLANT
COVER SURGICAL LIGHT HANDLE (MISCELLANEOUS) ×2 IMPLANT
CUFF TOURNIQUET SINGLE 34IN LL (TOURNIQUET CUFF) ×2 IMPLANT
CUFF TOURNIQUET SINGLE 44IN (TOURNIQUET CUFF) IMPLANT
DRAPE C-ARM 42X72 X-RAY (DRAPES) ×2 IMPLANT
DRAPE C-ARMOR (DRAPES) ×2 IMPLANT
DRAPE INCISE IOBAN 66X45 STRL (DRAPES) ×2 IMPLANT
DRAPE U-SHAPE 47X51 STRL (DRAPES) ×2 IMPLANT
DRILL CALIBRATED 4.3X320MM (BIT) ×2
DRSG ADAPTIC 3X8 NADH LF (GAUZE/BANDAGES/DRESSINGS) ×1 IMPLANT
DRSG PAD ABDOMINAL 8X10 ST (GAUZE/BANDAGES/DRESSINGS) ×1 IMPLANT
DRSG TEGADERM 4X4.75 (GAUZE/BANDAGES/DRESSINGS) ×2 IMPLANT
ELECT REM PT RETURN 9FT ADLT (ELECTROSURGICAL) ×2
ELECTRODE REM PT RTRN 9FT ADLT (ELECTROSURGICAL) ×1 IMPLANT
GAUZE SPONGE 2X2 8PLY STRL LF (GAUZE/BANDAGES/DRESSINGS) ×1 IMPLANT
GLOVE BIO SURGEON STRL SZ8 (GLOVE) ×2 IMPLANT
GLOVE BIOGEL PI IND STRL 8 (GLOVE) ×1 IMPLANT
GLOVE BIOGEL PI INDICATOR 8 (GLOVE) ×1
GOWN PREVENTION PLUS XLARGE (GOWN DISPOSABLE) ×2 IMPLANT
GOWN STRL NON-REIN LRG LVL3 (GOWN DISPOSABLE) ×4 IMPLANT
GUIDEPIN 3.2X17.5 THRD DISP (PIN) ×2 IMPLANT
GUIDEWIRE BEAD TIP (WIRE) ×1 IMPLANT
KIT BASIN OR (CUSTOM PROCEDURE TRAY) ×2 IMPLANT
KIT ROOM TURNOVER OR (KITS) ×2 IMPLANT
MANIFOLD NEPTUNE II (INSTRUMENTS) ×2 IMPLANT
NEEDLE 22X1 1/2 (OR ONLY) (NEEDLE) IMPLANT
NS IRRIG 1000ML POUR BTL (IV SOLUTION) ×2 IMPLANT
PACK ORTHO EXTREMITY (CUSTOM PROCEDURE TRAY) ×2 IMPLANT
PAD ARMBOARD 7.5X6 YLW CONV (MISCELLANEOUS) ×4 IMPLANT
PAD CAST 4YDX4 CTTN HI CHSV (CAST SUPPLIES) ×1 IMPLANT
PADDING CAST COTTON 4X4 STRL (CAST SUPPLIES) ×4
PADDING CAST COTTON 6X4 STRL (CAST SUPPLIES) ×1 IMPLANT
SCREW CORT TI DBL LEAD 5X24 (Screw) ×2 IMPLANT
SCREW CORT TI DBL LEAD 5X46 (Screw) ×1 IMPLANT
SCREW CORT TI DBL LEAD 5X70 (Screw) ×1 IMPLANT
SOAP 2 % CHG 4 OZ (WOUND CARE) ×2 IMPLANT
SPLINT PLASTER CAST XFAST 5X30 (CAST SUPPLIES) IMPLANT
SPLINT PLASTER XFAST SET 5X30 (CAST SUPPLIES) ×1
SPONGE GAUZE 2X2 STER 10/PKG (GAUZE/BANDAGES/DRESSINGS) ×1
SPONGE GAUZE 4X4 12PLY (GAUZE/BANDAGES/DRESSINGS) ×1 IMPLANT
SPONGE LAP 18X18 X RAY DECT (DISPOSABLE) ×2 IMPLANT
STAPLER VISISTAT 35W (STAPLE) IMPLANT
STRIP CLOSURE SKIN 1/2X4 (GAUZE/BANDAGES/DRESSINGS) ×2 IMPLANT
SUCTION FRAZIER TIP 10 FR DISP (SUCTIONS) ×2 IMPLANT
SUT MNCRL AB 3-0 PS2 18 (SUTURE) ×1 IMPLANT
SUT PROLENE 3 0 PS 2 (SUTURE) ×2 IMPLANT
SUT VIC AB 0 CT2 27 (SUTURE) ×1 IMPLANT
SUT VIC AB 2-0 CT1 27 (SUTURE) ×4
SUT VIC AB 2-0 CT1 TAPERPNT 27 (SUTURE) ×2 IMPLANT
SUT VIC AB 3-0 PS2 18 (SUTURE) ×2
SUT VIC AB 3-0 PS2 18XBRD (SUTURE) ×1 IMPLANT
SYR BULB IRRIGATION 50ML (SYRINGE) ×1 IMPLANT
SYR CONTROL 10ML LL (SYRINGE) IMPLANT
TOWEL OR 17X24 6PK STRL BLUE (TOWEL DISPOSABLE) ×2 IMPLANT
TOWEL OR 17X26 10 PK STRL BLUE (TOWEL DISPOSABLE) ×2 IMPLANT
TUBE CONNECTING 12X1/4 (SUCTIONS) ×2 IMPLANT
WATER STERILE IRR 1000ML POUR (IV SOLUTION) ×2 IMPLANT

## 2012-04-22 NOTE — Transfer of Care (Signed)
Immediate Anesthesia Transfer of Care Note  Patient: Lee Gutierrez  Procedure(s) Performed: Procedure(s) (LRB) with comments: ARTHRODESIS ANKLE (Left) - Left Ankle and Subtalar Arthrodesis   Patient Location: PACU  Anesthesia Type:General  Level of Consciousness: awake, alert  and patient cooperative  Airway & Oxygen Therapy: Patient Spontanous Breathing and Patient connected to face mask oxygen  Post-op Assessment: Report given to PACU RN, Post -op Vital signs reviewed and stable and Patient moving all extremities  Post vital signs: Reviewed and stable  Complications: No apparent anesthesia complications

## 2012-04-22 NOTE — Anesthesia Procedure Notes (Signed)
Anesthesia Regional Block:  Popliteal block  Pre-Anesthetic Checklist: ,, timeout performed, Correct Patient, Correct Site, Correct Laterality, Correct Procedure, Correct Position, site marked, Risks and benefits discussed,  Surgical consent,  Pre-op evaluation,  At surgeon's request and post-op pain management  Laterality: Left  Prep: chloraprep       Needles:  Injection technique: Single-shot  Needle Type: Echogenic Stimulator Needle          Additional Needles:  Procedures: ultrasound guided (picture in chart) and nerve stimulator Popliteal block  Nerve Stimulator or Paresthesia:  Response: plantar flexion of foot, 0.45 mA,   Additional Responses:   Narrative:  Start time: 04/22/2012 12:06 PM End time: 04/22/2012 12:18 PM Injection made incrementally with aspirations every 5 mL.  Performed by: Personally  Anesthesiologist: Dr Chaney Malling  Additional Notes: Functioning IV was confirmed and monitors were applied.  A 90mm 21ga Arrow echogenic stimulator needle was used. Sterile prep and drape,hand hygiene and sterile gloves were used.  Negative aspiration and negative test dose prior to incremental administration of local anesthetic. The patient tolerated the procedure well.  Ultrasound guidance: relevent anatomy identified, needle position confirmed, local anesthetic spread visualized around nerve(s), vascular puncture avoided.  Image printed for medical record.   Popliteal block

## 2012-04-22 NOTE — Progress Notes (Signed)
CSW received message from Dr. Victorino Dike that patient will need SNF placement. Patient has not arrived on the floor. CSW will meet with patient in the am to discuss placement.  CSW will continue to follow acutely.  Sabino Niemann, MSW, [employer] [phone]

## 2012-04-22 NOTE — Brief Op Note (Signed)
04/22/2012  3:39 PM  PATIENT:  Lee Gutierrez  78 y.o. male  PRE-OPERATIVE DIAGNOSIS:  Left ankle and  subtalar arthritis   POST-OPERATIVE DIAGNOSIS:  Left ankle and subtalar arthritis   Procedure(s): 1.  Left ankle arthrodesis 2.  Left subtalar joint arthrodesis 3.  fluro > 1 hour  SURGEON:  Toni Arthurs, MD  ASSISTANT: n/a  ANESTHESIA:   General, regional  EBL:  minimal   TOURNIQUET:   Total Tourniquet Time Documented: Thigh (Left) - 122 minutes  COMPLICATIONS:  None apparent  DISPOSITION:  Extubated, awake and stable to recovery.  DICTATION ID:  [id number]

## 2012-04-22 NOTE — OR Nursing (Signed)
Bilateral hearing aids placed in pink cup with patient label. Will transfer with patient to PACU post-operatively.

## 2012-04-22 NOTE — H&P (Signed)
Lee Gutierrez is an 78 y.o. male.   Chief Complaint: left ankle pain HPI: 78 y/o male with left ankle and subtalar joint arthritis.  He presents now for left ankle and subtalar joint arthrodeses.  He has failed treatment with bracing, activity modification, oral pain meds and steroid injections.  Past Medical History  Diagnosis Date  . Hypertension   . Arthritis   . Coronary artery disease   . 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  Marland Kitchen Shortness of breath   . Aortic dissection   . Dysrhythmia     atrial fib  . Blood transfusion   . CHF, acute on chronic 03/24/2011  . HOH (hard of hearing)     bilateral hearing aids    Past Surgical History  Procedure Date  . Replacement total knee     bilaterally  . Joint replacement     Bilateral knee  . Tonsillectomy   . Hernia repair   . Cardiac catheterization   . Coronary artery bypass graft Feb 2011    Family History  Problem Relation Age of Onset  . Hypertension Father   . COPD Brother   . Diabetes Neg Hx   . Cancer Neg Hx    Social History:  reports that he has quit smoking. He has never used smokeless tobacco. He reports that he does not drink alcohol or use illicit drugs.  Allergies: No Known Allergies  Medications Prior to Admission  Medication Sig Dispense Refill  . amLODipine (NORVASC) 5 MG tablet Take 5 mg by mouth daily.        . digoxin (LANOXIN) 0.125 MG tablet Take 0.0625 mg by mouth daily.       . diphenhydramine-acetaminophen (TYLENOL PM) 25-500 MG TABS Take 2 tablets by mouth at bedtime as needed. For sleep or pain      . enalapril (VASOTEC) 20 MG tablet Take 20 mg by mouth 2 (two) times daily.       . furosemide (LASIX) 40 MG tablet Take 80 mg by mouth daily.      Marland Kitchen KLOR-CON 10 10 MEQ tablet Take 20 mEq by mouth daily.       . metolazone (ZAROXOLYN) 2.5 MG tablet Take 2.5 mg by mouth daily as needed. 1/2 hour before furosemide for excessive swelling in legs      . metoprolol  (LOPRESSOR) 50 MG tablet Take 100 mg by mouth daily.      . simvastatin (ZOCOR) 20 MG tablet Take 20 mg by mouth at bedtime.       . Tamsulosin HCl (FLOMAX) 0.4 MG CAPS Take 0.4 mg by mouth every evening.      . traMADol (ULTRAM) 50 MG tablet Take 25-50 mg by mouth 3 times/day as needed-between meals & bedtime. Maximum dose= 8 tablets per day.  For pain or cough       . amoxicillin (AMOXIL) 500 MG capsule Take 2,000 mg by mouth as needed. Prior to dental appointment      . aspirin 325 MG tablet Take 325 mg by mouth at bedtime.       . Multiple Vitamin (MULTIVITAMIN WITH MINERALS) TABS Take 1 tablet by mouth daily. Centrum Silver        No results found for this or any previous visit (from the past 48 hour(s)). No results found.  ROS  No recent f/c/nv/wt loss.  Blood pressure 127/83, pulse 79, temperature 97.9 F (36.6 C), temperature source Oral, resp. rate 18, SpO2  100.00%. Physical Exam wn wd male in nad.  A and O x 4.  Mood and affect normal.  Hard of hearing.  EOMI.  Respiratinos unlabored.  L ankle with effusion and decreased ROM.  SKin healthy and intact.  Pulses palpable.  5/5 strength in PF and DF of toes.  Sens to LT intact at foot and ankle.  Assessment/Plan L ankle and subtalar joint arthritis - to OR for arthrodeses of ankle and subtalar joints.  The risks and benefits of the alternative treatment options have been discussed in detail.  The patient wishes to proceed with surgery and specifically understands risks of bleeding, infection, nerve damage, blood clots, need for additional surgery, amputation and death.   Toni Arthurs 05-18-12, 12:39 PM

## 2012-04-22 NOTE — Anesthesia Preprocedure Evaluation (Signed)
Anesthesia Evaluation  Patient identified by MRN, date of birth, ID band Patient awake    Reviewed: Allergy & Precautions, H&P , NPO status , Patient's Chart, lab work & pertinent test results  Airway Mallampati: II  Neck ROM: full    Dental   Pulmonary shortness of breath, former smoker,          Cardiovascular hypertension, + CAD, + CABG, + Peripheral Vascular Disease and +CHF + dysrhythmias Atrial Fibrillation     Neuro/Psych    GI/Hepatic   Endo/Other    Renal/GU      Musculoskeletal  (+) Arthritis -,   Abdominal   Peds  Hematology   Anesthesia Other Findings   Reproductive/Obstetrics                           Anesthesia Physical Anesthesia Plan  ASA: III  Anesthesia Plan: General and Regional   Post-op Pain Management: MAC Combined w/ Regional for Post-op pain   Induction: Intravenous  Airway Management Planned: LMA  Additional Equipment:   Intra-op Plan:   Post-operative Plan: Extubation in OR  Informed Consent: I have reviewed the patients History and Physical, chart, labs and discussed the procedure including the risks, benefits and alternatives for the proposed anesthesia with the patient or authorized representative who has indicated his/her understanding and acceptance.     Plan Discussed with: CRNA and Surgeon  Anesthesia Plan Comments:         Anesthesia Quick Evaluation

## 2012-04-22 NOTE — Preoperative (Signed)
Beta Blockers   Reason not to administer Beta Blockers:Not Applicable 

## 2012-04-22 NOTE — Anesthesia Postprocedure Evaluation (Signed)
Anesthesia Post Note  Patient: Lee Gutierrez  Procedure(s) Performed: Procedure(s) (LRB): ARTHRODESIS ANKLE (Left)  Anesthesia type: General  Patient location: PACU  Post pain: Pain level controlled and Adequate analgesia  Post assessment: Post-op Vital signs reviewed, Patient's Cardiovascular Status Stable, Respiratory Function Stable, Patent Airway and Pain level controlled  Last Vitals:  Filed Vitals:   04/22/12 1630  BP: 111/60  Pulse: 81  Temp:   Resp: 14    Post vital signs: Reviewed and stable  Level of consciousness: awake, alert  and oriented  Complications: No apparent anesthesia complications

## 2012-04-23 ENCOUNTER — Encounter (HOSPITAL_COMMUNITY): Payer: Self-pay | Admitting: Orthopedic Surgery

## 2012-04-23 NOTE — Evaluation (Signed)
Occupational Therapy Evaluation Patient Details Name: Lee Gutierrez MRN: [id number] DOB: [birth year] Today's Date: 04/23/2012 Time: 7829-5621 OT Time Calculation (min): 36 min  OT Assessment / Plan / Recommendation Clinical Impression  Pleasant 78 yr old male admitted for left ankle arthrodesis.  Pt presents with overall decreased independence with basic selfcare tasks and functional transfers. He is also currently unable to maintain his NWBing status in the LLE.  Feel he will benefit from acute care OT to help increase overall independence with basic selfcare tasks in order to eventually return home with his wife.  However based on his inability to follow NWBing over the LLE and his current need for +2 assistance, he will need SNF for further rehab.      OT Assessment  Patient needs continued OT Services    Follow Up Recommendations  SNF    Barriers to Discharge Decreased caregiver support    Equipment Recommendations  3 in 1 bedside comode       Frequency  Min 2X/week    Precautions / Restrictions Precautions Precautions: Fall Restrictions Weight Bearing Restrictions: Yes LLE Weight Bearing: Non weight bearing   Pertinent Vitals/Pain Pain 2-4/10 in the LLE, repositioned LE    ADL  Eating/Feeding: Simulated;Independent Where Assessed - Eating/Feeding: Chair Grooming: Simulated;Set up Where Assessed - Grooming: Supported sitting Upper Body Bathing: Simulated;Set up Where Assessed - Upper Body Bathing: Supported sitting Lower Body Bathing: Simulated;+2 Total assistance Lower Body Bathing: Patient Percentage: 40% Where Assessed - Lower Body Bathing: Supported sit to stand Upper Body Dressing: Simulated;Set up Where Assessed - Upper Body Dressing: Unsupported sitting Lower Body Dressing: Simulated;+2 Total assistance Lower Body Dressing: Patient Percentage: 40% Where Assessed - Lower Body Dressing: Supported sit to stand Toilet Transfer: Simulated;+2 Total  assistance Toilet Transfer: Patient Percentage: 40% Statistician Method: Surveyor, minerals: Materials engineer and Hygiene: Simulated;+2 Total assistance Toileting - Architect and Hygiene: Patient Percentage: 40% Where Assessed - Engineer, mining and Hygiene: Sit to stand from 3-in-1 or toilet Tub/Shower Transfer Method: Not assessed Transfers/Ambulation Related to ADLs: Pt currently needs total +2 (pt 40%) for stand pivot transfers to bedside chair using the RW.  Currently unable to maintain NWBing status in the LLE. ADL Comments: Pt needs total assist +2 for simulated LB selfcare and toilet transfers.  He is unable to correctly follow NWBing status in the LLE with transfers or sit to stand.      OT Diagnosis: Generalized weakness;Acute pain  OT Problem List: Decreased strength;Decreased activity tolerance;Impaired balance (sitting and/or standing);Decreased knowledge of use of DME or AE;Decreased knowledge of precautions;Pain OT Treatment Interventions: Self-care/ADL training;Therapeutic activities;Balance training;Patient/family education   OT Goals Acute Rehab OT Goals OT Goal Formulation: With patient/family Time For Goal Achievement: 05/07/12 Potential to Achieve Goals: Fair ADL Goals Pt Will Perform Lower Body Bathing: with mod assist;Sit to stand from bed;Supported ADL Goal: Lower Body Bathing - Progress: Goal set today Pt Will Perform Lower Body Dressing: with mod assist;Sit to stand from bed;Supported ADL Goal: Lower Body Dressing - Progress: Goal set today Pt Will Transfer to Toilet: with mod assist;Stand pivot transfer;with DME;Maintaining weight bearing status;3-in-1 ADL Goal: Toilet Transfer - Progress: Goal set today Pt Will Perform Toileting - Clothing Manipulation: Sitting on 3-in-1 or toilet;Standing;with mod assist ADL Goal: Toileting - Clothing Manipulation - Progress: Goal set today Pt  Will Perform Toileting - Hygiene: with mod assist;Sit to stand from 3-in-1/toilet ADL Goal: Toileting - Hygiene - Progress: Goal set  today  Visit Information  Last OT Received On: 04/23/12 Assistance Needed: +2    Subjective Data  Subjective: I can't hear too good. Patient Stated Goal: Did not state.   Prior Functioning     Home Living Lives With: Spouse Available Help at Discharge: Skilled Nursing Facility Type of Home: House Home Layout: One level Bathroom Shower/Tub: Health visitor: Standard Home Adaptive Equipment: Engineer, drilling - four wheeled;Straight cane Prior Function Level of Independence: Needs assistance Needs Assistance: Meal Prep;Light Housekeeping Meal Prep: Moderate Light Housekeeping: Total Able to Take Stairs?: Yes Vocation: Retired Musician: HOH         Vision/Perception Vision - Naval architect: Within Systems developer Vision Assessment: Vision not tested Clinical biochemist: Within Functional Limits Praxis Praxis: Intact   Cognition  Overall Cognitive Status: Appears within functional limits for tasks assessed/performed Arousal/Alertness: Awake/alert Orientation Level: Appears intact for tasks assessed Behavior During Session: [facility] for tasks performed    Extremity/Trunk Assessment Right Upper Extremity Assessment RUE ROM/Strength/Tone: Within functional levels RUE Sensation: WFL - Light Touch RUE Coordination: WFL - gross/fine motor Left Upper Extremity Assessment LUE ROM/Strength/Tone: Within functional levels LUE Sensation: WFL - Light Touch LUE Coordination: WFL - gross/fine motor Trunk Assessment Trunk Assessment: Kyphotic     Mobility Transfers Transfers: Sit to Stand;Stand to Sit Sit to Stand: 1: +2 Total assist;From chair/3-in-1;With upper extremity assist Sit to Stand: Patient Percentage: 40% Stand to Sit: 1: +2 Total assist;Without upper extremity assist;To  chair/3-in-1           Balance Balance Balance Assessed: Yes Static Standing Balance Static Standing - Balance Support: Bilateral upper extremity supported Static Standing - Level of Assistance: 2: Max assist   End of Session OT - End of Session Equipment Utilized During Treatment: Gait belt Activity Tolerance: Patient limited by fatigue Patient left: in chair;with call bell/phone within reach;with family/visitor present Nurse Communication: Mobility status    Jamel Holzmann OTR/L 04/23/2012, 4:31 PM

## 2012-04-23 NOTE — Clinical Documentation Improvement (Signed)
POA DOCUMENTATION CLARIFICATION QUERY  THIS DOCUMENT IS NOT A PERMANENT PART OF THE MEDICAL RECORD  TO RESPOND TO THE THIS QUERY, FOLLOW THE INSTRUCTIONS BELOW:  1. If needed, update documentation for the patient's encounter via the notes activity.  2. Access this query again and click edit on the In [employer].  3. After updating, or not, click F2 to complete all highlighted (required) fields concerning your review. Select "additional documentation in the medical record" OR "no additional documentation provided".  4. Click Sign note button.  5. The deficiency will fall out of your In Basket *Please let us know if you are not able to complete this workflow by phone or e-mail (listed below).  Please update your documentation within the medical record to reflect your response to this query.                                                                                    04/23/12  Dear Dr. Toni Arthurs  In a better effort to capture your patient's severity of illness, reflect appropriate length of stay and utilization of resources, a review of the patient medical record has revealed the following indicators.    Based on your clinical judgment, please clarify and document in a progress note and/or discharge summary the clinical condition associated with the following supporting information:  In responding to this query please exercise your independent judgment.  The fact that a query is asked, does not imply that any particular answer is desired or expected.  Medicare rules require specification as to whether an inpatient diagnosis was present at the time of admission.    Per patient's previous discharge summary "acute on chronic combined systolic and diastolic heart failure" was a diagnosis.  During this current hospital visit he continues on his Lasix 80 mg po daily.  Would the diagnosis below be  an appropriate secondary diagnosis? Thank you    Please clarify if the following  diagnosis,            chronic combined systolic and diastolic heart failure   was:     X Present at the time of admission  _ NOT present at the time of inpatient admission and it developed during the inpatient stay  _ Unable to clinically determine whether the condition was present on admission.  _  Documentation insufficient to determine if condition was present at the time of inpatient admission  You may use possible, probable, or suspect with inpatient documentation. possible, probable, suspected diagnoses MUST be documented at the time of discharge  Reviewed: additional documentation in the medical record      Thank You,  Leonette Most Addison  Clinical Documentation Specialist, BSN: Pager [phone]  HIM off # [phone] ( M-F)  Health Information Management [place]

## 2012-04-23 NOTE — Op Note (Signed)
Lee Gutierrez, Lee Gutierrez NO.:  [id number]  MEDICAL RECORD NO.:  [id number]  LOCATION:  5N24C                        FACILITY:  [facility]  PHYSICIAN:  Toni Arthurs, MD        DATE OF BIRTH:  Jan 05, 1931  DATE OF PROCEDURE:  04/22/2012 DATE OF DISCHARGE:                              OPERATIVE REPORT   PREOPERATIVE DIAGNOSES:  Left ankle and subtalar joint arthritis.  POSTOPERATIVE DIAGNOSES:  Left ankle and subtalar joint arthritis.  PROCEDURE: 1. Left ankle arthrodesis. 2. Left subtalar joint arthrodesis. 3. Intraoperative interpretation of fluoroscopic imaging greater than     1 hour.  SURGEON:  Toni Arthurs, MD.  ANESTHESIA:  General, regional.  ESTIMATED BLOOD LOSS:  Minimal.  TOURNIQUET TIME:  Two hours and 2 minutes at 250 mmHg.  COMPLICATIONS:  None apparent.  DISPOSITION:  Extubated awake and stable to recovery.  INDICATION FOR PROCEDURE:  The patient is a 78 year old male who has a long history of left ankle and hindfoot pain.  X-rays reveal a severely's arthritic ankle and subtalar joints.  He has failed treatment with activity modification, oral pain medications, bracing, and steroid injections.  He presents now for operative treatment of this condition. He understands the risks and benefits, the alternative treatment options and elects surgical treatment.  He specifically understands risks of bleeding, infection, nerve damage, blood clots, need for additional surgery, amputation, and death.  PROCEDURE IN DETAIL:  After preoperative consent was obtained, the correct operative site was identified, the patient was brought to the operating room and placed supine on the operating table.  General anesthesia was induced.  Preoperative antibiotics were administered. Surgical time-out was taken.  Left lower extremity was prepped and draped in standard sterile fashion.  The extremity was exsanguinated and tourniquet was inflated to 250 mmHg.  A longitudinal  incision was made over the posterior aspect of the fibula.  Sharp dissection was carried down through skin and subcutaneous tissue.  Subperiosteal dissection was then carried around the fibula anteriorly and posteriorly taking care to protect the peroneal tendons.  The fibula was then transected obliquely with an nonoscillating saw proximal to the level of the ankle joint. The distal fibula was then removed in its entirety and passed off the field to the back table where it was morcellized for bone graft.  The ankle joint was then exposed.  The remaining articular cartilage was removed with curettes and rongeurs.  The wound was irrigated copiously. Blunt dissection was then carried down the lateral wall of the talus to the subtalar joint.  Subtalar joint was opened and also cleaned of all articular cartilage.  The wound was irrigated copiously.  The 3.5-mm drill bit was used to perforate subchondral bone in multiple locations on the superior and inferior aspects of the joint.  This was also carried out to the ankle joint.  Bone graft was then packed into the ankle and subtalar joints.  Both joints were then reduced.  A guide pin was placed through the plantar surface of the heel just at the anterior edge of the heel pad.  It was passed up through the calcaneus talus and into the distal tibia.  AP and lateral  fluoroscopic images confirmed appropriate reduction of the joint and appropriate position and length of the pin.  The pin was measured 100 and 180-mm nail was selected.  The longitudinal incision was made on the plantar surface of the foot or at the guide pin.  Blunt dissection was carried down through the subcutaneous tissue.  Care was taken to protect the plantar subcutaneous tissues and the guide pin was over drilled with a starting reamer.  The ball-tip guidewire was then placed up through the hole in the talus and up into the distal tibia.  The guide pin was then sequentially  reamed to a diameter of 13 mm.  A 12 mm x 180 mm [employer] [name] nail was selected and inserted over the guide pin.  It was advanced to the appropriate depth.  The talar interlocking screw was then inserted from lateral to medial after confirming appropriate position on the x-rays.  The positioning guide was then turned to the medial aspect of the ankle and 2 static interlocking screws were inserted in percutaneous fashion at the proximal end of the nail.  The compression device was then tightened and several millimeters of compression was achieved across the ankle joint.  At this point, the compression device on the plantar surface of the foot was tightened compressing the subtalar joint.  While this was compressed rather the targeting guide was then positioned in the posterior to anterior direction.  A stab incision was made, and a interlocking screw was inserted into the calcaneus and through the nail. Final AP and lateral views showed appropriate reduction of the ankle and subtalar joints and appropriate compression.  The hardware was all of the appropriate length.  The targeting jig was removed.  All the wounds were irrigated copiously.  Horizontal mattress sutures were used to close the stab incisions and the incision on the plantar surface of the foot.  A 0 Vicryl inverted simple sutures were used to close the deep subcutaneous tissues over the lateral incision.  The subcutaneous tissue was approximated with inverted simple sutures of 3-0 Monocryl, running 3- 0 Prolene was used to close the skin incision.  Sterile dressings were applied followed by a well-padded short-leg splint.  Tourniquet was released at 2 hours and 2 minutes after application of the splint.  The patient was then awakened from anesthesia and transported to recovery room in stable condition.  FOLLOWUP PLAN:  The patient will be nonweightbearing on his left lower extremity.  He will likely need physical therapy  and occupational therapy at a rehab setting for at least a few weeks.  He will follow up with me in 2 weeks for suture removal and conversion to cast.     Toni Arthurs, MD     JH/MEDQ  D:  04/22/2012  T:  04/23/2012  Job:  [id number]

## 2012-04-23 NOTE — Progress Notes (Signed)
Subjective: 1 Day Post-Op Procedure(s) (LRB): ARTHRODESIS ANKLE (Left) Patient reports pain as mild and block has worn off..  2 person max assist with PT this morning.  Objective: Vital signs in last 24 hours: Temp:  [97.4 F (36.3 C)-99 F (37.2 C)] 98.9 F (37.2 C) (12/13 0500) Pulse Rate:  [47-81] 69  (12/13 0500) Resp:  [14-22] 18  (12/13 0500) BP: (92-142)/(46-81) 139/70 mmHg (12/13 0500) SpO2:  [96 %-100 %] 97 % (12/13 0500)  Intake/Output from previous day: 12/12 0701 - 12/13 0700 In: 2780 [P.O.:1040; I.V.:1740] Out: 1425 [Urine:1425] Intake/Output this shift:    No results found for this basename: HGB:5 in the last 72 hours No results found for this basename: WBC:2,RBC:2,HCT:2,PLT:2 in the last 72 hours No results found for this basename: NA:2,K:2,CL:2,CO2:2,BUN:2,CREATININE:2,GLUCOSE:2,CALCIUM:2 in the last 72 hours No results found for this basename: LABPT:2,INR:2 in the last 72 hours  L LE splinted.  NVI at toes.  Assessment/Plan: 1 Day Post-Op Procedure(s) (LRB): ARTHRODESIS ANKLE (Left) Up with therapy  Plan snf placement per PT / OT recs.  D/c lovenox.  Pt has a dx of  chronic combined systolic and diastolic heart failure at the time of admission.  Lee Gutierrez 04/23/2012, 1:58 PM

## 2012-04-23 NOTE — Evaluation (Signed)
Physical Therapy Evaluation Patient Details Name: Lee Gutierrez MRN: [id number] DOB: [birth year] Today's Date: 04/23/2012 Time: 4098-1191 PT Time Calculation (min): 25 min  PT Assessment / Plan / Recommendation Clinical Impression  pt rpesents with L ankle arthrodesis.  pt unable to maintain NWBing on L LE.  Would almost require 3rd person to A with transfer to maintain NWBing.  pt will need SNF at D/C to maximize Independence to return home with wife.      PT Assessment  Patient needs continued PT services    Follow Up Recommendations  SNF    Does the patient have the potential to tolerate intense rehabilitation      Barriers to Discharge None      Equipment Recommendations  Wheelchair (measurements PT);Wheelchair cushion (measurements PT) (3-in-1)    Recommendations for Other Services OT consult   Frequency Min 5X/week    Precautions / Restrictions Precautions Precautions: Fall Restrictions Weight Bearing Restrictions: Yes LLE Weight Bearing: Non weight bearing   Pertinent Vitals/Pain Indicates L ankle aches.        Mobility  Bed Mobility Bed Mobility: Supine to Sit;Sitting - Scoot to Edge of Bed Supine to Sit: 1: +2 Total assist Supine to Sit: Patient Percentage: 60% Sitting - Scoot to Edge of Bed: 1: +1 Total assist Details for Bed Mobility Assistance: cues for sequencing and technique.   Transfers Transfers: Sit to Stand;Stand to Sit;Stand Pivot Transfers Sit to Stand: 1: +2 Total assist;With upper extremity assist;From bed Sit to Stand: Patient Percentage: 40% Stand to Sit: 1: +2 Total assist;With upper extremity assist;To chair/3-in-1;With armrests Stand to Sit: Patient Percentage: 40% Stand Pivot Transfers: 1: +2 Total assist Stand Pivot Transfers: Patient Percentage: 40% Details for Transfer Assistance: cues for safe technique, UE use, NWBing on L LE.  pt unable to maintain NWBing even with PT's foot under pt's and max cueing.    Ambulation/Gait Ambulation/Gait Assistance: Not tested (comment) Stairs: No Wheelchair Mobility Wheelchair Mobility: No    Shoulder Instructions     Exercises     PT Diagnosis: Difficulty walking;Generalized weakness  PT Problem List: Decreased strength;Decreased activity tolerance;Decreased balance;Decreased mobility;Decreased knowledge of use of DME;Decreased knowledge of precautions;Pain PT Treatment Interventions: DME instruction;Gait training;Functional mobility training;Therapeutic activities;Therapeutic exercise;Balance training;Patient/family education   PT Goals Acute Rehab PT Goals PT Goal Formulation: With patient/family Time For Goal Achievement: 05/07/12 Potential to Achieve Goals: Good Pt will go Supine/Side to Sit: with min assist PT Goal: Supine/Side to Sit - Progress: Goal set today Pt will go Sit to Supine/Side: with min assist PT Goal: Sit to Supine/Side - Progress: Goal set today Pt will go Sit to Stand: with min assist PT Goal: Sit to Stand - Progress: Goal set today Pt will go Stand to Sit: with min assist PT Goal: Stand to Sit - Progress: Goal set today Pt will Transfer Bed to Chair/Chair to Bed: with min assist PT Transfer Goal: Bed to Chair/Chair to Bed - Progress: Goal set today Pt will Stand: with min assist;3 - 5 min;with bilateral upper extremity support PT Goal: Stand - Progress: Goal set today Pt will Ambulate: 1 - 15 feet;with +2 total assist;with rolling walker PT Goal: Ambulate - Progress: Goal set today  Visit Information  Last PT Received On: 04/23/12 Assistance Needed: +2    Subjective Data  Subjective: We're renting one of those knee scooters.   Patient Stated Goal: Ankle to heal.     Prior Functioning  Home Living Lives With: Spouse Available Help at Discharge: Skilled  Nursing Facility Type of Home: House Home Layout: One level Bathroom Shower/Tub: Health visitor: Standard Home Adaptive Equipment: Sport and exercise psychologist - four wheeled;Straight cane Prior Function Level of Independence: Needs assistance Needs Assistance: Meal Prep;Light Housekeeping Meal Prep: Moderate Light Housekeeping: Total Able to Take Stairs?: Yes Vocation: Retired Musician: Clinical cytogeneticist  Overall Cognitive Status: Appears within functional limits for tasks assessed/performed Arousal/Alertness: Awake/alert Orientation Level: Appears intact for tasks assessed Behavior During Session: [facility] for tasks performed    Extremity/Trunk Assessment Right Lower Extremity Assessment RLE ROM/Strength/Tone: Deficits RLE ROM/Strength/Tone Deficits: Generally weak, grossly 4-/5 RLE Sensation: WFL - Light Touch Left Lower Extremity Assessment LLE ROM/Strength/Tone: Deficits LLE ROM/Strength/Tone Deficits: At this time pt unable to lift LE against gravity without A.   LLE Sensation: WFL - Light Touch Trunk Assessment Trunk Assessment: Kyphotic   Balance Balance Balance Assessed: No  End of Session PT - End of Session Equipment Utilized During Treatment: Gait belt Activity Tolerance: Patient tolerated treatment well Patient left: in chair;with call bell/phone within reach;with family/visitor present Nurse Communication: Mobility status  GP     Sunny Schlein, [place] [phone] 04/23/2012, 12:06 PM

## 2012-04-23 NOTE — Clinical Social Work Placement (Addendum)
Clinical Social Work Department  CLINICAL SOCIAL WORK PLACEMENT NOTE  04/23/2012  Patient: Lee Gutierrez Account Number:  [id number]  Admit date: 04/22/12 Clinical Social Worker: Sabino Niemann MSW Date/time: 04/23/12 4:00 PM Clinical Social Work is seeking post-discharge placement for this patient at the following level of care: SKILLED NURSING (*CSW will update this form in Epic as items are completed)  04/23/12 Patient/family provided with [facility] Department of Clinical Social Work's list of facilities offering this level of care within the geographic area requested by the patient (or if unable, by the patient's family).  04/23/12 Patient/family informed of their freedom to choose among providers that offer the needed level of care, that participate in Medicare, Medicaid or managed care program needed by the patient, have an available bed and are willing to accept the patient.  04/23/12 Patient/family informed of [facility]' ownership interest in [facility], as well as of the fact that they are under no obligation to receive care at this facility.  PASARR submitted to EDS on  PASARR number received from EDS on FL2 transmitted to all facilities in geographic area requested by pt/family on 112/13/13  FL2 transmitted to all facilities within larger geographic area on  Patient informed that his/her managed care company has contracts with or will negotiate with certain facilities, including the following:  Patient/family informed of bed offers received:  Patient chooses bed at  Physician recommends and patient chooses bed at  Patient to be transferred to on  Patient to be transferred to facility by  The following physician request were entered in Epic:  Additional Comments:   Sabino Niemann, MSW, [employer] [phone]

## 2012-04-23 NOTE — Clinical Social Work Psychosocial (Signed)
Clinical Social Work Department  BRIEF PSYCHOSOCIAL ASSESSMENT  Patient: Lee Gutierrez  Account Number: [id number] Admit date: 04/22/12 Clinical Social Worker Date/Time:  Referred by: Physician Date Referred: 04/23/12 Referred for   SNF Placement   Other Referral:  Interview type: Patient  Other interview type: Patient's wife was present PSYCHOSOCIAL DATA  Living Status: FAMILY  Admitted from facility:  Level of care:  Primary support name: Daxtin Leiker Primary support relationship to patient:  wife Degree of support available:  Strong and vested  CURRENT CONCERNS  Current Concerns   Post-Acute Placement   Other Concerns:  SOCIAL WORK ASSESSMENT / PLAN  CSW met with pt re: PT recommendation for SNF.   Pt lives with his wife in [place].   CSW explained placement process and answered questions.   Pt reports [facility] is his 1st preference and [facility] is their second choice    CSW completed FL2 and initiated [employer].   Weekday CSW to f/u with offers.   Assessment/plan status: Information/Referral to [facility]  Other assessment/ plan:  Information/referral to community resources:  SNF   PTAR   PATIENT'S/FAMILY'S RESPONSE TO PLAN OF CARE:  Pt  And pt's husband report they are agreeable to ST SNF in order to increase strength and independence with mobility prior to return home with her grandson. Pt verbalized understanding of placement process and appreciation for CSW assist.   Sabino Niemann, MSW [phone]

## 2012-04-24 NOTE — Progress Notes (Signed)
Physical Therapy Treatment Patient Details Name: Lee Gutierrez MRN: [id number] DOB: [birth year] Today's Date: 04/24/2012 Time: 4098-1191 PT Time Calculation (min): 16 min  PT Assessment / Plan / Recommendation Comments on Treatment Session  Patient still with difficulty maintaining NWB.  Patient also very impulsive during session - attempting to stand without proper assistance and immediately after PT stated to wait.  Definitely feel that patient will need SNF for safety and progressive PT.      Follow Up Recommendations  SNF     Does the patient have the potential to tolerate intense rehabilitation     Barriers to Discharge        Equipment Recommendations  Wheelchair (measurements PT);Wheelchair cushion (measurements PT) (3in1)    Recommendations for Other Services    Frequency Min 3X/week   Plan Discharge plan remains appropriate;Frequency needs to be updated (decrease frequency due to limited progress)    Precautions / Restrictions Precautions Precautions: Fall Restrictions LLE Weight Bearing: Non weight bearing   Pertinent Vitals/Pain No significant pain    Mobility  Bed Mobility Bed Mobility: Supine to Sit Supine to Sit: 4: Min guard;With rails Sitting - Scoot to Edge of Bed: 4: Min guard;With rail Details for Bed Mobility Assistance: assist to scoot forward on bed Transfers Transfers: Sit to Stand;Stand to Sit;Squat Pivot Transfers Sit to Stand: 1: +2 Total assist Sit to Stand: Patient Percentage: 40% Stand to Sit: 1: +2 Total assist Stand to Sit: Patient Percentage: 40% Squat Pivot Transfers: 1: +2 Total assist Squat Pivot Transfers: Patient Percentage: 40% Details for Transfer Assistance: cues for safe technique, UE use, NWBing on L LE. pt unable to maintain NWBing even with PT's foot under pt's and max cueing; unable to reach full upright in standing    Exercises     PT Diagnosis:    PT Problem List:   PT Treatment Interventions:     PT Goals Acute  Rehab PT Goals PT Goal: Supine/Side to Sit - Progress: Progressing toward goal PT Goal: Sit to Stand - Progress: Progressing toward goal PT Goal: Stand to Sit - Progress: Progressing toward goal PT Transfer Goal: Bed to Chair/Chair to Bed - Progress: Progressing toward goal PT Goal: Stand - Progress: Progressing toward goal  Visit Information  Last PT Received On: 04/24/12 Assistance Needed: +1    Subjective Data  Subjective: I can stand, let's go   Cognition  Overall Cognitive Status: Impaired Area of Impairment: Safety/judgement Arousal/Alertness: Awake/alert Orientation Level: Appears intact for tasks assessed Behavior During Session: [facility] for tasks performed Safety/Judgement: Impulsive;Decreased safety judgement for tasks assessed;Decreased awareness of need for assistance Safety/Judgement - Other Comments: attempting to stand immediately after PT instructed him to wait    Balance     End of Session PT - End of Session Equipment Utilized During Treatment: Gait belt Activity Tolerance: Patient tolerated treatment well Patient left: in chair;with call bell/phone within reach Nurse Communication: Mobility status   GP     Olivia Canter, [place] [phone] 04/24/2012, 12:46 PM

## 2012-04-24 NOTE — Progress Notes (Signed)
Subjective: 2 Days Post-Op Procedure(s) (LRB): ARTHRODESIS ANKLE (Left) Patient reports pain as mild.    Objective: Vital signs in last 24 hours: Temp:  [97.3 F (36.3 C)-98.4 F (36.9 C)] 98.3 F (36.8 C) (12/14 0610) Pulse Rate:  [67-71] 69  (12/14 0610) Resp:  [18] 18  (12/14 0610) BP: (101-118)/(50-62) 118/61 mmHg (12/14 0610) SpO2:  [95 %-98 %] 98 % (12/14 0610) Weight:  [104.327 kg (230 lb)] 104.327 kg (230 lb) (12/13 2020)  Intake/Output from previous day: 12/13 0701 - 12/14 0700 In: 1520 [P.O.:1320; I.V.:200] Out: 1150 [Urine:1150] Intake/Output this shift:    No results found for this basename: HGB:5 in the last 72 hours No results found for this basename: WBC:2,RBC:2,HCT:2,PLT:2 in the last 72 hours No results found for this basename: NA:2,K:2,CL:2,CO2:2,BUN:2,CREATININE:2,GLUCOSE:2,CALCIUM:2 in the last 72 hours No results found for this basename: LABPT:2,INR:2 in the last 72 hours  Dressing clean, dry, intact. No change neurovascular status   Assessment/Plan: 2 Days Post-Op Procedure(s) (LRB): ARTHRODESIS ANKLE (Left) Up with therapy D/C planning SNF placement  Daffney Greenly A 04/24/2012, 1:17 PM

## 2012-04-25 NOTE — Progress Notes (Signed)
Subjective: 3 Days Post-Op Procedure(s) (LRB): ARTHRODESIS ANKLE (Left) Patient reports pain as better.    Objective: Vital signs in last 24 hours: Temp:  [98 F (36.7 C)-99 F (37.2 C)] 98 F (36.7 C) (12/15 0501) Pulse Rate:  [62-72] 72  (12/15 0501) Resp:  [18-20] 20  (12/15 0501) BP: (103-130)/(63-69) 130/63 mmHg (12/15 0501) SpO2:  [94 %-96 %] 96 % (12/15 0501)  Intake/Output from previous day: 12/14 0701 - 12/15 0700 In: 900 [P.O.:900] Out: 2402 [Urine:2400; Stool:2] Intake/Output this shift:    No results found for this basename: HGB:5 in the last 72 hours No results found for this basename: WBC:2,RBC:2,HCT:2,PLT:2 in the last 72 hours No results found for this basename: NA:2,K:2,CL:2,CO2:2,BUN:2,CREATININE:2,GLUCOSE:2,CALCIUM:2 in the last 72 hours No results found for this basename: LABPT:2,INR:2 in the last 72 hours  Dressing clean, dry, intact. No change neurovascular status  Assessment/Plan: 3 Days Post-Op Procedure(s) (LRB): ARTHRODESIS ANKLE (Left) Up with therapy D/c planning SNF  Lee Gutierrez A 04/25/2012, 10:56 AM

## 2012-04-26 ENCOUNTER — Encounter: Payer: Self-pay | Admitting: Internal Medicine

## 2012-04-26 MED ORDER — SENNA 8.6 MG PO TABS: 2.0000 | ORAL_TABLET | Freq: Two times a day (BID) | ORAL | Status: DC

## 2012-04-26 MED ORDER — OXYCODONE HCL 5 MG PO TABS: 5.0000 mg | ORAL_TABLET | ORAL | Status: DC | PRN

## 2012-04-26 MED ORDER — DSS 100 MG PO CAPS: 100.0000 mg | ORAL_CAPSULE | Freq: Two times a day (BID) | ORAL | Status: DC

## 2012-04-26 NOTE — Discharge Summary (Signed)
Physician Discharge Summary  Patient ID: Lee Gutierrez MRN: [id number] DOB/AGE: Feb 10, 1931 78 y.o.  Admit date: 04/22/2012 Discharge date: 04/26/2012  Admission Diagnoses:  Abdominal aortic aneurysm, hard of hearing, CAD, BPH, CHF, pulmonary htn, a fib., left ankle and subtalar joint arthritis  Discharge Diagnoses:  Same s/p left ankle and subtalar joint arthrodeses  Discharged Condition: stable  Hospital Course: Pt was admitted on 12/12 and underwent left ankle and subtalar joint arthrodeses.  He tolerated the surgery well and was admitted to 5N.  PT and OT both recommended SNF placement for acute rehab.  He otherwise did well and is discharged to SNF today.  He'll need continued PTand OT and is NWB on the L LE.  Consults: None  Significant Diagnostic Studies: none  Treatments: therapies: PT and OT  Discharge Exam: Blood pressure 134/63, pulse 67, temperature 98.4 F (36.9 C), temperature source Oral, resp. rate 18, height 5\' 10"  (1.778 m), weight 104.327 kg (230 lb), SpO2 98.00%. wn wd male in nad.  HOH.  A and O x 4.  Mood and affect normal.  EOMI.  Respiratiions unlabored. L ankle splinted.  NVI at toes.  Disposition:SNF  Discharge Orders    Future Appointments: Provider: Department: Dept Phone: Center:   05/26/2012 10:45 AM Karie Schwalbe, MD [facility] [phone] [id number]     Future Orders Please Complete By Expires   Diet - low sodium heart healthy      Call MD / Call 911      Comments:   If you experience chest pain or shortness of breath, CALL 911 and be transported to the hospital emergency room.  If you develope a fever above 101 F, pus (white drainage) or increased drainage or redness at the wound, or calf pain, call your surgeon's office.   Constipation Prevention      Comments:   Drink plenty of fluids.  Prune juice may be helpful.  You may use a stool softener, such as Colace (over the counter) 100 mg twice a day.  Use MiraLax  (over the counter) for constipation as needed.   Increase activity slowly as tolerated      Non weight bearing      Comments:   Left foot.       Medication List     As of 04/26/2012  7:30 AM    STOP taking these medications         traMADol 50 MG tablet   Commonly known as: ULTRAM      TAKE these medications         amLODipine 5 MG tablet   Commonly known as: NORVASC   Take 5 mg by mouth daily.      amoxicillin 500 MG capsule   Commonly known as: AMOXIL   Take 2,000 mg by mouth as needed. Prior to dental appointment      aspirin 325 MG tablet   Take 325 mg by mouth at bedtime.      digoxin 0.125 MG tablet   Commonly known as: LANOXIN   Take 0.0625 mg by mouth daily.      diphenhydramine-acetaminophen 25-500 MG Tabs   Commonly known as: TYLENOL PM   Take 2 tablets by mouth at bedtime as needed. For sleep or pain      DSS 100 MG Caps   Take 100 mg by mouth 2 (two) times daily.      enalapril 20 MG tablet   Commonly known as: VASOTEC  Take 20 mg by mouth 2 (two) times daily.      furosemide 40 MG tablet   Commonly known as: LASIX   Take 80 mg by mouth daily.      KLOR-CON 10 10 MEQ tablet   Generic drug: potassium chloride   Take 20 mEq by mouth daily.      metolazone 2.5 MG tablet   Commonly known as: ZAROXOLYN   Take 2.5 mg by mouth daily as needed. 1/2 hour before furosemide for excessive swelling in legs      metoprolol 50 MG tablet   Commonly known as: LOPRESSOR   Take 100 mg by mouth daily.      multivitamin with minerals Tabs   Take 1 tablet by mouth daily. Centrum Silver      oxyCODONE 5 MG immediate release tablet   Commonly known as: Oxy IR/ROXICODONE   Take 1-2 tablets (5-10 mg total) by mouth every 4 (four) hours as needed for pain.      senna 8.6 MG Tabs   Commonly known as: SENOKOT   Take 2 tablets (17.2 mg total) by mouth 2 (two) times daily.      simvastatin 20 MG tablet   Commonly known as: ZOCOR   Take 20 mg by mouth at  bedtime.      Tamsulosin HCl 0.4 MG Caps   Commonly known as: FLOMAX   Take 0.4 mg by mouth every evening.           Follow-up Information    Schedule an appointment as soon as possible for a visit with Toni Arthurs, MD.   Contact information:   [address] [place] [address] [phone]          Signed: Toni Arthurs 04/26/2012, 7:30 AM

## 2012-04-26 NOTE — Progress Notes (Signed)
Physical Therapy Treatment Patient Details Name: Lee Gutierrez MRN: [id number] DOB: 1930/08/01 Today's Date: 04/26/2012 Time: 4098-1191 PT Time Calculation (min): 17 min  PT Assessment / Plan / Recommendation Comments on Treatment Session  pt presents with L ankle arthrodesis.  pt very HOH and question if this is leading to pt seeming impulsive as he tries to "guess" what is being told to him.  Still with difficulty maintaining NWBing on L LE.      Follow Up Recommendations  SNF     Does the patient have the potential to tolerate intense rehabilitation     Barriers to Discharge        Equipment Recommendations  Wheelchair (measurements PT);Wheelchair cushion (measurements PT) (3-in-1)    Recommendations for Other Services    Frequency Min 3X/week   Plan Discharge plan remains appropriate;Frequency needs to be updated    Precautions / Restrictions Precautions Precautions: Fall Restrictions Weight Bearing Restrictions: Yes LLE Weight Bearing: Non weight bearing   Pertinent Vitals/Pain Denies pain.      Mobility  Bed Mobility Bed Mobility: Supine to Sit;Sitting - Scoot to Edge of Bed Supine to Sit: 4: Min assist;With rails;HOB elevated Sitting - Scoot to [place] of Bed: 4: Min assist Details for Bed Mobility Assistance: A with L LE as pt tends to push into bed with L LE to A with bed mobility.   Transfers Transfers: Sit to Stand;Stand to Sit;Stand Pivot Transfers Sit to Stand: 1: +2 Total assist;With upper extremity assist;From bed Sit to Stand: Patient Percentage: 40% Stand to Sit: 1: +2 Total assist;With upper extremity assist;To chair/3-in-1 Stand to Sit: Patient Percentage: 40% Stand Pivot Transfers: 1: +2 Total assist Stand Pivot Transfers: Patient Percentage: 40% Details for Transfer Assistance: cues for UE use, NWBing on L LE even with PT's foot under pt's.  pt with better ability to stand with max cueing for technique and NWBing.  pt able to stand with RW +2 pt 70%.    Ambulation/Gait Ambulation/Gait Assistance: Not tested (comment) Stairs: No Wheelchair Mobility Wheelchair Mobility: No    Exercises General Exercises - Lower Extremity Long Arc Quad: AROM;Both;15 reps   PT Diagnosis:    PT Problem List:   PT Treatment Interventions:     PT Goals Acute Rehab PT Goals Time For Goal Achievement: 05/07/12 Potential to Achieve Goals: Good PT Goal: Supine/Side to Sit - Progress: Partly met PT Goal: Sit to Stand - Progress: Progressing toward goal PT Goal: Stand to Sit - Progress: Progressing toward goal PT Transfer Goal: Bed to Chair/Chair to Bed - Progress: Progressing toward goal PT Goal: Stand - Progress: Progressing toward goal  Visit Information  Last PT Received On: 04/26/12 Assistance Needed: +2    Subjective Data  Subjective: I don't know if they're gonna get me to the rehab today or not.     Cognition  Overall Cognitive Status: Difficult to assess Area of Impairment: Safety/judgement Difficult to assess due to: Hard of hearing/deaf Arousal/Alertness: Awake/alert Orientation Level: Appears intact for tasks assessed Behavior During Session: [facility] for tasks performed Safety/Judgement: Impulsive;Decreased safety judgement for tasks assessed;Decreased awareness of need for assistance Cognition - Other Comments: ? if impulsiveness is due to being [facility].  pt seems to hear some of what is said to him and then starts to act on it and at times seemsas if he is trying to guess the rest of what was said.      Balance  Balance Balance Assessed: Yes Static Standing Balance Static Standing - Balance  Support: Bilateral upper extremity supported Static Standing - Level of Assistance: 1: +2 Total assist  End of Session PT - End of Session Equipment Utilized During Treatment: Gait belt Activity Tolerance: Patient tolerated treatment well Patient left: in chair;with call bell/phone within reach;with family/visitor present Nurse Communication:  Mobility status   GP     Sunny Schlein, [place] [phone] 04/26/2012, 9:09 AM

## 2012-04-26 NOTE — Progress Notes (Signed)
Both times pt awakened after taking oxy IR, he was disoriented to time and place. He was able to reorient himself both times. Wife called after second episode because pt called her at 1 am and thought it was daytime. Bed alarm on.

## 2012-04-26 NOTE — Progress Notes (Signed)
Report called to [facility] rehab center using the [employer]. Report given to [place], Char[facility] fundraiser. All questions answered. Discharge sheet given to patients wife about follow up appointment.

## 2012-05-12 ENCOUNTER — Encounter: Payer: Self-pay | Admitting: Internal Medicine

## 2012-05-26 ENCOUNTER — Ambulatory Visit: Payer: Medicare Other | Admitting: Internal Medicine

## 2012-06-12 ENCOUNTER — Encounter: Payer: Self-pay | Admitting: Internal Medicine

## 2012-07-10 ENCOUNTER — Encounter: Payer: Self-pay | Admitting: Internal Medicine

## 2012-07-30 ENCOUNTER — Other Ambulatory Visit: Payer: Self-pay | Admitting: *Deleted

## 2012-07-30 DIAGNOSIS — I712 Thoracic aortic aneurysm, without rupture: Secondary | ICD-10-CM

## 2012-08-03 ENCOUNTER — Other Ambulatory Visit: Payer: Self-pay | Admitting: *Deleted

## 2012-08-03 DIAGNOSIS — I712 Thoracic aortic aneurysm, without rupture: Secondary | ICD-10-CM

## 2012-08-16 ENCOUNTER — Encounter: Payer: Self-pay | Admitting: Orthopedic Surgery

## 2012-09-08 ENCOUNTER — Ambulatory Visit: Payer: Medicare Other | Admitting: Surgery

## 2012-09-08 ENCOUNTER — Ambulatory Visit
Admission: RE | Admit: 2012-09-08 | Discharge: 2012-09-08 | Disposition: A | Discharge status: Home / Self Care | Payer: Medicare Other | Source: Ambulatory Visit | Attending: Surgery | Admitting: Surgery

## 2012-09-08 ENCOUNTER — Ambulatory Visit (INDEPENDENT_AMBULATORY_CARE_PROVIDER_SITE_OTHER): Payer: Medicare Other | Admitting: Surgery

## 2012-09-08 ENCOUNTER — Encounter: Payer: Self-pay | Admitting: Surgery

## 2012-09-08 VITALS — BP 139/74 | HR 50 | Resp 18 | Ht 70.0 in | Wt 230.0 lb

## 2012-09-08 DIAGNOSIS — I712 Thoracic aortic aneurysm, without rupture: Secondary | ICD-10-CM

## 2012-09-08 MED ORDER — IOHEXOL 350 MG/ML SOLN
80.0000 mL | Freq: Once | INTRAVENOUS | Status: AC | PRN
  Administered 2012-09-08: 80 mL via INTRAVENOUS

## 2012-09-08 NOTE — Progress Notes (Signed)
[address]       [place] [address]             [phone]      HPI:  The patient returns to my office today for followup of an ascending, arch, and descending thoracoabdominal aneurysm with a limited, chronic,type B aortic dissection. I last saw him on 09/02/2011 at which time a CT angiogram of the chest and abdomen showed stable aneurysmal dilatation of the aorta. Over the past year he denies any chest or back pain. 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 He underwent fusion of his left ankle on 04/22/2012 and is still trying to recover from that.    Current Outpatient Prescriptions  Medication Sig Dispense Refill  . amLODipine (NORVASC) 5 MG tablet Take 5 mg by mouth daily.        Marland Kitchen amoxicillin (AMOXIL) 500 MG capsule Take 2,000 mg by mouth as needed. Prior to dental appointment      . aspirin 325 MG tablet Take 325 mg by mouth at bedtime.       . Cholecalciferol (VITAMIN D) 2000 UNITS tablet Take 2,000 Units by mouth daily.      . digoxin (LANOXIN) 0.125 MG tablet Take 0.0625 mg by mouth daily.       . diphenhydramine-acetaminophen (TYLENOL PM) 25-500 MG TABS Take 2 tablets by mouth at bedtime as needed. For sleep or pain      . enalapril (VASOTEC) 20 MG tablet Take 20 mg by mouth 2 (two) times daily.       . furosemide (LASIX) 40 MG tablet Take 80 mg by mouth daily.      Marland Kitchen KLOR-CON 10 10 MEQ tablet Take 20 mEq by mouth daily.       . metolazone (ZAROXOLYN) 2.5 MG tablet Take 2.5 mg by mouth daily as needed. 1/2 hour before furosemide for excessive swelling in legs      . metoprolol (LOPRESSOR) 50 MG tablet Take 100 mg by mouth daily.      . Multiple Vitamin (MULTIVITAMIN WITH MINERALS) TABS Take 1 tablet by mouth daily. Centrum Silver      . simvastatin (ZOCOR) 20 MG tablet Take 20 mg by mouth at bedtime.       . Tamsulosin HCl (FLOMAX) 0.4 MG CAPS Take 0.4 mg by mouth every evening.       No  current facility-administered medications for this visit.     Physical Exam: BP 139/74  Pulse 50  Resp 18  Ht 5\' 10"  (1.778 m)  Wt 230 lb (104.327 kg)  BMI 33 kg/m2  SpO2 98%  He looks well. Cardiac exam shows an irregular rate and rhythm. There is a 1/6 systolic murmur. There is no diastolic murmur. Lungs are clear. There is mild bilateral lower extremity edema to the knee.   Diagnostic Tests:  *RADIOLOGY REPORT*   Clinical Data: Thoracic aortic aneurysm   CT ANGIOGRAPHY CHEST   Technique:  Multidetector CT imaging of the chest using the standard protocol during bolus administration of intravenous contrast. Multiplanar reconstructed images including MIPs were obtained and reviewed to evaluate the vascular anatomy.   Contrast: 80mL OMNIPAQUE IOHEXOL 350 MG/ML SOLN   Comparison: 09/02/2011   Findings: Overall stable appearance of the atherosclerotic thoracic aortic aneurysm.  Measuring at similar levels, the ascending thoracic aorta is stable measuring 4.8 x 4.6 cm.  The proximal descending thoracic aorta is stable measuring 5.8 x  5.2 cm.  Stable atherosclerotic aneurysmal dilatation of the transverse aorta. Major branch vessels remain atherosclerotic and tortuous but patent.  Stable focal outpouching of the proximal descending thoracic aorta laterally with an unchanged intimal flap appearance compatible with a focal dissection versus ulcerated plaque formation.  No definite aortic wall thickening or hyperattenuation to suggest intramural hemorrhage.  No mediastinal hematoma or pericardial effusion.  Native coronary calcifications noted.  The patient appears status post coronary bypass.  Stable heart size.   No pleural effusion.   Upper abdomen demonstrates patency of the upper abdominal aorta. Celiac, SMA, and renal arteries all appear stable.  Stable left upper pole renal cyst measuring 2 cm.  Cholelithiasis again evident.  No acute finding in the upper  abdomen.  Degenerative changes noted of the thoracic spine diffusely.   Lung windows demonstrate stable basilar atelectasis / scarring.  No acute airspace process, collapse, consolidation, edema, or interstitial process.  Trachea and central airways appear patent. No suspicious pulmonary nodule or mass.   IMPRESSION: Stable thoracic aortic aneurysm.   Stable descending thoracic aorta focal dissection versus ulcerated plaque   No acute intrathoracic process or interval change.     Original Report Authenticated By: Judie Petit. Miles Costain, M.D.   Impression:  He has stable aneurysmal enlargement of his aortic arch and descending thoracic aorta with a maximum diameter at 5.8 cm in the proximal descending thoracic aorta. This is unchanged from his scan last year. Given his advanced age and comorbidities I think it is best to continue following this unless there is some significant further enlargement.  Plan:  I will plan to see him back in one year with a CT angiogram of the chest.

## 2012-09-09 ENCOUNTER — Encounter: Payer: Self-pay | Admitting: Orthopedic Surgery

## 2012-10-25 ENCOUNTER — Other Ambulatory Visit (HOSPITAL_COMMUNITY): Payer: Self-pay | Admitting: Cardiovascular Disease

## 2012-10-25 NOTE — Telephone Encounter (Signed)
Done today by nya.

## 2012-10-25 NOTE — Telephone Encounter (Signed)
Pt wife is calling-says pharmacist instructed her to call here for new prescription-he needs his Metoprolol 50mg -please call to [phone]!

## 2012-11-06 ENCOUNTER — Other Ambulatory Visit: Payer: Self-pay | Admitting: Internal Medicine

## 2012-11-08 IMAGING — CT CT ANKLE*L* W/O CM
4 series · 16 of 33 positions shown, 19 images · non-contrast
Comparison: None.

CLINICAL DATA: Worsening ankle arthritis and pain over the past 5
years.  Question avascular necrosis.

CT OF THE LEFT ANKLE WITHOUT CONTRAST
TECHNIQUE: Multidetector CT imaging was performed according to the
standard protocol. Multiplanar CT image reconstructions were also
generated.

[Series 3: lower ext bone · axial · 0.29mm/px · z∈[-56,+39]mm · 5 of 58 slices shown, 7 images]
[im 10/58  soft-tissue]
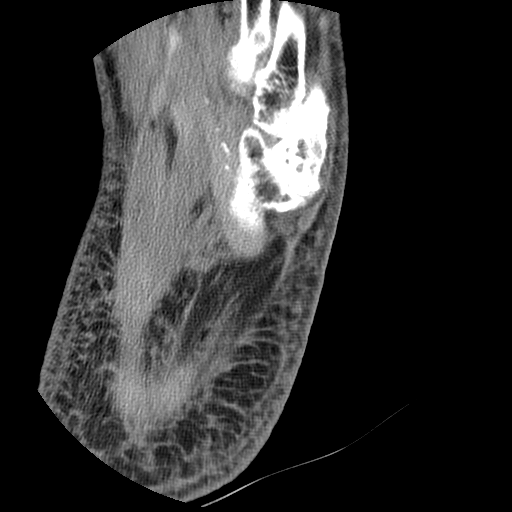
[im 10/58  bone]
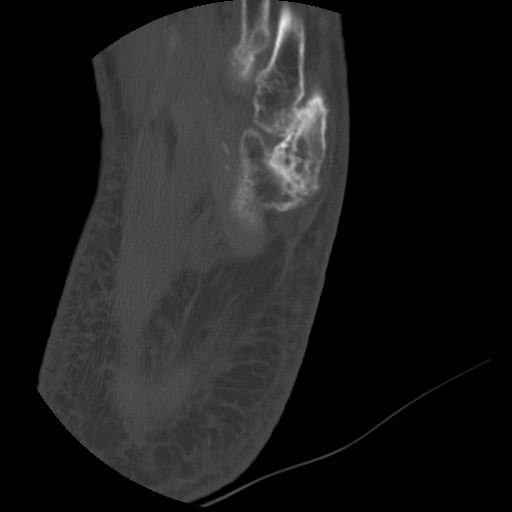
[im 20/58  bone]
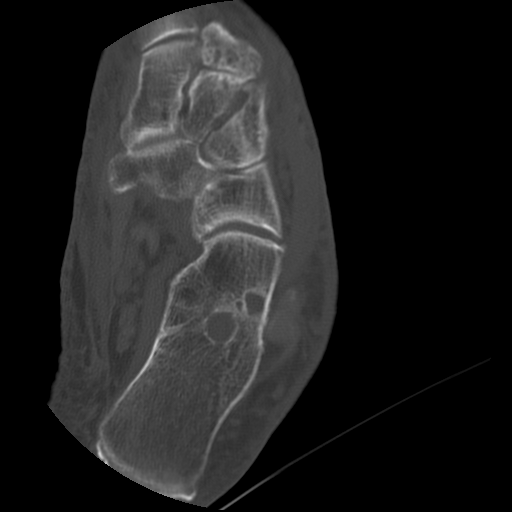
[im 29/58  bone]
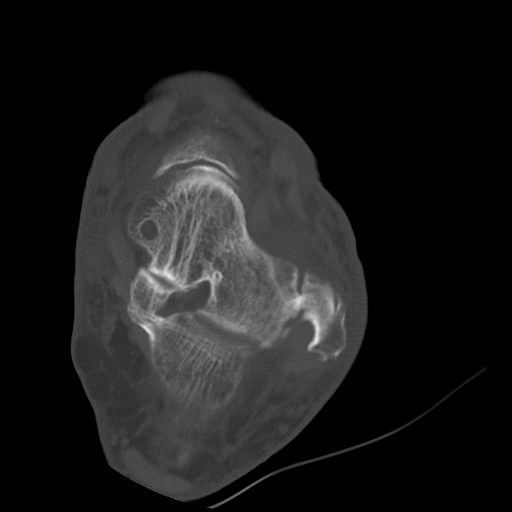
[im 39/58  bone]
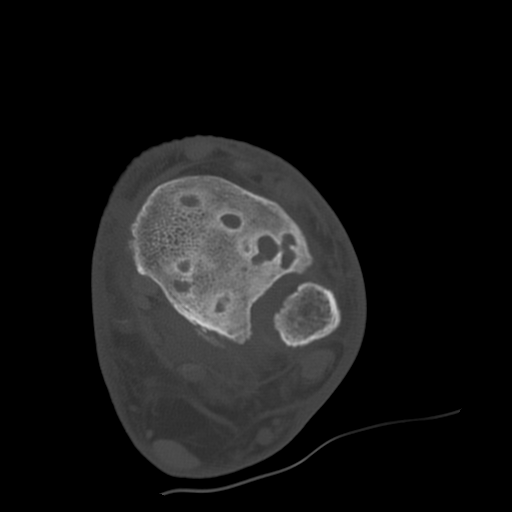
[im 48/58  soft-tissue]
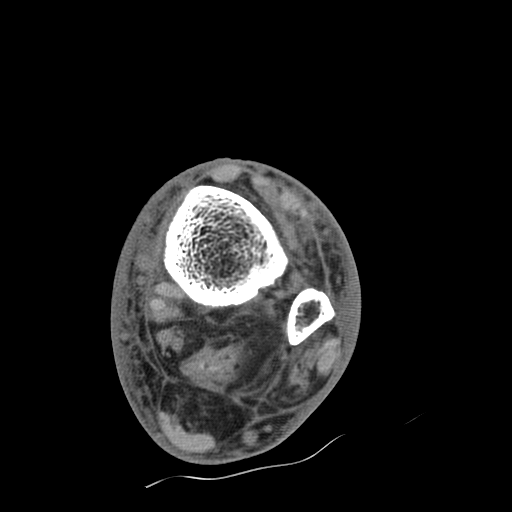
[im 48/58  bone]
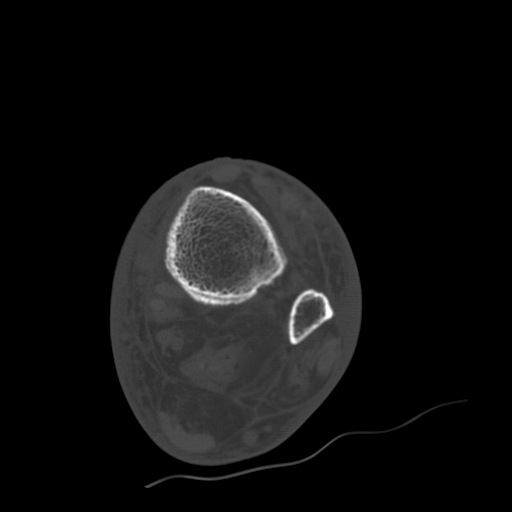

[Series 4: lower ext detail · axial · 0.29mm/px · z∈[-56,-9]mm · 3 of 58 slices shown]
[im 10/58  bone]
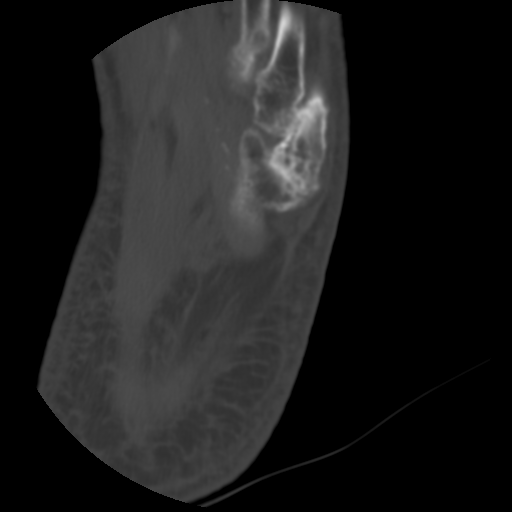
[im 20/58  bone]
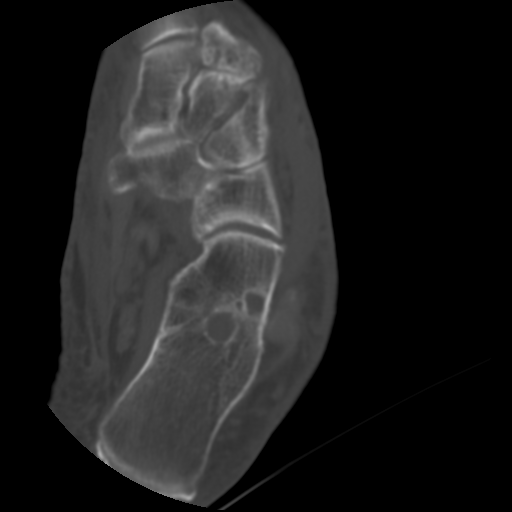
[im 29/58  bone]
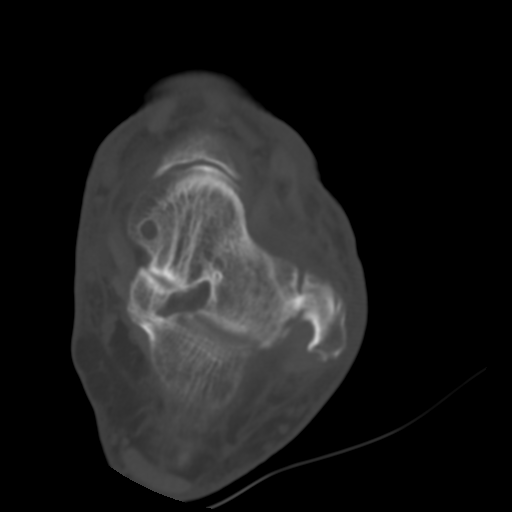

[Series 200: coronal · coronal · 0.29mm/px · 3 of 48 slices shown]
[im 10/48  bone]
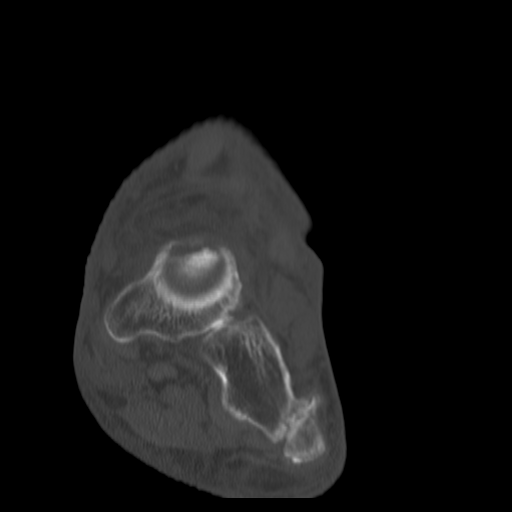
[im 19/48  bone]
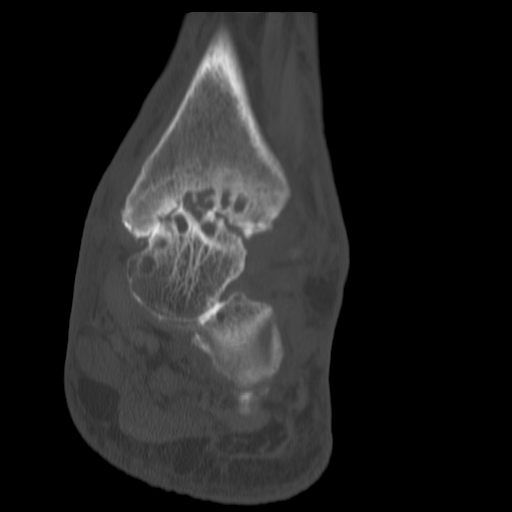
[im 29/48  bone]
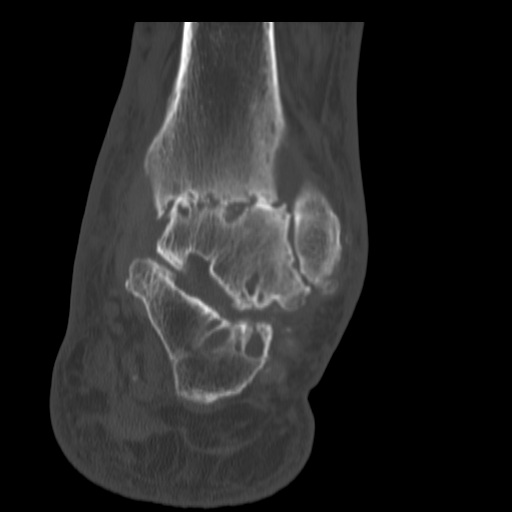

[Series 201: sagittal · coronal · 0.29mm/px · 5 of 42 slices shown, 6 images]
[im 14/42  bone]
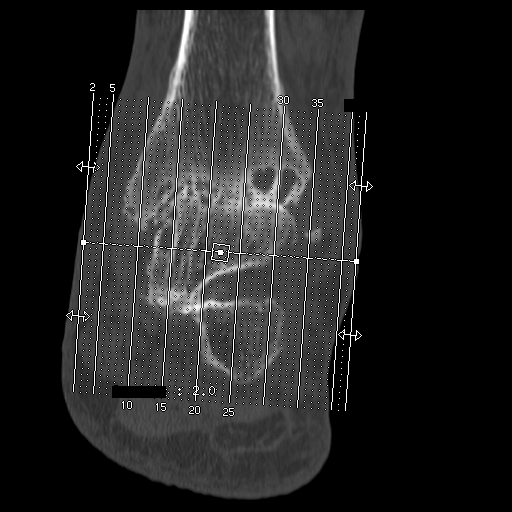
[im 18/42  bone]
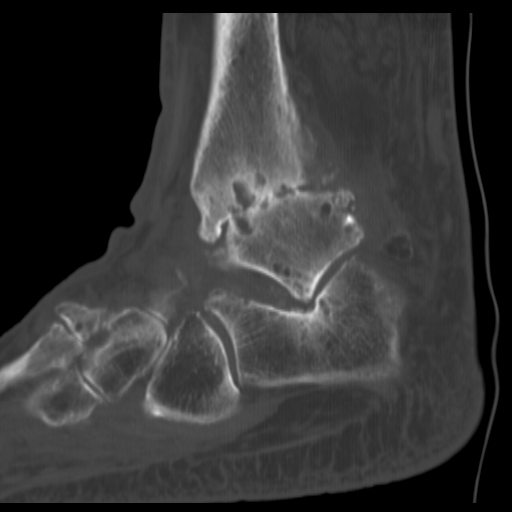
[im 21/42  soft-tissue]
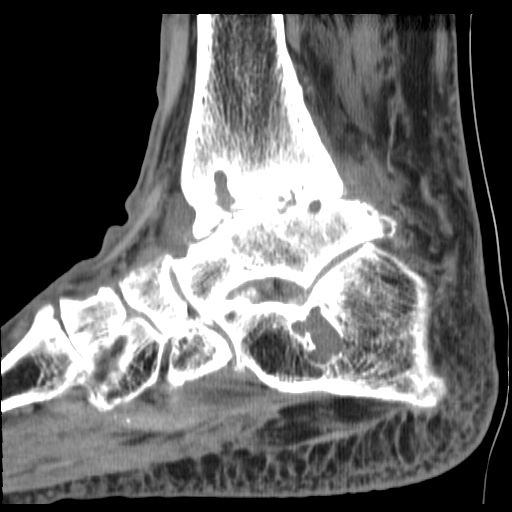
[im 21/42  bone]
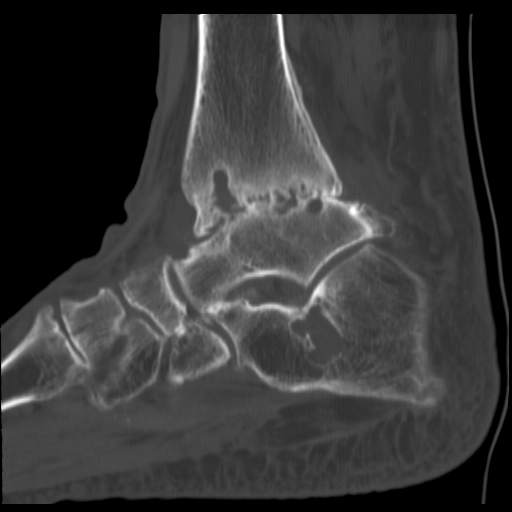
[im 24/42  bone]
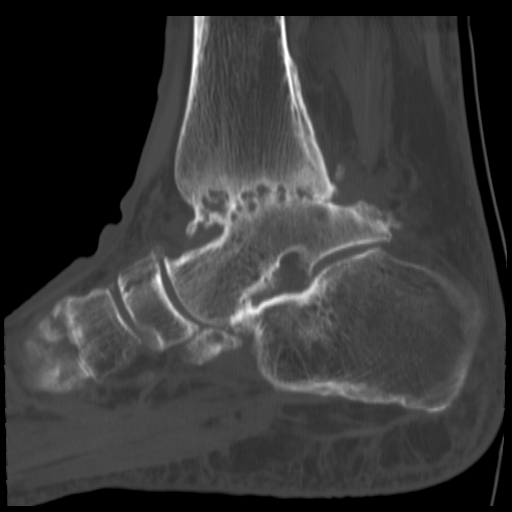
[im 28/42  bone]
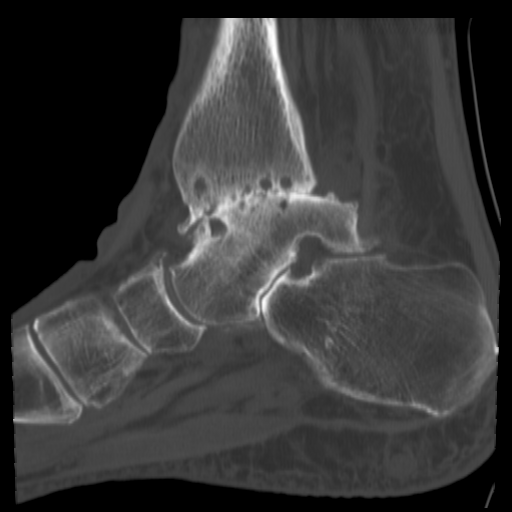

[16 of 33 positions shown; findings below may reference images not displayed]

FINDINGS: No evidence of avascular necrosis is identified.  The
patient has severe tibiotalar degenerative disease.  There is
extensive bridging bone across tibiotalar joint.  There is also
marked joint space narrowing at the middle facet of the posterior
facet of the subtalar joint.  There is fusion of the anterior facet
of the subtalar joint and talus.  Also seen is marked degenerative
change of the second through fifth tarsometatarsal joints.  The
articulation of the cuboid and fifth metatarsal is fused.  Finally,
a small amount of bridging bone is seen between the distal fibula
and talus.

No focal fluid collection or mass is identified.  There is some
calcification of the peroneal tendons compatible chronic
tenosynovitis.  No obvious tendon tear is identified.
IMPRESSION: 1.  Negative for avascular necrosis.
2.  Severe degenerative disease of the hind and mid foot with
osseous fusion across the tibiotalar joint, the anterior facet of
the subtalar joint, distal fibula and talus and articulation of the
cuboid and fifth metatarsal.

## 2012-12-13 ENCOUNTER — Other Ambulatory Visit (HOSPITAL_COMMUNITY): Payer: Self-pay | Admitting: Cardiovascular Disease

## 2012-12-14 ENCOUNTER — Other Ambulatory Visit: Payer: Self-pay | Admitting: *Deleted

## 2012-12-14 NOTE — Telephone Encounter (Signed)
Rx was sent to pharmacy electronically. 

## 2012-12-31 ENCOUNTER — Other Ambulatory Visit: Payer: Self-pay | Admitting: Cardiovascular Disease

## 2012-12-31 NOTE — Telephone Encounter (Signed)
Rx was sent to pharmacy electronically. 

## 2013-01-23 ENCOUNTER — Encounter: Payer: Self-pay | Admitting: *Deleted

## 2013-01-24 ENCOUNTER — Encounter: Payer: Self-pay | Admitting: Cardiovascular Disease

## 2013-01-25 ENCOUNTER — Ambulatory Visit: Payer: Medicare Other | Admitting: Cardiovascular Disease

## 2013-01-26 ENCOUNTER — Ambulatory Visit: Payer: Medicare Other | Admitting: Cardiovascular Disease

## 2013-02-03 ENCOUNTER — Ambulatory Visit (INDEPENDENT_AMBULATORY_CARE_PROVIDER_SITE_OTHER): Payer: Medicare Other | Admitting: Cardiovascular Disease

## 2013-02-03 ENCOUNTER — Encounter: Payer: Self-pay | Admitting: Cardiovascular Disease

## 2013-02-03 VITALS — BP 120/82 | HR 54 | Ht 70.0 in | Wt 226.3 lb

## 2013-02-03 DIAGNOSIS — I7100 Dissection of unspecified site of aorta: Secondary | ICD-10-CM

## 2013-02-03 DIAGNOSIS — I712 Thoracic aortic aneurysm, without rupture, unspecified: Secondary | ICD-10-CM

## 2013-02-03 DIAGNOSIS — I2510 Atherosclerotic heart disease of native coronary artery without angina pectoris: Secondary | ICD-10-CM

## 2013-02-03 DIAGNOSIS — E785 Hyperlipidemia, unspecified: Secondary | ICD-10-CM

## 2013-02-03 DIAGNOSIS — I4891 Unspecified atrial fibrillation: Secondary | ICD-10-CM

## 2013-02-03 DIAGNOSIS — I10 Essential (primary) hypertension: Secondary | ICD-10-CM

## 2013-02-03 MED ORDER — METOLAZONE 2.5 MG PO TABS: 2.5000 mg | ORAL_TABLET | Freq: Every day | ORAL | Status: DC | PRN

## 2013-02-03 MED ORDER — SIMVASTATIN 20 MG PO TABS: 20.0000 mg | ORAL_TABLET | Freq: Every day | ORAL | Status: DC

## 2013-02-03 MED ORDER — ENALAPRIL MALEATE 20 MG PO TABS: 20.0000 mg | ORAL_TABLET | Freq: Two times a day (BID) | ORAL | Status: DC

## 2013-02-03 MED ORDER — POTASSIUM CHLORIDE ER 10 MEQ PO TBCR: 20.0000 meq | EXTENDED_RELEASE_TABLET | Freq: Every day | ORAL | Status: DC

## 2013-02-03 MED ORDER — DIGOXIN 125 MCG PO TABS: 0.1250 mg | ORAL_TABLET | Freq: Every day | ORAL | Status: DC

## 2013-02-03 NOTE — Patient Instructions (Addendum)
Your physician recommends that you schedule a follow-up appointment in: 6 MONTHS. No changes were made today. 

## 2013-02-05 ENCOUNTER — Encounter: Payer: Self-pay | Admitting: Cardiovascular Disease

## 2013-02-05 NOTE — Progress Notes (Signed)
Patient ID: Lee Gutierrez, male   DOB: June 29, 1930, 79 y.o.   MRN: [id number]     HPI: Lee Gutierrez, is a 79 y.o. male who presents for six-month cardiology evaluation.  Mrs. 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 Lavinia Gutierrez. In February 2011 he underwent CABG surgery for severe coronary artery disease after catheterization by me revealed high grade left main stenosis. The LIMA was placed to the LAD, vein to the marginal, vein to the second diagonal. A CT scan in April 2013 showed a 5 x 4.9 proximal descending thoracic aneurysm in the descending thoracic aneurysm measured 5.8 x 5.1 and at the aortic arch 5.6 x 5.5 and the distal thoracic aorta at 3.9 x 4.1. In December 2013 he underwent left ankle surgery and tolerated this well from a cardiovascular standpoint. Since I last saw him 6 months ago, unfortunately his wife was diagnosed with glioblastoma he denies recent chest pain. At times he does note increasing leg swelling and when he does if his weight is up by 3 pounds he takes a when necessary dose of Zaroxolyn. He denies palpitations. He denies abdominal pain. He presents for evaluation.  Past Medical History  Diagnosis Date  . Hypertension   . Arthritis   . Coronary artery disease   . 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 5 x 4.9, proximal descending thoracic aortic aorta measuring 5.8 x 5.1, the aortic 5.6 x 5.5 and distal thoracic aorta 3.9 x 4.1, His infrarenal aneurysm measured 3 x 2.7.  . Shortness of breath   . Aortic dissection   . Dysrhythmia     atrial fib  . Blood transfusion   . CHF, acute on chronic 03/24/2011  . HOH (hard of hearing)     bilateral hearing aids  . Hx of echocardiogram 12/2011    EF 40% showed severe LA dilation, He had moderate concentric LHV. He has moderate inferior wall hypokinesis. There was evidence for pulmonary hypertension with an  estimated RV systolic pressure at 62. He had moderate TR  . History of stress test 12/2011    Showed mild inferior thinning felt most likely due to attenuation artifact.    Past Surgical History  Procedure Laterality Date  . Replacement total knee      bilaterally  . Joint replacement      Bilateral knee  . Tonsillectomy    . Hernia repair    . Cardiac catheterization    . Coronary artery bypass graft  Feb 2011  . Ankle fusion  04/22/2012    Procedure: ARTHRODESIS ANKLE;  Surgeon: Lee Arthurs, MD;  Location: [facility] OR;  Service: Orthopedics;  Laterality: Left;  Left Ankle and Subtalar Arthrodesis     No Known Allergies  Current Outpatient Prescriptions  Medication Sig Dispense Refill  . amLODipine (NORVASC) 5 MG tablet TAKE 1 TABLET DAILY.  90 tablet  2  . amoxicillin (AMOXIL) 500 MG capsule Take 2,000 mg by mouth as needed. Prior to dental appointment      . aspirin 325 MG tablet Take 325 mg by mouth at bedtime.       . Cholecalciferol (VITAMIN D) 2000 UNITS tablet Take 2,000 Units by mouth daily.      . digoxin (LANOXIN) 0.125 MG tablet Take 1 tablet (0.125 mg total) by mouth daily.  90 tablet  3  . diphenhydramine-acetaminophen (TYLENOL PM) 25-500 MG TABS Take 2 tablets  by mouth at bedtime as needed. For sleep or pain      . enalapril (VASOTEC) 20 MG tablet Take 1 tablet (20 mg total) by mouth 2 (two) times daily.  180 tablet  3  . furosemide (LASIX) 40 MG tablet Take 2 tablets (80 mg total) by mouth daily.  180 tablet  1  . metolazone (ZAROXOLYN) 2.5 MG tablet Take 1 tablet (2.5 mg total) by mouth daily as needed. 1/2 hour before furosemide for excessive swelling in legs  30 tablet  6  . metoprolol (LOPRESSOR) 50 MG tablet TAKE 1 TABLET TWICE DAILY.  180 tablet  3  . Multiple Vitamin (MULTIVITAMIN WITH MINERALS) TABS Take 1 tablet by mouth daily. Centrum Silver      . potassium chloride (KLOR-CON 10) 10 MEQ tablet Take 2 tablets (20 mEq total) by mouth daily.  180 tablet  3  .  simvastatin (ZOCOR) 20 MG tablet Take 1 tablet (20 mg total) by mouth at bedtime.  90 tablet  3  . tamsulosin (FLOMAX) 0.4 MG CAPS TAKE ONE CAPSULE BY MOUTH DAILY AFTER MEAL  90 capsule  0  . traMADol (ULTRAM) 50 MG tablet        No current facility-administered medications for this visit.    History   Social History  . Marital Status: Married    Spouse Name: N/A    Number of Children: 3  . Years of Education: N/A   Occupational History  . retired Holiday representative    Social History Main Topics  . Smoking status: Former Smoker    Quit date: 02/04/1963  . Smokeless tobacco: Never Used  . Alcohol Use: No  . Drug Use: No  . Sexual Activity: Not Currently   Other Topics Concern  . Not on file   Social History Narrative   No living will. Wife to make decisions, then Lee Gutierrez, daughter.  Would want resuscitation attempts but no prolonged artificial ventilation. Not sure about tube feedings    Family History  Problem Relation Age of Onset  . Hypertension Father   . COPD Brother   . Diabetes Neg Hx   . Cancer Neg Hx    Socially he is married and has 3 children 7 grandchildren. He is originally from the [place] area. He now lives in [place]. There is no tobacco or alcohol use.  ROS is negative for fevers, chills or night sweats.  He denies palpitations. He denies presyncope or syncope. There is no wheezing per he denies recurrent angina. There is mild shortness of breath intermittently. He denies bleeding. He does note intermittent leg swelling. His mobility is improved following his ankle surgery. He denies tick changes in bowel or bladder habits Other system review is negative.  PE BP 120/82  Pulse 54  Ht 5\' 10"  (1.778 m)  Wt 226 lb 4.8 oz (102.649 kg)  BMI 32.47 kg/m2  General: Alert, oriented, no distress.  Skin: normal turgor, no rashes HEENT: Normocephalic, atraumatic. Pupils round and reactive; sclera anicteric;no lid lag.  Nose without nasal septal  hypertrophy Mouth/Parynx benign; Mallinpatti scal 3 Neck: No JVD, no carotid briuts Lungs: clear to ausculatation and percussion; no wheezing or rales Heart: Irregularly irregular with a controlled ventricular response approximately 50-60, s1 s2 normal 1/6 systolic murmur Abdomen: Moderately large diastases recti; soft, nontender; no hepatosplenomehaly, BS+; abdominal aorta nontender and not dilated by palpation. Pulses 2+ Extremities: Trivial ankle edema today no clubbing cyanosis, Homan's sign negative  Neurologic: grossly nonfocal  ECG: Atrial fibrillation  with a controlled ventricular response in the mid to upper 50s  LABS:  BMET    Component Value Date/Time   NA 137 04/12/2012 0922   K 3.7 04/12/2012 0922   CL 97 04/12/2012 0922   CO2 32 04/12/2012 0922   GLUCOSE 90 04/12/2012 0922   BUN 27* 04/12/2012 0922   CREATININE 1.01 04/12/2012 0922   CALCIUM 9.9 04/12/2012 0922   GFRNONAA 69* 04/12/2012 0922   GFRAA 79* 04/12/2012 0922     Hepatic Function Panel     Component Value Date/Time   PROT 6.5 09/17/2011 0846   ALBUMIN 3.8 09/17/2011 0846   AST 27 09/17/2011 0846   ALT 21 09/17/2011 0846   ALKPHOS 75 09/17/2011 0846   BILITOT 0.7 09/17/2011 0846   BILIDIR 0.1 09/17/2011 0846     CBC    Component Value Date/Time   WBC 5.0 04/12/2012 0922   RBC 3.60* 04/12/2012 0922   HGB 11.9* 04/12/2012 0922   HCT 35.2* 04/12/2012 0922   PLT 176 04/12/2012 0922   MCV 97.8 04/12/2012 0922   MCH 33.1 04/12/2012 0922   MCHC 33.8 04/12/2012 0922   RDW 14.0 04/12/2012 0922   LYMPHSABS 0.9 09/17/2011 0846   MONOABS 0.6 09/17/2011 0846   EOSABS 0.3 09/17/2011 0846   BASOSABS 0.0 09/17/2011 0846     BNP    Component Value Date/Time   PROBNP 1273.0* 04/01/2011 0949    Lipid Panel     Component Value Date/Time   CHOL 123 09/17/2011 0846   TRIG 37.0 09/17/2011 0846   HDL 65.00 09/17/2011 0846   CHOLHDL 2 09/17/2011 0846   VLDL 7.4 09/17/2011 0846   LDLCALC 51 09/17/2011 0846     RADIOLOGY: No results  found.    ASSESSMENT AND PLAN:  Mrs. Phylliss Bob is now 79 years old. He has long-standing type III aortic dissection which is continuously follow by periodic CT imaging, originally while he lived in [place] and more recently followed by Dr. Lavinia Gutierrez. He is now 3 and half years status post CBG surgery. His last nuclear perfusion study was in August 2013 which show fairly normal perfusion. Presently he is doing well without angina. He takes metolazone on a when necessary basis for increasing edema. All tried to obtain results of recent laboratory. As long as he remains stable I will see him in 6 months for cardiology followup evaluation.   Lennette Bihari, MD, [facility]  02/05/2013 7:36 PM

## 2013-02-14 ENCOUNTER — Other Ambulatory Visit: Payer: Self-pay | Admitting: Internal Medicine

## 2013-02-14 NOTE — Telephone Encounter (Signed)
Okay to refill #90 x 0 Have him set up an appt sometime---- currently stressed with wife dying of cancer so I don't want to push it

## 2013-02-14 NOTE — Telephone Encounter (Signed)
rx called into pharmacy Pt scheduled appt in Nov

## 2013-02-14 NOTE — Telephone Encounter (Signed)
Last seen 01/2012 and last filled

## 2013-03-01 ENCOUNTER — Other Ambulatory Visit: Payer: Self-pay | Admitting: Internal Medicine

## 2013-03-14 ENCOUNTER — Encounter: Payer: Self-pay | Admitting: Radiology

## 2013-03-15 ENCOUNTER — Ambulatory Visit (INDEPENDENT_AMBULATORY_CARE_PROVIDER_SITE_OTHER): Payer: Medicare Other | Admitting: Internal Medicine

## 2013-03-15 ENCOUNTER — Encounter: Payer: Self-pay | Admitting: Internal Medicine

## 2013-03-15 VITALS — BP 122/70 | HR 73 | Ht 70.0 in | Wt 234.0 lb

## 2013-03-15 DIAGNOSIS — I272 Pulmonary hypertension, unspecified: Secondary | ICD-10-CM

## 2013-03-15 DIAGNOSIS — E785 Hyperlipidemia, unspecified: Secondary | ICD-10-CM

## 2013-03-15 DIAGNOSIS — I4891 Unspecified atrial fibrillation: Secondary | ICD-10-CM

## 2013-03-15 DIAGNOSIS — I5032 Chronic diastolic (congestive) heart failure: Secondary | ICD-10-CM

## 2013-03-15 DIAGNOSIS — I2789 Other specified pulmonary heart diseases: Secondary | ICD-10-CM

## 2013-03-15 NOTE — Assessment & Plan Note (Signed)
Good rate control ASA only due to aortic dissection On dig

## 2013-03-15 NOTE — Assessment & Plan Note (Signed)
Stable exercise tolerance and no significant edema

## 2013-03-15 NOTE — Patient Instructions (Signed)
Please stop the diphenhydramine--this can cause side effects You can try over the counter melatonin-- 3mg  or even 6mg  at bedtime---to see if that helps you sleep

## 2013-03-15 NOTE — Assessment & Plan Note (Signed)
Weight up slightly but doesn't seem to be fluid based Doing exercise at [facility] cardiac rehab twice a week

## 2013-03-15 NOTE — Assessment & Plan Note (Signed)
No problems with the med Due for labs 

## 2013-03-15 NOTE — Progress Notes (Signed)
Subjective:    Patient ID: Lee Gutierrez, male    DOB: [birth year], 79 y.o.   MRN: [id number]  HPI Just lost his wife to an aggressive glioblastoma Has been coping Daughter is local-- [name] Norwick Still goes to daily mass at church--has support group there  Ankle fusion almost a year ago Did rehab and is doing better now Able to walk with just a cane Still with arthritic pain ---uses acetaminophen Uses tramadol at night mostly Discussed not using the diphenhydramine  No chest pain No SOB Recent evaluations by cardiologist and C-T surgeon No action needed on aneurysm No palpitations  No trouble with statin No myalgia or GI side effects  Current Outpatient Prescriptions on File Prior to Visit  Medication Sig Dispense Refill  . amLODipine (NORVASC) 5 MG tablet TAKE 1 TABLET DAILY.  90 tablet  2  . amoxicillin (AMOXIL) 500 MG capsule Take 2,000 mg by mouth as needed. Prior to dental appointment      . aspirin 325 MG tablet Take 325 mg by mouth at bedtime.       . Cholecalciferol (VITAMIN D) 2000 UNITS tablet Take 2,000 Units by mouth daily.      . digoxin (LANOXIN) 0.125 MG tablet Take 1 tablet (0.125 mg total) by mouth daily.  90 tablet  3  . enalapril (VASOTEC) 20 MG tablet Take 1 tablet (20 mg total) by mouth 2 (two) times daily.  180 tablet  3  . furosemide (LASIX) 40 MG tablet Take 2 tablets (80 mg total) by mouth daily.  180 tablet  1  . metolazone (ZAROXOLYN) 2.5 MG tablet Take 1 tablet (2.5 mg total) by mouth daily as needed. 1/2 hour before furosemide for excessive swelling in legs  30 tablet  6  . metoprolol (LOPRESSOR) 50 MG tablet TAKE 1 TABLET TWICE DAILY.  180 tablet  3  . Multiple Vitamin (MULTIVITAMIN WITH MINERALS) TABS Take 1 tablet by mouth daily. Centrum Silver      . simvastatin (ZOCOR) 20 MG tablet Take 1 tablet (20 mg total) by mouth at bedtime.  90 tablet  3  . tamsulosin (FLOMAX) 0.4 MG CAPS capsule Take 1 capsule (0.4 mg total) by mouth daily.  90 capsule   3  . traMADol (ULTRAM) 50 MG tablet TAKE 1 TABLET BY MOUTH 3 TIMES A DAY AS NEEDED FOR PAIN. MAX 8 TABLETS PER DAY  90 tablet  0   No current facility-administered medications on file prior to visit.    No Known Allergies  Past Medical History  Diagnosis Date  . Hypertension   . Arthritis   . Coronary artery disease   . 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 5 x 4.9, proximal descending thoracic aortic aorta measuring 5.8 x 5.1, the aortic 5.6 x 5.5 and distal thoracic aorta 3.9 x 4.1, His infrarenal aneurysm measured 3 x 2.7.  . Shortness of breath   . Aortic dissection   . Dysrhythmia     atrial fib  . Blood transfusion   . CHF, acute on chronic 03/24/2011  . HOH (hard of hearing)     bilateral hearing aids  . Hx of echocardiogram 12/2011    EF 40% showed severe LA dilation, He had moderate concentric LHV. He has moderate inferior wall hypokinesis. There was evidence for pulmonary hypertension with an estimated RV systolic pressure at 62. He had moderate TR  . History of stress test 12/2011    Showed mild inferior thinning felt  most likely due to attenuation artifact.    Past Surgical History  Procedure Laterality Date  . Replacement total knee      bilaterally  . Joint replacement      Bilateral knee  . Tonsillectomy    . Hernia repair    . Cardiac catheterization    . Coronary artery bypass graft  Feb 2011  . Ankle fusion  04/22/2012    Procedure: ARTHRODESIS ANKLE;  Surgeon: Toni Arthurs, MD;  Location: [facility] OR;  Service: Orthopedics;  Laterality: Left;  Left Ankle and Subtalar Arthrodesis     Family History  Problem Relation Age of Onset  . Hypertension Father   . COPD Brother   . Diabetes Neg Hx   . Cancer Neg Hx     History   Social History  . Marital Status: Widowed    Spouse Name: N/A    Number of Children: 3  . Years of Education: N/A   Occupational History  . retired Holiday representative    Social History Main Topics  .  Smoking status: Former Smoker    Quit date: 02/04/1963  . Smokeless tobacco: Never Used  . Alcohol Use: No  . Drug Use: No  . Sexual Activity: Not Currently   Other Topics Concern  . Not on file   Social History Narrative   No living will. Wife to make decisions, then [name], daughter.  Would want resuscitation attempts but no prolonged artificial ventilation. Not sure about tube feedings     Review of Systems Still having sleep problems Appetite is okay Daughter makes him extra food    Objective:   Physical Exam  Constitutional: He appears well-developed and well-nourished. No distress.  Neck: Normal range of motion. Neck supple. No thyromegaly present.  Cardiovascular: Normal rate and normal heart sounds.  Exam reveals no gallop.   No murmur heard. Irregular Faint distal pulses  Pulmonary/Chest: Effort normal and breath sounds normal. No respiratory distress. He has no wheezes. He has no rales.  Musculoskeletal: He exhibits no edema and no tenderness.  Lymphadenopathy:    He has no cervical adenopathy.  Psychiatric: He has a normal mood and affect. His behavior is normal.          Assessment & Plan:

## 2013-03-17 ENCOUNTER — Encounter: Payer: Self-pay | Admitting: Radiology

## 2013-03-21 ENCOUNTER — Other Ambulatory Visit (INDEPENDENT_AMBULATORY_CARE_PROVIDER_SITE_OTHER): Payer: Medicare Other

## 2013-03-21 LAB — BASIC METABOLIC PANEL
GFR: 103.21 mL/min (ref >60.00)
Potassium: 4 mEq/L (ref 3.5–5.1)
Sodium: 136 mEq/L (ref 135–145)

## 2013-03-21 LAB — LIPID PANEL
LDL Cholesterol: 43 mg/dL (ref 0–99)
VLDL: 10 mg/dL (ref 0.0–40.0)

## 2013-03-21 LAB — CBC WITH DIFFERENTIAL/PLATELET
Basophils Relative: 0.6 % (ref 0.0–3.0)
Eosinophils Relative: 2.5 % (ref 0.0–5.0)
HCT: 35.4 % — ABNORMAL LOW (ref 39.0–52.0)
Hemoglobin: 11.8 g/dL — ABNORMAL LOW (ref 13.0–17.0)
Lymphs Abs: 0.8 10*3/uL (ref 0.7–4.0)
Monocytes Relative: 11.1 % (ref 3.0–12.0)
Neutro Abs: 4.1 10*3/uL (ref 1.4–7.7)
RBC: 3.6 Mil/uL — ABNORMAL LOW (ref 4.22–5.81)
WBC: 5.7 10*3/uL (ref 4.5–10.5)

## 2013-03-21 LAB — TSH: TSH: 0.71 u[IU]/mL (ref 0.35–5.50)

## 2013-03-21 LAB — HEPATIC FUNCTION PANEL
ALT: 19 U/L (ref 0–53)
AST: 24 U/L (ref 0–37)
Albumin: 3.7 g/dL (ref 3.5–5.2)
Alkaline Phosphatase: 64 U/L (ref 39–117)
Total Protein: 6.3 g/dL (ref 6.0–8.3)

## 2013-03-23 ENCOUNTER — Encounter: Payer: Self-pay | Admitting: *Deleted

## 2013-03-29 ENCOUNTER — Other Ambulatory Visit: Payer: Self-pay | Admitting: Cardiovascular Disease

## 2013-03-29 NOTE — Telephone Encounter (Signed)
Rx was sent to pharmacy electronically. 

## 2013-05-09 ENCOUNTER — Encounter: Payer: Self-pay | Admitting: Internal Medicine

## 2013-05-09 ENCOUNTER — Ambulatory Visit (INDEPENDENT_AMBULATORY_CARE_PROVIDER_SITE_OTHER): Payer: Medicare Other | Admitting: Internal Medicine

## 2013-05-09 VITALS — BP 128/70 | HR 66 | Temp 97.7°F | Wt 234.0 lb

## 2013-05-09 DIAGNOSIS — R159 Full incontinence of feces: Secondary | ICD-10-CM

## 2013-05-09 MED ORDER — CHOLESTYRAMINE LIGHT 4 G PO PACK: 4.0000 g | PACK | Freq: Two times a day (BID) | ORAL | Status: DC

## 2013-05-09 NOTE — Progress Notes (Signed)
Pre-visit discussion using our clinic review tool. No additional management support is needed unless otherwise documented below in the visit note.  

## 2013-05-09 NOTE — Progress Notes (Signed)
Subjective:    Patient ID: Lee Gutierrez, male    DOB: 03-16-1931, 79 y.o.   MRN: [id number]  HPI Here with daughter Lee Gutierrez  Having trouble with soft and loose bowels Trouble controlling them Will get urgency and then some incontinence Has been mild in the past but now worse in past 3 weeks  No new meds Eating about the same Has tried having rice--not helping  No fever No blood in stool Usually once a day--occasionally twice. Variable time Usually a couple of hours after eating  Tried imodium-- 2 tabs after episode and then 2 more  As much as 4-5 per day  Current Outpatient Prescriptions on File Prior to Visit  Medication Sig Dispense Refill  . amLODipine (NORVASC) 5 MG tablet TAKE 1 TABLET DAILY.  90 tablet  2  . amoxicillin (AMOXIL) 500 MG capsule Take 2,000 mg by mouth as needed. Prior to dental appointment      . aspirin 325 MG tablet Take 325 mg by mouth at bedtime.       . Cholecalciferol (VITAMIN D) 2000 UNITS tablet Take 2,000 Units by mouth daily.      . digoxin (LANOXIN) 0.125 MG tablet Take 1 tablet (0.125 mg total) by mouth daily.  90 tablet  3  . enalapril (VASOTEC) 20 MG tablet TAKE 1 TABLET TWICE A DAY (THIS REPLACES THE AMLODIPINE)  180 tablet  3  . furosemide (LASIX) 40 MG tablet Take 2 tablets (80 mg total) by mouth daily.  180 tablet  1  . KLOR-CON M10 10 MEQ tablet Take 20 mEq by mouth daily.       . metolazone (ZAROXOLYN) 2.5 MG tablet Take 1 tablet (2.5 mg total) by mouth daily as needed. 1/2 hour before furosemide for excessive swelling in legs  30 tablet  6  . metoprolol (LOPRESSOR) 50 MG tablet TAKE 1 TABLET TWICE DAILY.  180 tablet  3  . Multiple Vitamin (MULTIVITAMIN WITH MINERALS) TABS Take 1 tablet by mouth daily. Centrum Silver      . simvastatin (ZOCOR) 20 MG tablet Take 1 tablet (20 mg total) by mouth at bedtime.  90 tablet  3  . tamsulosin (FLOMAX) 0.4 MG CAPS capsule Take 1 capsule (0.4 mg total) by mouth daily.  90 capsule  3  . traMADol  (ULTRAM) 50 MG tablet TAKE 1 TABLET BY MOUTH 3 TIMES A DAY AS NEEDED FOR PAIN. MAX 8 TABLETS PER DAY  90 tablet  0   No current facility-administered medications on file prior to visit.    No Known Allergies  Past Medical History  Diagnosis Date  . Hypertension   . Arthritis   . Coronary artery disease   . 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 5 x 4.9, proximal descending thoracic aortic aorta measuring 5.8 x 5.1, the aortic 5.6 x 5.5 and distal thoracic aorta 3.9 x 4.1, His infrarenal aneurysm measured 3 x 2.7.  . Shortness of breath   . Aortic dissection   . Dysrhythmia     atrial fib  . Blood transfusion   . CHF, acute on chronic 03/24/2011  . HOH (hard of hearing)     bilateral hearing aids  . Hx of echocardiogram 12/2011    EF 40% showed severe LA dilation, He had moderate concentric LHV. He has moderate inferior wall hypokinesis. There was evidence for pulmonary hypertension with an estimated RV systolic pressure at 62. He had moderate TR  . History of stress  test 12/2011    Showed mild inferior thinning felt most likely due to attenuation artifact.    Past Surgical History  Procedure Laterality Date  . Replacement total knee      bilaterally  . Joint replacement      Bilateral knee  . Tonsillectomy    . Hernia repair    . Cardiac catheterization    . Coronary artery bypass graft  Feb 2011  . Ankle fusion  04/22/2012    Procedure: ARTHRODESIS ANKLE;  Surgeon: Toni Arthurs, MD;  Location: [facility] OR;  Service: Orthopedics;  Laterality: Left;  Left Ankle and Subtalar Arthrodesis     Family History  Problem Relation Age of Onset  . Hypertension Father   . COPD Brother   . Diabetes Neg Hx   . Cancer Neg Hx     History   Social History  . Marital Status: Widowed    Spouse Name: N/A    Number of Children: 3  . Years of Education: N/A   Occupational History  . retired Holiday representative    Social History Main Topics  . Smoking status:  Former Smoker    Quit date: 02/04/1963  . Smokeless tobacco: Never Used  . Alcohol Use: No  . Drug Use: No  . Sexual Activity: Not Currently   Other Topics Concern  . Not on file   Social History Narrative   No living will. Wife to make decisions, then Lee Gutierrez, daughter.  Would want resuscitation attempts but no prolonged artificial ventilation. Not sure about tube feedings   Review of Systems Appetite is fine Weight is stable    Objective:   Physical Exam  Constitutional: He appears well-developed and well-nourished. No distress.  Abdominal: Soft. Bowel sounds are normal. He exhibits no distension. There is no tenderness. There is no rebound and no guarding.          Assessment & Plan:

## 2013-05-09 NOTE — Patient Instructions (Addendum)
Please sit on the toilet after meals until you have gone (like 3-5 minutes). Try the new medication as well as increased fiber.  High-Fiber Diet Fiber is found in fruits, vegetables, and grains. A high-fiber diet encourages the addition of more whole grains, legumes, fruits, and vegetables in your diet. The recommended amount of fiber for adult males is 38 g per day. For adult females, it is 25 g per day. Pregnant and lactating women should get 28 g of fiber per day. If you have a digestive or bowel problem, ask your caregiver for advice before adding high-fiber foods to your diet. Eat a variety of high-fiber foods instead of only a select few type of foods.  PURPOSE  To increase stool bulk.  To make bowel movements more regular to prevent constipation.  To lower cholesterol.  To prevent overeating. WHEN IS THIS DIET USED?  It may be used if you have constipation and hemorrhoids.  It may be used if you have uncomplicated diverticulosis (intestine condition) and irritable bowel syndrome.  It may be used if you need help with weight management.  It may be used if you want to add it to your diet as a protective measure against atherosclerosis, diabetes, and cancer. SOURCES OF FIBER  Whole-grain breads and cereals.  Fruits, such as apples, oranges, bananas, berries, prunes, and pears.  Vegetables, such as green peas, carrots, sweet potatoes, beets, broccoli, cabbage, spinach, and artichokes.  Legumes, such split peas, soy, lentils.  Almonds. FIBER CONTENT IN FOODS Starches and Grains / Dietary Fiber (g)  Cheerios, 1 cup / 3 g  Corn Flakes cereal, 1 cup / 0.7 g  Rice crispy treat cereal, 1 cup / 0.3 g  Instant oatmeal (cooked),  cup / 2 g  Frosted wheat cereal, 1 cup / 5.1 g  Brown, long-grain rice (cooked), 1 cup / 3.5 g  White, long-grain rice (cooked), 1 cup / 0.6 g  Enriched macaroni (cooked), 1 cup / 2.5 g Legumes / Dietary Fiber (g)  Baked beans (canned,  plain, or vegetarian),  cup / 5.2 g  Kidney beans (canned),  cup / 6.8 g  Pinto beans (cooked),  cup / 5.5 g Breads and Crackers / Dietary Fiber (g)  Plain or honey graham crackers, 2 squares / 0.7 g  Saltine crackers, 3 squares / 0.3 g  Plain, salted pretzels, 10 pieces / 1.8 g  Whole-wheat bread, 1 slice / 1.9 g  White bread, 1 slice / 0.7 g  Raisin bread, 1 slice / 1.2 g  Plain bagel, 3 oz / 2 g  Flour tortilla, 1 oz / 0.9 g  Corn tortilla, 1 small / 1.5 g  Hamburger or hotdog bun, 1 small / 0.9 g Fruits / Dietary Fiber (g)  Apple with skin, 1 medium / 4.4 g  Sweetened applesauce,  cup / 1.5 g  Banana,  medium / 1.5 g  Grapes, 10 grapes / 0.4 g  Orange, 1 small / 2.3 g  Raisin, 1.5 oz / 1.6 g  Melon, 1 cup / 1.4 g Vegetables / Dietary Fiber (g)  Green beans (canned),  cup / 1.3 g  Carrots (cooked),  cup / 2.3 g  Broccoli (cooked),  cup / 2.8 g  Peas (cooked),  cup / 4.4 g  Mashed potatoes,  cup / 1.6 g  Lettuce, 1 cup / 0.5 g  Corn (canned),  cup / 1.6 g  Tomato,  cup / 1.1 g Document Released: 04/28/2005 Document Revised: 10/28/2011 Document  Reviewed: 07/31/2011 Exit[employer] Patient Information 2014 [place], [place].

## 2013-05-09 NOTE — Assessment & Plan Note (Signed)
Not sick Imodium no help Discussed increasing fiber Will try cholestyramine

## 2013-05-16 ENCOUNTER — Other Ambulatory Visit: Payer: Self-pay | Admitting: Internal Medicine

## 2013-05-16 NOTE — Telephone Encounter (Signed)
Okay #90 x 0 

## 2013-05-16 NOTE — Telephone Encounter (Signed)
Last filled 02/14/13. 

## 2013-05-16 NOTE — Telephone Encounter (Signed)
rx called into pharmacy

## 2013-06-01 ENCOUNTER — Other Ambulatory Visit: Payer: Self-pay | Admitting: Cardiovascular Disease

## 2013-06-15 ENCOUNTER — Other Ambulatory Visit (HOSPITAL_COMMUNITY): Payer: Self-pay | Admitting: Cardiovascular Disease

## 2013-06-15 NOTE — Telephone Encounter (Signed)
Rx was sent to pharmacy electronically. 

## 2013-07-27 ENCOUNTER — Encounter: Payer: Self-pay | Admitting: Cardiovascular Disease

## 2013-07-27 ENCOUNTER — Ambulatory Visit (INDEPENDENT_AMBULATORY_CARE_PROVIDER_SITE_OTHER): Payer: Medicare Other | Admitting: Cardiovascular Disease

## 2013-07-27 VITALS — BP 126/82 | HR 62 | Ht 70.0 in | Wt 228.2 lb

## 2013-07-27 DIAGNOSIS — I712 Thoracic aortic aneurysm, without rupture, unspecified: Secondary | ICD-10-CM

## 2013-07-27 DIAGNOSIS — I7100 Dissection of unspecified site of aorta: Secondary | ICD-10-CM

## 2013-07-27 DIAGNOSIS — I2510 Atherosclerotic heart disease of native coronary artery without angina pectoris: Secondary | ICD-10-CM

## 2013-07-27 DIAGNOSIS — I4891 Unspecified atrial fibrillation: Secondary | ICD-10-CM

## 2013-07-27 DIAGNOSIS — I10 Essential (primary) hypertension: Secondary | ICD-10-CM

## 2013-07-27 DIAGNOSIS — E785 Hyperlipidemia, unspecified: Secondary | ICD-10-CM

## 2013-07-27 NOTE — Patient Instructions (Signed)
Your physician has recommended you make the following change in your medication: decrease the aspirin to 81 mg.  Your physician recommends that you schedule a follow-up appointment in: 6 months.

## 2013-07-28 ENCOUNTER — Other Ambulatory Visit: Payer: Self-pay

## 2013-07-28 DIAGNOSIS — I712 Thoracic aortic aneurysm, without rupture, unspecified: Secondary | ICD-10-CM

## 2013-07-28 DIAGNOSIS — D381 Neoplasm of uncertain behavior of trachea, bronchus and lung: Secondary | ICD-10-CM

## 2013-08-01 ENCOUNTER — Ambulatory Visit: Payer: Medicare Other | Admitting: Cardiovascular Disease

## 2013-08-07 ENCOUNTER — Encounter: Payer: Self-pay | Admitting: Cardiovascular Disease

## 2013-08-07 NOTE — Progress Notes (Signed)
Patient ID: Lee Gutierrez, male   DOB: 20-Sep-1930, 80 y.o.   MRN: [id number]     HPI: Lee Gutierrez is a 80 y.o. male who presents for fourmonth cardiology evaluation.  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 Cyndia Bent. In February 2011 he underwent CABG surgery for severe coronary artery disease after catheterization by me revealed high grade left main stenosis. The LIMA was placed to the LAD, vein to the marginal, vein to the second diagonal. A CT scan in April 2013 showed a 5 x 4.9 proximal descending thoracic aneurysm in the descending thoracic aneurysm measured 5.8 x 5.1 and at the aortic arch 5.6 x 5.5 and the distal thoracic aorta at 3.9 x 4.1. In December 2013 he underwent left ankle surgery and tolerated this well from a cardiovascular standpoint. His wife was diagnosed with glioblastoma last year, and unfortunately since I last saw him she died in 2013/03/03 at age 71. Currently, he is living by himself.   He has history of permanent atrial fibrillation. Because of the aneurysm and type III aortic dissection is only on aspirin rather than full dose anticoagulation.  He denies recent chest pain. He denies significant shortness of breath. He is unaware of arrhythmias. At times he does note increasing leg swelling and when he does if his weight is up by 3 pounds he takes a dose of Zaroxolyn. He denies palpitations. He denies abdominal pain. He presents for evaluation.  Past Medical History  Diagnosis Date  . Hypertension   . Arthritis   . Coronary artery disease   . 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 5 x 4.9, proximal descending thoracic aortic aorta measuring 5.8 x 5.1, the aortic 5.6 x 5.5 and distal thoracic aorta 3.9 x 4.1, His infrarenal aneurysm measured 3 x 2.7.  . Shortness of breath   . Aortic dissection   . Dysrhythmia     atrial fib  . Blood  transfusion   . CHF, acute on chronic 03/24/2011  . HOH (hard of hearing)     bilateral hearing aids  . Hx of echocardiogram 12/2011    EF 40% showed severe LA dilation, He had moderate concentric LHV. He has moderate inferior wall hypokinesis. There was evidence for pulmonary hypertension with an estimated RV systolic pressure at 62. He had moderate TR  . History of stress test 12/2011    Showed mild inferior thinning felt most likely due to attenuation artifact.    Past Surgical History  Procedure Laterality Date  . Replacement total knee      bilaterally  . Joint replacement      Bilateral knee  . Tonsillectomy    . Hernia repair    . Cardiac catheterization    . Coronary artery bypass graft  Feb 2011  . Ankle fusion  04/22/2012    Procedure: ARTHRODESIS ANKLE;  Surgeon: Wylene Simmer, Lee Gutierrez;  Location: [facility];  Service: Orthopedics;  Laterality: Left;  Left Ankle and Subtalar Arthrodesis     No Known Allergies  Current Outpatient Prescriptions  Medication Sig Dispense Refill  . amLODipine (NORVASC) 5 MG tablet TAKE 1 TABLET BY MOUTH EVERY DAY  90 tablet  2  . amoxicillin (AMOXIL) 500 MG capsule Take 2,000 mg by mouth as needed. Prior to dental appointment      . aspirin 81 MG tablet Take 81 mg by mouth daily.      Marland Kitchen  Cholecalciferol (VITAMIN D) 2000 UNITS tablet Take 2,000 Units by mouth daily.      . cholestyramine light (PREVALITE) 4 G packet Take 1 packet (4 g total) by mouth 2 (two) times daily.  60 packet  11  . digoxin (LANOXIN) 0.125 MG tablet Take 1 tablet (0.125 mg total) by mouth daily.  90 tablet  3  . enalapril (VASOTEC) 20 MG tablet TAKE 1 TABLET TWICE A DAY (THIS REPLACES THE AMLODIPINE)  180 tablet  3  . furosemide (LASIX) 40 MG tablet TAKE 2 TABLETS (80 MG TOTAL) BY MOUTH DAILY.  180 tablet  2  . KLOR-CON M10 10 MEQ tablet Take 20 mEq by mouth daily.       . metolazone (ZAROXOLYN) 2.5 MG tablet Take 1 tablet (2.5 mg total) by mouth daily as needed. 1/2 hour before  furosemide for excessive swelling in legs  30 tablet  6  . metoprolol (LOPRESSOR) 50 MG tablet TAKE 1 TABLET TWICE DAILY.  180 tablet  3  . Multiple Vitamin (MULTIVITAMIN WITH MINERALS) TABS Take 1 tablet by mouth daily. Centrum Silver      . simvastatin (ZOCOR) 20 MG tablet Take 1 tablet (20 mg total) by mouth at bedtime.  90 tablet  3  . tamsulosin (FLOMAX) 0.4 MG CAPS capsule Take 1 capsule (0.4 mg total) by mouth daily.  90 capsule  3  . traMADol (ULTRAM) 50 MG tablet TAKE 1 TABLET BY MOUTH 3 TIMES A DAY AS NEEDED PAIN. MAX 8/DAY  90 tablet  0   No current facility-administered medications for this visit.    History   Social History  . Marital Status: Widowed    Spouse Name: N/A    Number of Children: 3  . Years of Education: N/A   Occupational History  . retired Architect    Social History Main Topics  . Smoking status: Former Smoker    Quit date: 02/04/1963  . Smokeless tobacco: Never Used  . Alcohol Use: No  . Drug Use: No  . Sexual Activity: Not Currently   Other Topics Concern  . Not on file   Social History Narrative   No living will. Wife to make decisions, then Lee Gutierrez, daughter.  Would want resuscitation attempts but no prolonged artificial ventilation. Not sure about tube feedings    Family History  Problem Relation Age of Onset  . Hypertension Father   . COPD Brother   . Diabetes Neg Hx   . Cancer Neg Hx    Socially he is recently widowed and has 3 children 7 grandchildren. He is originally from the [place] area. He now lives in [place]. There is no tobacco or alcohol use.  ROS is negative for fevers, chills or night sweats.  He denies rash. He denies change in vision or hearing. He denies palpitations. He denies presyncope or syncope. There is no wheezing. He denies purulent sputum. He denies recurrent angina. There is mild shortness of breath intermittently. There is no abdominal pain no nausea vomiting or diarrhea. He denies bleeding. He does note  intermittent leg swelling. His mobility is improved following his ankle surgery. There is no diabetes. He is unaware of cold or heat intolerance. He denies myalgias on simvastatin. Other comprehensive 14 point system review is negative.  PE BP 126/82  Pulse 62  Ht 5\' 10"  (1.778 m)  Wt 228 lb 3.2 oz (103.511 kg)  BMI 32.74 kg/m2  General: Alert, oriented, no distress.  Skin: normal turgor, no rashes HEENT:  Normocephalic, atraumatic. Pupils round and reactive; sclera anicteric;no lid lag.  Nose without nasal septal hypertrophy; no xanthelasmas Mouth/Parynx benign; Mallinpatti scal 3 Neck: No JVD, no carotid bruits Lungs: clear to ausculatation and percussion; no wheezing or rales Chest wall: Nontender to palpation Heart: Irregularly irregular with a controlled ventricular response approximately 50-60, s1 s2 normal 1/6 systolic murmur; no diastolic murmur Abdomen: Moderately large diastases recti; soft, nontender; no hepatosplenomehaly, BS+; abdominal aorta nontender and not dilated by palpation. Back: No CVA Pulses 2+ Extremities: Trivial ankle edema today no clubbing cyanosis, Homan's sign negative  Neurologic: grossly nonfocal Psychological: He is still grieving his wife's death.  ECG (independently read by me): Atrial fibrillation with a controlled ventricular response in the 60s. Nonspecific ST changes.  Prior ECG: Atrial fibrillation with a controlled ventricular response in the mid to upper 50s  LABS:  BMET    Component Value Date/Time   NA 136 03/21/2013 1009   K 4.0 03/21/2013 1009   CL 101 03/21/2013 1009   CO2 29 03/21/2013 1009   GLUCOSE 69* 03/21/2013 1009   BUN 19 03/21/2013 1009   CREATININE 0.8 03/21/2013 1009   CALCIUM 9.5 03/21/2013 1009   GFRNONAA 69* 04/12/2012 0922   GFRAA 79* 04/12/2012 0922     Hepatic Function Panel     Component Value Date/Time   PROT 6.3 03/21/2013 1009   ALBUMIN 3.7 03/21/2013 1009   AST 24 03/21/2013 1009   ALT 19 03/21/2013  1009   ALKPHOS 64 03/21/2013 1009   BILITOT 1.0 03/21/2013 1009   BILIDIR 0.1 03/21/2013 1009     CBC    Component Value Date/Time   WBC 5.7 03/21/2013 1009   RBC 3.60* 03/21/2013 1009   HGB 11.8* 03/21/2013 1009   HCT 35.4* 03/21/2013 1009   PLT 140.0* 03/21/2013 1009   MCV 98.4 03/21/2013 1009   MCH 33.1 04/12/2012 0922   MCHC 33.4 03/21/2013 1009   RDW 14.8* 03/21/2013 1009   LYMPHSABS 0.8 03/21/2013 1009   MONOABS 0.6 03/21/2013 1009   EOSABS 0.1 03/21/2013 1009   BASOSABS 0.0 03/21/2013 1009     BNP    Component Value Date/Time   PROBNP 1273.0* 04/01/2011 0949    Lipid Panel     Component Value Date/Time   CHOL 113 03/21/2013 1009   TRIG 50.0 03/21/2013 1009   HDL 60.10 03/21/2013 1009   CHOLHDL 2 03/21/2013 1009   VLDL 10.0 03/21/2013 1009   LDLCALC 43 03/21/2013 1009     RADIOLOGY: No results found.    ASSESSMENT AND PLAN:  Mr. Zeiner is an 80 years old WM who has long-standing type III aortic dissection which is continuously follow by periodic CT imaging, originally while he lived in [place] and more recently followed by Dr. Cyndia Bent. He is now 4 years status post CABG surgery. His last nuclear perfusion study  in August 2013  showed fairly normal perfusion. Presently he is doing well without angina. He takes metolazone on a when necessary basis for increasing edema. He has permanent atrial fibrillation his ventricular rate is well controlled with metoprolol and Lanoxin therapy. His blood pressure today is stable at 126/82 on amlodipine 5 mg, enalapril 20 mg in addition to his beta blocker therapy. Laboratory from November continued to show excellent control of his lipids with an LDL cholesterol of 43 on simvastatin 20 mg. I have recommended he reduce his aspirin to 81 mg. I'll see him in 6 months for cardiology reevaluation.  Time spent: 25  minutes  Lee Sine, Lee Gutierrez, [facility]  08/07/2013 9:52 PM

## 2013-08-10 ENCOUNTER — Other Ambulatory Visit: Payer: Self-pay | Admitting: Internal Medicine

## 2013-08-10 NOTE — Telephone Encounter (Signed)
rx called into pharmacy

## 2013-08-10 NOTE — Telephone Encounter (Signed)
Okay #90 x 0 

## 2013-09-05 ENCOUNTER — Other Ambulatory Visit: Payer: Self-pay | Admitting: Surgery

## 2013-09-05 LAB — BUN: BUN: 23 mg/dL (ref 6–23)

## 2013-09-05 LAB — CREATININE, SERUM: CREATININE: 0.77 mg/dL (ref 0.50–1.35)

## 2013-09-07 ENCOUNTER — Ambulatory Visit (INDEPENDENT_AMBULATORY_CARE_PROVIDER_SITE_OTHER): Payer: Medicare Other | Admitting: Surgery

## 2013-09-07 ENCOUNTER — Encounter (INDEPENDENT_AMBULATORY_CARE_PROVIDER_SITE_OTHER): Payer: Self-pay

## 2013-09-07 ENCOUNTER — Ambulatory Visit
Admission: RE | Admit: 2013-09-07 | Discharge: 2013-09-07 | Disposition: A | Discharge status: Home / Self Care | Payer: Medicare Other | Source: Ambulatory Visit | Attending: Surgery | Admitting: Surgery

## 2013-09-07 ENCOUNTER — Encounter: Payer: Self-pay | Admitting: Surgery

## 2013-09-07 VITALS — BP 118/56 | HR 82 | Resp 20 | Ht 70.0 in | Wt 228.0 lb

## 2013-09-07 DIAGNOSIS — I712 Thoracic aortic aneurysm, without rupture, unspecified: Secondary | ICD-10-CM

## 2013-09-07 DIAGNOSIS — D381 Neoplasm of uncertain behavior of trachea, bronchus and lung: Secondary | ICD-10-CM

## 2013-09-07 MED ORDER — IOHEXOL 300 MG/ML  SOLN
80.0000 mL | Freq: Once | INTRAMUSCULAR | Status: AC | PRN
  Administered 2013-09-07: 80 mL via INTRAVENOUS

## 2013-09-09 ENCOUNTER — Encounter: Payer: Self-pay | Admitting: Surgery

## 2013-09-09 NOTE — Progress Notes (Signed)
HPI:  The patient returns to my office today for followup of an ascending, arch, and descending thoracoabdominal aneurysm with a limited, chronic,type B aortic dissection. He underwent CABG by me in 2011. I last saw him on 08/23/2012 at which time a CT angiogram of the chest and abdomen showed stable aneurysmal dilatation of the aorta. Over the past year he denies any chest or back pain. 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 He underwent fusion of his left ankle on 04/22/2012 and is better but still limited by his right ankle. His wife died in the past year from glioblastoma.   Current Outpatient Prescriptions  Medication Sig Dispense Refill  . amLODipine (NORVASC) 5 MG tablet TAKE 1 TABLET BY MOUTH EVERY DAY  90 tablet  2  . amoxicillin (AMOXIL) 500 MG capsule Take 2,000 mg by mouth as needed. Prior to dental appointment      . aspirin 81 MG tablet Take 81 mg by mouth daily.      . Cholecalciferol (VITAMIN D) 2000 UNITS tablet Take 2,000 Units by mouth daily.      . cholestyramine light (PREVALITE) 4 G packet Take 1 packet (4 g total) by mouth 2 (two) times daily.  60 packet  11  . digoxin (LANOXIN) 0.125 MG tablet Take 1 tablet (0.125 mg total) by mouth daily.  90 tablet  3  . enalapril (VASOTEC) 20 MG tablet TAKE 1 TABLET TWICE A DAY (THIS REPLACES THE AMLODIPINE)  180 tablet  3  . furosemide (LASIX) 40 MG tablet TAKE 2 TABLETS (80 MG TOTAL) BY MOUTH DAILY.  180 tablet  2  . KLOR-CON M10 10 MEQ tablet Take 20 mEq by mouth daily.       . metolazone (ZAROXOLYN) 2.5 MG tablet Take 1 tablet (2.5 mg total) by mouth daily as needed. 1/2 hour before furosemide for excessive swelling in legs  30 tablet  6  . metoprolol (LOPRESSOR) 50 MG tablet TAKE 1 TABLET TWICE DAILY.  180 tablet  3  . Multiple Vitamin (MULTIVITAMIN WITH MINERALS) TABS Take 1 tablet by mouth daily. Centrum Silver      . simvastatin (ZOCOR) 20 MG tablet  Take 1 tablet (20 mg total) by mouth at bedtime.  90 tablet  3  . tamsulosin (FLOMAX) 0.4 MG CAPS capsule Take 1 capsule (0.4 mg total) by mouth daily.  90 capsule  3  . traMADol (ULTRAM) 50 MG tablet TAKE 1 TABLET BY MOUTH 3 TIMES A DAY AS NEEDED (MAX 8 TABLETS PER DAY)  90 tablet  0   No current facility-administered medications for this visit.     Physical Exam: BP 118/56  Pulse 82  Resp 20  Ht 5\' 10"  (1.778 m)  Wt 228 lb (103.42 kg)  BMI 32.71 kg/m2  SpO2 % He looks well.  Cardiac exam shows an irregular rate and rhythm. There is a 1/6 systolic murmur. There is no diastolic murmur.  Lungs are clear.   Diagnostic Tests:  CLINICAL DATA: Thoracic aortic aneurysm.  EXAM:  CT ANGIOGRAPHY CHEST WITH CONTRAST  TECHNIQUE:  Multidetector CT imaging of the chest was performed using the  standard protocol during bolus administration of intravenous  contrast. Multiplanar CT image reconstructions and MIPs were  obtained to evaluate the vascular anatomy.  CONTRAST: 12mL OMNIPAQUE IOHEXOL 300 MG/ML SOLN  COMPARISON: 09/08/2012 and 09/02/2011.  FINDINGS:  Mediastinal lymph nodes are not enlarged by CT size criteria. No  hilar or axillary lymph nodes. Ascending aorta measures up to 4.9  cm, stable. Transverse aorta measures up to 4.4 cm, also stable.  Descending thoracic aorta measures up to 4.8 cm in the midportion,  where there is a stable dissection flap. Pulmonary arteries and  heart are enlarged. No pericardial effusion.  Mild subpleural scarring in the upper lobes. Mild scarring at the  lung bases. No pleural fluid. Airway is unremarkable.  Incidental imaging of the upper abdomen shows the liver margin to be  possibly slightly irregular. Stones are seen in the gallbladder.  Right adrenal gland is unremarkable. There may be slight thickening  of the left adrenal gland, unchanged. 2.2 cm low-attenuation lesion  in the left kidney is unchanged from 09/02/2011, indicative of a    cyst. Visualized portions of the spleen, pancreas, stomach and bowel  are grossly unremarkable. No upper abdominal adenopathy. No  worrisome lytic or sclerotic lesions. Degenerative changes are seen  in the spine. Median sternotomy.  Review of the MIP images confirms the above findings.  IMPRESSION:  1. Stable ascending, transverse and descending thoracic aortic  aneurysms, with a dissection flap in the descending thoracic aorta,  as before.  2. Enlarged pulmonary arteries, indicative of pulmonary arterial  hypertension.  3. Question mild cirrhosis.  4. Cholelithiasis.  Electronically Signed  By: Lorin Picket M.D.  On: 09/07/2013 09:56    Impression:  He has stable aneurysmal enlargement of his aortic arch and descending thoracic aorta. This is unchanged from his scan last year. Given his advanced age and comorbidities I think it is best to continue following this.   Plan:  I will plan to see him back in one year with a CT angiogram of the chest.

## 2013-11-08 ENCOUNTER — Other Ambulatory Visit: Payer: Self-pay | Admitting: *Deleted

## 2013-11-08 MED ORDER — METOPROLOL TARTRATE 50 MG PO TABS: 50.0000 mg | ORAL_TABLET | Freq: Two times a day (BID) | ORAL | Status: DC

## 2013-11-08 NOTE — Telephone Encounter (Signed)
Rx refill sent to patient pharmacy   

## 2013-11-11 ENCOUNTER — Other Ambulatory Visit: Payer: Self-pay | Admitting: Internal Medicine

## 2013-11-14 NOTE — Telephone Encounter (Signed)
rx called into pharmacy .left message to have patient return my call.

## 2013-11-14 NOTE — Telephone Encounter (Signed)
08/10/13 

## 2013-11-14 NOTE — Telephone Encounter (Signed)
Okay #[phone] x [phone] Have him set up a follow up with me soon

## 2014-01-25 ENCOUNTER — Ambulatory Visit: Payer: PRIVATE HEALTH INSURANCE | Admitting: Internal Medicine

## 2014-01-25 ENCOUNTER — Ambulatory Visit (INDEPENDENT_AMBULATORY_CARE_PROVIDER_SITE_OTHER): Payer: Medicare Other | Admitting: Internal Medicine

## 2014-01-25 ENCOUNTER — Encounter: Payer: Self-pay | Admitting: Internal Medicine

## 2014-01-25 VITALS — BP 118/60 | HR 52 | Temp 97.7°F | Wt 233.0 lb

## 2014-01-25 DIAGNOSIS — J305 Allergic rhinitis due to food: Secondary | ICD-10-CM

## 2014-01-25 DIAGNOSIS — I2510 Atherosclerotic heart disease of native coronary artery without angina pectoris: Secondary | ICD-10-CM

## 2014-01-25 NOTE — Assessment & Plan Note (Signed)
Probably vasomotor No infection or allergies Will try loratadine first If not effective, will prescribe ipratropium spray

## 2014-01-25 NOTE — Patient Instructions (Signed)
Please try over the counter loratadine 10mg  in the morning (or even twice a day). If your post nasal drip is not better within 2 weeks, let me know and I will prescribe the nasal spray.

## 2014-01-25 NOTE — Progress Notes (Signed)
Subjective:    Patient ID: Lee Gutierrez, male    DOB: 09-Sep-1930, 80 y.o.   MRN: [id number]  HPI Here with SIL  Has had about a month of post nasal drip Only with eating Not with bathroom  Not sick No fever No sig cough No prior problems--never had allergies SIL thinks it goes back longer  Hasn't tried any meds  Current Outpatient Prescriptions on File Prior to Visit  Medication Sig Dispense Refill  . amLODipine (NORVASC) 5 MG tablet TAKE 1 TABLET BY MOUTH EVERY DAY  90 tablet  2  . amoxicillin (AMOXIL) 500 MG capsule Take 2,000 mg by mouth as needed. Prior to dental appointment      . aspirin 81 MG tablet Take 81 mg by mouth daily.      . Cholecalciferol (VITAMIN D) 2000 UNITS tablet Take 2,000 Units by mouth daily.      . cholestyramine light (PREVALITE) 4 G packet Take 1 packet (4 g total) by mouth 2 (two) times daily.  60 packet  11  . digoxin (LANOXIN) 0.125 MG tablet Take 1 tablet (0.125 mg total) by mouth daily.  90 tablet  3  . enalapril (VASOTEC) 20 MG tablet TAKE 1 TABLET TWICE A DAY (THIS REPLACES THE AMLODIPINE)  180 tablet  3  . furosemide (LASIX) 40 MG tablet TAKE 2 TABLETS (80 MG TOTAL) BY MOUTH DAILY.  180 tablet  2  . KLOR-CON M10 10 MEQ tablet Take 20 mEq by mouth daily.       . metolazone (ZAROXOLYN) 2.5 MG tablet Take 1 tablet (2.5 mg total) by mouth daily as needed. 1/2 hour before furosemide for excessive swelling in legs  30 tablet  6  . metoprolol (LOPRESSOR) 50 MG tablet Take 1 tablet (50 mg total) by mouth 2 (two) times daily.  180 tablet  3  . Multiple Vitamin (MULTIVITAMIN WITH MINERALS) TABS Take 1 tablet by mouth daily. Centrum Silver      . simvastatin (ZOCOR) 20 MG tablet Take 1 tablet (20 mg total) by mouth at bedtime.  90 tablet  3  . tamsulosin (FLOMAX) 0.4 MG CAPS capsule Take 1 capsule (0.4 mg total) by mouth daily.  90 capsule  3  . traMADol (ULTRAM) 50 MG tablet TAKE 1 TABLET 3 TIMES A DAY (MAX 8 TABS PER DAY)  90 tablet  0   No current  facility-administered medications on file prior to visit.    No Known Allergies  Past Medical History  Diagnosis Date  . Hypertension   . Arthritis   . Coronary artery disease   . 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 5 x 4.9, proximal descending thoracic aortic aorta measuring 5.8 x 5.1, the aortic 5.6 x 5.5 and distal thoracic aorta 3.9 x 4.1, His infrarenal aneurysm measured 3 x 2.7.  . Shortness of breath   . Aortic dissection   . Dysrhythmia     atrial fib  . Blood transfusion   . CHF, acute on chronic 03/24/2011  . HOH (hard of hearing)     bilateral hearing aids  . Hx of echocardiogram 12/2011    EF 40% showed severe LA dilation, He had moderate concentric LHV. He has moderate inferior wall hypokinesis. There was evidence for pulmonary hypertension with an estimated RV systolic pressure at 62. He had moderate TR  . History of stress test 12/2011    Showed mild inferior thinning felt most likely due to attenuation artifact.  Past Surgical History  Procedure Laterality Date  . Replacement total knee      bilaterally  . Joint replacement      Bilateral knee  . Tonsillectomy    . Hernia repair    . Cardiac catheterization    . Coronary artery bypass graft  Feb 2011  . Ankle fusion  04/22/2012    Procedure: ARTHRODESIS ANKLE;  Surgeon: Wylene Simmer, MD;  Location: [place];  Service: Orthopedics;  Laterality: Left;  Left Ankle and Subtalar Arthrodesis     Family History  Problem Relation Age of Onset  . Hypertension Father   . COPD Brother   . Diabetes Neg Hx   . Cancer Neg Hx     History   Social History  . Marital Status: Widowed    Spouse Name: N/A    Number of Children: 3  . Years of Education: N/A   Occupational History  . retired Architect    Social History Main Topics  . Smoking status: Former Smoker    Quit date: 02/04/1963  . Smokeless tobacco: Never Used  . Alcohol Use: No  . Drug Use: No  . Sexual Activity:  Not Currently   Other Topics Concern  . Not on file   Social History Narrative   No living will. Wife to make decisions, then Lee Gutierrez, daughter.  Would want resuscitation attempts but no prolonged artificial ventilation. Not sure about tube feedings   Review of Systems Bowels are better---uses the cholestyramine only occasional. Rare incontinence now Appetite is okay weight fairly stable    Objective:   Physical Exam  Constitutional: He appears well-developed and well-nourished. No distress.  HENT:  Mouth/Throat: Oropharynx is clear and moist. No oropharyngeal exudate.  Mild pale nasal congestion          Assessment & Plan:

## 2014-01-25 NOTE — Progress Notes (Signed)
Pre visit review using our clinic review tool, if applicable. No additional management support is needed unless otherwise documented below in the visit note. 

## 2014-02-08 ENCOUNTER — Ambulatory Visit (INDEPENDENT_AMBULATORY_CARE_PROVIDER_SITE_OTHER): Payer: Medicare Other | Admitting: Cardiovascular Disease

## 2014-02-08 ENCOUNTER — Encounter: Payer: Self-pay | Admitting: Cardiovascular Disease

## 2014-02-08 VITALS — BP 142/80 | HR 62 | Ht 70.0 in | Wt 234.1 lb

## 2014-02-08 DIAGNOSIS — I7100 Dissection of unspecified site of aorta: Secondary | ICD-10-CM

## 2014-02-08 DIAGNOSIS — Z79899 Other long term (current) drug therapy: Secondary | ICD-10-CM

## 2014-02-08 DIAGNOSIS — I4891 Unspecified atrial fibrillation: Secondary | ICD-10-CM

## 2014-02-08 DIAGNOSIS — I712 Thoracic aortic aneurysm, without rupture, unspecified: Secondary | ICD-10-CM

## 2014-02-08 DIAGNOSIS — E782 Mixed hyperlipidemia: Secondary | ICD-10-CM

## 2014-02-08 DIAGNOSIS — I10 Essential (primary) hypertension: Secondary | ICD-10-CM

## 2014-02-08 DIAGNOSIS — E785 Hyperlipidemia, unspecified: Secondary | ICD-10-CM

## 2014-02-08 DIAGNOSIS — I2510 Atherosclerotic heart disease of native coronary artery without angina pectoris: Secondary | ICD-10-CM

## 2014-02-08 NOTE — Progress Notes (Signed)
Patient ID: Lee Gutierrez, male   DOB: 1930-07-27, 80 y.o.   MRN: [id number]      HPI: Lee Gutierrez is a 80 y.o. male who presents for a 6 month cardiology evaluation.  Mr. Caspers has a long-standing history of type III aortic dissection extending into his abdominal aorta and is now followed by Dr. Cyndia Bent. In February 2011 he underwent CABG surgery for severe coronary artery disease after catheterization by me revealed high grade left main stenosis. The LIMA was placed to the LAD, vein to the marginal, vein to the second diagonal. A CT scan in April 2013 showed a 5 x 4.9 proximal descending thoracic aneurysm in the descending thoracic aneurysm measured 5.8 x 5.1 and at the aortic arch 5.6 x 5.5 and the distal thoracic aorta at 3.9 x 4.1. In December 2013 he underwent left ankle surgery and tolerated this well from a cardiovascular standpoint. His wife was diagnosed with glioblastoma last year, and unfortunately since I last saw him she died in 03/08/2013 at age 44. Currently, he is living by himself.   He has history of permanent atrial fibrillation. Because of the aneurysm and type III aortic dissection is only on aspirin rather than full dose anticoagulation.  Since I last saw him in March 2015, he admits to feeling well on his current medical regimen.  His atrial fibrillation.  Rate has been controlled with Lopressor 50 mg twice a day.  In addition to his Lanoxin 0.0625 milligrams.  He has been on Vasotec 20 mg twice a day.  In addition to furosemide 80 mg daily for blood pressure and peripheral edema.  He has not required recent use of metolazone.  He has hyperlipidemia for which he has been taking simvastatin 20 mg at bedtime and tolerating well without myalgias.  He denies recent chest pain. He denies significant shortness of breath. He is unaware of arrhythmias. At times he does note increasing leg swelling and when he does if his weight is up by 3 pounds he takes a dose of Zaroxolyn. He  denies palpitations. He denies abdominal pain. He presents for evaluation and is here with his son-in-law.  Past Medical History  Diagnosis Date  . Hypertension   . Arthritis   . Coronary artery disease   . 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 5 x 4.9, proximal descending thoracic aortic aorta measuring 5.8 x 5.1, the aortic 5.6 x 5.5 and distal thoracic aorta 3.9 x 4.1, His infrarenal aneurysm measured 3 x 2.7.  . Shortness of breath   . Aortic dissection   . Dysrhythmia     atrial fib  . Blood transfusion   . CHF, acute on chronic 03/24/2011  . HOH (hard of hearing)     bilateral hearing aids  . Hx of echocardiogram 12/2011    EF 40% showed severe LA dilation, He had moderate concentric LHV. He has moderate inferior wall hypokinesis. There was evidence for pulmonary hypertension with an estimated RV systolic pressure at 62. He had moderate TR  . History of stress test 12/2011    Showed mild inferior thinning felt most likely due to attenuation artifact.    Past Surgical History  Procedure Laterality Date  . Replacement total knee      bilaterally  . Joint replacement      Bilateral knee  . Tonsillectomy    . Hernia repair    . Cardiac catheterization    . Coronary artery bypass graft  Feb 2011  . Ankle fusion  04/22/2012    Procedure: ARTHRODESIS ANKLE;  Surgeon: Wylene Simmer, MD;  Location: [facility];  Service: Orthopedics;  Laterality: Left;  Left Ankle and Subtalar Arthrodesis     No Known Allergies  Current Outpatient Prescriptions  Medication Sig Dispense Refill  . amoxicillin (AMOXIL) 500 MG capsule Take 2,000 mg by mouth as needed. Prior to dental appointment      . aspirin 81 MG tablet Take 81 mg by mouth daily.      . Cholecalciferol (VITAMIN D) 2000 UNITS tablet Take 2,000 Units by mouth daily.      . cholestyramine light (PREVALITE) 4 G packet Take 1 packet (4 g total) by mouth 2 (two) times daily.  60 packet  11  . digoxin  (LANOXIN) 0.125 MG tablet Take 0.0625 mg by mouth daily.      . enalapril (VASOTEC) 20 MG tablet TAKE 1 TABLET TWICE A DAY (THIS REPLACES THE AMLODIPINE)  180 tablet  3  . furosemide (LASIX) 40 MG tablet TAKE 2 TABLETS (80 MG TOTAL) BY MOUTH DAILY.  180 tablet  2  . KLOR-CON M10 10 MEQ tablet Take 20 mEq by mouth daily.       Marland Kitchen loratadine (CLARITIN) 10 MG tablet Take 10 mg by mouth daily.      . Melatonin 10 MG TABS Take 1 tablet by mouth at bedtime.      . metolazone (ZAROXOLYN) 2.5 MG tablet Take 1 tablet (2.5 mg total) by mouth daily as needed. 1/2 hour before furosemide for excessive swelling in legs  30 tablet  6  . metoprolol (LOPRESSOR) 50 MG tablet Take 1 tablet (50 mg total) by mouth 2 (two) times daily.  180 tablet  3  . Multiple Vitamin (MULTIVITAMIN WITH MINERALS) TABS Take 1 tablet by mouth daily. Centrum Silver      . simvastatin (ZOCOR) 20 MG tablet Take 1 tablet (20 mg total) by mouth at bedtime.  90 tablet  3  . tamsulosin (FLOMAX) 0.4 MG CAPS capsule Take 1 capsule (0.4 mg total) by mouth daily.  90 capsule  3  . traMADol (ULTRAM) 50 MG tablet TAKE 1 TABLET 3 TIMES A DAY (MAX 8 TABS PER DAY)  90 tablet  0   No current facility-administered medications for this visit.    History   Social History  . Marital Status: Widowed    Spouse Name: N/A    Number of Children: 3  . Years of Education: N/A   Occupational History  . retired Architect    Social History Main Topics  . Smoking status: Former Smoker    Quit date: 02/04/1963  . Smokeless tobacco: Never Used  . Alcohol Use: No  . Drug Use: No  . Sexual Activity: Not Currently   Other Topics Concern  . Not on file   Social History Narrative   No living will. Wife to make decisions, then [name], daughter.  Would want resuscitation attempts but no prolonged artificial ventilation. Not sure about tube feedings    Family History  Problem Relation Age of Onset  . Hypertension Father   . COPD Brother   . Diabetes  Neg Hx   . Cancer Neg Hx    Socially he is recently widowed and has 3 children 7 grandchildren. He is originally from the [place] area. He now lives in [place]. There is no tobacco or alcohol use.  ROS General: Negative; No fevers, chills, or night sweats;  HEENT:  Negative; No changes in vision or hearing, sinus congestion, difficulty swallowing Pulmonary: Negative; No cough, wheezing, shortness of breath, hemoptysis Cardiovascular: Negative; No chest pain, presyncope, syncope, palpitations GI: Negative; No nausea, vomiting, diarrhea, or abdominal pain GU: Negative; No dysuria, hematuria, or difficulty voiding Musculoskeletal: Negative; no myalgias, joint pain, or weakness Hematologic/Oncology: Negative; no easy bruising, bleeding Endocrine: Negative; no heat/cold intolerance; no diabetes Neuro: Negative; no changes in balance, headaches Skin: Negative; No rashes or skin lesions Psychiatric: Negative; No behavioral problems, depression Sleep: Negative; No snoring, daytime sleepiness, hypersomnolence, bruxism, restless legs, hypnogognic hallucinations, no cataplexy Other comprehensive 14 point system review is negative.  PE BP 142/80  Pulse 62  Ht 5\' 10"  (1.778 m)  Wt 234 lb 1.6 oz (106.187 kg)  BMI 33.59 kg/m2  General: Alert, oriented, no distress.  Skin: normal turgor, no rashes HEENT: Normocephalic, atraumatic. Pupils round and reactive; sclera anicteric;no lid lag.  Nose without nasal septal hypertrophy; no xanthelasmas Mouth/Parynx benign; Mallinpatti scal 3 Neck: No JVD, no carotid bruits Lungs: clear to ausculatation and percussion; no wheezing or rales Chest wall: Nontender to palpation Heart: Irregularly irregular with a controlled ventricular response approximately 60, s1 s2 normal 1/6 systolic murmur; no diastolic murmur; occasional ectopic, Abdomen: Moderately large diastases recti; soft, nontender; no hepatosplenomehaly, BS+; abdominal aorta nontender and not  dilated by palpation. Back: No CVA Pulses 2+ Extremities: Trivial ankle edema today no clubbing cyanosis, Homan's sign negative  Neurologic: grossly nonfocal Psychological: Normal affect and mood  ECG (independently read by me): Atrial fibrillation at 62 beats per minute.  Occasional PVCs with left bundle branch morphology.  Prior March 2015 ECG (independently read by me): Atrial fibrillation with a controlled ventricular response in the 60s. Nonspecific ST changes.  Prior ECG: Atrial fibrillation with a controlled ventricular response in the mid to upper 50s  LABS:  BMET    Component Value Date/Time   NA 136 03/21/2013 1009   K 4.0 03/21/2013 1009   CL 101 03/21/2013 1009   CO2 29 03/21/2013 1009   GLUCOSE 69* 03/21/2013 1009   BUN 23 09/05/2013 1033   CREATININE 0.77 09/05/2013 1033   CREATININE 0.8 03/21/2013 1009   CALCIUM 9.5 03/21/2013 1009   GFRNONAA 69* 04/12/2012 0922   GFRAA 79* 04/12/2012 0922     Hepatic Function Panel     Component Value Date/Time   PROT 6.3 03/21/2013 1009   ALBUMIN 3.7 03/21/2013 1009   AST 24 03/21/2013 1009   ALT 19 03/21/2013 1009   ALKPHOS 64 03/21/2013 1009   BILITOT 1.0 03/21/2013 1009   BILIDIR 0.1 03/21/2013 1009     CBC    Component Value Date/Time   WBC 5.7 03/21/2013 1009   RBC 3.60* 03/21/2013 1009   HGB 11.8* 03/21/2013 1009   HCT 35.4* 03/21/2013 1009   PLT 140.0* 03/21/2013 1009   MCV 98.4 03/21/2013 1009   MCH 33.1 04/12/2012 0922   MCHC 33.4 03/21/2013 1009   RDW 14.8* 03/21/2013 1009   LYMPHSABS 0.8 03/21/2013 1009   MONOABS 0.6 03/21/2013 1009   EOSABS 0.1 03/21/2013 1009   BASOSABS 0.0 03/21/2013 1009     BNP    Component Value Date/Time   PROBNP 1273.0* 04/01/2011 0949    Lipid Panel     Component Value Date/Time   CHOL 113 03/21/2013 1009   TRIG 50.0 03/21/2013 1009   HDL 60.10 03/21/2013 1009   CHOLHDL 2 03/21/2013 1009   VLDL 10.0 03/21/2013 1009   LDLCALC 43 03/21/2013 1009  RADIOLOGY: No results found.    ASSESSMENT AND PLAN:  Mr. Jun is an 80 years old WM who has long-standing type III aortic dissection which is continuously follow by periodic CT imaging, originally while he lived in [place] and more recently followed by Dr. Cyndia Bent. He is now 4 1/2  years status post CABG surgery. His last nuclear perfusion study  in August 2013  showed fairly normal perfusion. Presently he is doing well without angina. He has permanent atrial fibrillation his ventricular rate is well controlled with metoprolol and Lanoxin therapy.  He is unaware of any ventricular ectopy.  He denies presyncope or syncope. His blood pressure today is stable at 128/80 .  When rechecked by me on  enalapril 20 mg in addition to his beta blocker therapy. Laboratory from last November continued to show excellent control of his lipids with an LDL cholesterol of 43 on simvastatin 20 mg. he is tolerating reduced dose of aspirin, which was lowered last year.  I am recommending fasting laboratory be obtained, including a CBC, comprehensive metabolic panel, digoxin level, TSH, and lipid panel.  Adjustments will be made to his medical regimen if necessary.  Otherwise, I will see him in 6 months for cardiology reevaluation.   Time spent: 25 minutes  Troy Sine, MD, [facility]  02/08/2014 4:30 PM

## 2014-02-08 NOTE — Patient Instructions (Signed)
Your physician recommends that you return for lab work fasting.  Your physician wants you to follow-up in: 6 months or sooner if needed. You will receive a reminder letter in the mail two months in advance. If you don't receive a letter, please call our office to schedule the follow-up appointment. No changes were made today in your therapy.

## 2014-02-11 LAB — COMPREHENSIVE METABOLIC PANEL
ALBUMIN: 4 g/dL (ref 3.5–4.7)
ALK PHOS: 78 IU/L (ref 39–117)
ALT: 21 IU/L (ref 0–44)
AST: 24 IU/L (ref 0–40)
Albumin/Globulin Ratio: 1.9 (ref 1.1–2.5)
BUN/Creatinine Ratio: 20 (ref 10–22)
BUN: 19 mg/dL (ref 8–27)
CO2: 26 mmol/L (ref 18–29)
CREATININE: 0.93 mg/dL (ref 0.76–1.27)
Calcium: 9.7 mg/dL (ref 8.6–10.2)
Chloride: 97 mmol/L (ref 97–108)
GFR calc non Af Amer: 77 mL/min/{1.73_m2} (ref >59)
GFR, EST AFRICAN AMERICAN: 89 mL/min/{1.73_m2} (ref >59)
GLOBULIN, TOTAL: 2.1 g/dL (ref 1.5–4.5)
Glucose: 88 mg/dL (ref 65–99)
Potassium: 3.7 mmol/L (ref 3.5–5.2)
Sodium: 138 mmol/L (ref 134–144)
Total Bilirubin: 0.7 mg/dL (ref 0.0–1.2)
Total Protein: 6.1 g/dL (ref 6.0–8.5)

## 2014-02-11 LAB — CBC
HEMATOCRIT: 35.4 % — AB (ref 37.5–51.0)
HEMOGLOBIN: 11.8 g/dL — AB (ref 12.6–17.7)
MCH: 31.8 pg (ref 26.6–33.0)
MCHC: 33.3 g/dL (ref 31.5–35.7)
MCV: 95 fL (ref 79–97)
Platelets: 167 10*3/uL (ref 150–379)
RBC: 3.71 x10E6/uL — ABNORMAL LOW (ref 4.14–5.80)
RDW: 14.2 % (ref 12.3–15.4)
WBC: 5 10*3/uL (ref 3.4–10.8)

## 2014-02-11 LAB — LIPID PANEL
CHOL/HDL RATIO: 1.9 ratio (ref 0.0–5.0)
Cholesterol, Total: 120 mg/dL (ref 100–199)
HDL: 63 mg/dL (ref >39)
LDL CALC: 46 mg/dL (ref 0–99)
TRIGLYCERIDES: 56 mg/dL (ref 0–149)
VLDL CHOLESTEROL CAL: 11 mg/dL (ref 5–40)

## 2014-02-11 LAB — DIGOXIN LEVEL: Digoxin Level: <0.2 ng/mL — ABNORMAL LOW (ref 0.9–2.0)

## 2014-02-11 LAB — TSH: TSH: 1.73 u[IU]/mL (ref 0.450–4.500)

## 2014-02-13 ENCOUNTER — Other Ambulatory Visit: Payer: Self-pay | Admitting: Internal Medicine

## 2014-02-13 NOTE — Telephone Encounter (Signed)
Okay #90 x 0 

## 2014-02-13 NOTE — Telephone Encounter (Signed)
Electronic Rx request for tramadol. Last office visit 01/25/14 and last filled on 11/14/13. Please advise.

## 2014-02-14 NOTE — Telephone Encounter (Signed)
Rx called into [facility] pharmacy. 

## 2014-02-16 ENCOUNTER — Telehealth: Payer: Self-pay | Admitting: *Deleted

## 2014-02-16 NOTE — Telephone Encounter (Signed)
Informed patient of lab results. He voiced understanding of this although he was hard of hearing.

## 2014-02-16 NOTE — Telephone Encounter (Signed)
Message copied by Lauralee Evener on Thu Feb 16, 2014  3:34 PM ------      Message from: Shelva Majestic A      Created: Thu Feb 16, 2014  1:57 PM       Labs OK; mild anemia, no change ------

## 2014-02-28 ENCOUNTER — Other Ambulatory Visit: Payer: Self-pay | Admitting: Internal Medicine

## 2014-02-28 ENCOUNTER — Other Ambulatory Visit: Payer: Self-pay | Admitting: Cardiovascular Disease

## 2014-02-28 NOTE — Telephone Encounter (Signed)
Rx was sent to pharmacy electronically. 

## 2014-03-15 ENCOUNTER — Other Ambulatory Visit: Payer: Self-pay

## 2014-03-15 MED ORDER — AMLODIPINE BESYLATE 5 MG PO TABS: 5.0000 mg | ORAL_TABLET | Freq: Every day | ORAL | Status: DC

## 2014-03-15 NOTE — Telephone Encounter (Signed)
Rx sent to pharmacy   

## 2014-03-16 ENCOUNTER — Other Ambulatory Visit: Payer: Self-pay | Admitting: *Deleted

## 2014-03-16 MED ORDER — METOLAZONE 2.5 MG PO TABS: 2.5000 mg | ORAL_TABLET | Freq: Every day | ORAL | Status: DC | PRN

## 2014-03-16 NOTE — Telephone Encounter (Signed)
Refilled electronically 

## 2014-03-21 ENCOUNTER — Other Ambulatory Visit: Payer: Self-pay | Admitting: Cardiovascular Disease

## 2014-03-21 NOTE — Telephone Encounter (Signed)
E sent to pharmacy 

## 2014-04-05 ENCOUNTER — Other Ambulatory Visit: Payer: Self-pay | Admitting: Cardiovascular Disease

## 2014-04-05 NOTE — Telephone Encounter (Signed)
Rx has been sent to the pharmacy electronically. ° °

## 2014-04-15 ENCOUNTER — Other Ambulatory Visit: Payer: Self-pay | Admitting: Cardiovascular Disease

## 2014-04-15 NOTE — Telephone Encounter (Signed)
Rx was sent to pharmacy electronically. 

## 2014-04-30 ENCOUNTER — Other Ambulatory Visit: Payer: Self-pay | Admitting: Cardiovascular Disease

## 2014-05-01 NOTE — Telephone Encounter (Signed)
Rx refill sent to patient pharmacy   

## 2014-05-15 ENCOUNTER — Other Ambulatory Visit: Payer: Self-pay | Admitting: Internal Medicine

## 2014-05-15 NOTE — Telephone Encounter (Signed)
rx called into pharmacy

## 2014-05-15 NOTE — Telephone Encounter (Signed)
02/14/14 

## 2014-05-15 NOTE — Telephone Encounter (Signed)
Approved: #[id number] x [id number] 

## 2014-06-12 ENCOUNTER — Telehealth: Payer: Self-pay | Admitting: Internal Medicine

## 2014-06-12 DIAGNOSIS — M25569 Pain in unspecified knee: Secondary | ICD-10-CM

## 2014-06-12 NOTE — Telephone Encounter (Signed)
Lee Gutierrez came in to leave handicapp form  On dee's desks  Also he stated Lee Gutierrez needed to get a referral from Lee Gutierrez to go see Lee Gutierrez for ankle pain.  Lee Gutierrez tried to make appointment and was told he needed a referral

## 2014-06-12 NOTE — Telephone Encounter (Signed)
Filled out form and placed it in Dr. Alla German in basket for completion and signature.

## 2014-06-13 NOTE — Telephone Encounter (Signed)
See note about referral.

## 2014-06-14 NOTE — Telephone Encounter (Signed)
Referral done

## 2014-07-05 ENCOUNTER — Encounter: Payer: Self-pay | Admitting: Internal Medicine

## 2014-07-05 ENCOUNTER — Ambulatory Visit (INDEPENDENT_AMBULATORY_CARE_PROVIDER_SITE_OTHER): Payer: Medicare Other | Admitting: Internal Medicine

## 2014-07-05 VITALS — BP 130/80 | HR 61 | Temp 97.3°F | Wt 234.0 lb

## 2014-07-05 DIAGNOSIS — I4819 Other persistent atrial fibrillation: Secondary | ICD-10-CM

## 2014-07-05 DIAGNOSIS — H9190 Unspecified hearing loss, unspecified ear: Secondary | ICD-10-CM | POA: Insufficient documentation

## 2014-07-05 DIAGNOSIS — I481 Persistent atrial fibrillation: Secondary | ICD-10-CM

## 2014-07-05 DIAGNOSIS — R5383 Other fatigue: Secondary | ICD-10-CM

## 2014-07-05 DIAGNOSIS — H9193 Unspecified hearing loss, bilateral: Secondary | ICD-10-CM

## 2014-07-05 DIAGNOSIS — I5032 Chronic diastolic (congestive) heart failure: Secondary | ICD-10-CM

## 2014-07-05 NOTE — Assessment & Plan Note (Signed)
Good rate control No coumadin due to type B-- aortic dissection

## 2014-07-05 NOTE — Assessment & Plan Note (Signed)
This has worsened Will make referral for eval at [facility]

## 2014-07-05 NOTE — Assessment & Plan Note (Signed)
Seems compensated Stable fluid status No changes needed

## 2014-07-05 NOTE — Progress Notes (Signed)
Pre visit review using our clinic review tool, if applicable. No additional management support is needed unless otherwise documented below in the visit note. 

## 2014-07-05 NOTE — Progress Notes (Signed)
Subjective:    Patient ID: Lee Gutierrez, male    DOB: [birth year], 80 y.o.   MRN: [id number]  HPI Here with SIL  Has seemed worn out this past weekend He notes his mind was spinning some---awakening at night and then not staying asleep Some melancholy No regular sadness He enjoys daily mass, coffee with friends there (social group) Doing rehab twice a week  Daughter/SIL plan to convert room in their house for him He is okay with this  The runny nose is better Not clearly helped by the loratadine  No chest pain No SOB other than in evening at times---inhaler helps prn Still with DOE-- uses cane and walker  Ongoing ankle pain is the main limitation--- just saw Dr Jefm Bryant and was helped by cortisone shot  Current Outpatient Prescriptions on File Prior to Visit  Medication Sig Dispense Refill  . amLODipine (NORVASC) 5 MG tablet Take 1 tablet (5 mg total) by mouth daily. 90 tablet 4  . amoxicillin (AMOXIL) 500 MG capsule Take 2,000 mg by mouth as needed. Prior to dental appointment    . aspirin 81 MG tablet Take 81 mg by mouth daily.    . Cholecalciferol (VITAMIN D) 2000 UNITS tablet Take 2,000 Units by mouth daily.    . cholestyramine light (PREVALITE) 4 G packet Take 1 packet (4 g total) by mouth 2 (two) times daily. 60 packet 11  . digoxin (LANOXIN) 0.125 MG tablet Take 0.5 tablets (0.0625 mg total) by mouth daily. 90 tablet 1  . enalapril (VASOTEC) 20 MG tablet TAKE 1 TABLET TWICE A DAY (THIS REPLACES THE AMLODIPINE) 180 tablet 3  . furosemide (LASIX) 40 MG tablet TAKE 2 TABLETS (80 MG TOTAL) BY MOUTH DAILY. 180 tablet 2  . KLOR-CON M10 10 MEQ tablet TAKE 2 TABLETS BY MOUTH EVERY DAY 180 tablet 1  . loratadine (CLARITIN) 10 MG tablet Take 10 mg by mouth daily.    . Melatonin 10 MG TABS Take 1 tablet by mouth at bedtime.    . metolazone (ZAROXOLYN) 2.5 MG tablet Take 1 tablet (2.5 mg total) by mouth daily as needed. 1/2 hour before furosemide for excessive swelling in legs  30 tablet 6  . metoprolol (LOPRESSOR) 50 MG tablet Take 1 tablet (50 mg total) by mouth 2 (two) times daily. 180 tablet 3  . Multiple Vitamin (MULTIVITAMIN WITH MINERALS) TABS Take 1 tablet by mouth daily. Centrum Silver    . simvastatin (ZOCOR) 20 MG tablet TAKE 1 TABLET BY MOUTH AT BEDTIME. 90 tablet 3  . tamsulosin (FLOMAX) 0.4 MG CAPS capsule TAKE 1 CAPSULE (0.4 MG TOTAL) BY MOUTH DAILY. 90 capsule 3  . traMADol (ULTRAM) 50 MG tablet Take 1 tablet (50 mg total) by mouth 3 (three) times daily as needed. 90 tablet 0   No current facility-administered medications on file prior to visit.    No Known Allergies  Past Medical History  Diagnosis Date  . Hypertension   . Arthritis   . Coronary artery disease   . 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 5 x 4.9, proximal descending thoracic aortic aorta measuring 5.8 x 5.1, the aortic 5.6 x 5.5 and distal thoracic aorta 3.9 x 4.1, His infrarenal aneurysm measured 3 x 2.7.  . Shortness of breath   . Aortic dissection   . Dysrhythmia     atrial fib  . Blood transfusion   . CHF, acute on chronic 03/24/2011  . HOH (hard of hearing)  bilateral hearing aids  . Hx of echocardiogram 12/2011    EF 40% showed severe LA dilation, He had moderate concentric LHV. He has moderate inferior wall hypokinesis. There was evidence for pulmonary hypertension with an estimated RV systolic pressure at 62. He had moderate TR  . History of stress test 12/2011    Showed mild inferior thinning felt most likely due to attenuation artifact.    Past Surgical History  Procedure Laterality Date  . Replacement total knee      bilaterally  . Joint replacement      Bilateral knee  . Tonsillectomy    . Hernia repair    . Cardiac catheterization    . Coronary artery bypass graft  Feb 2011  . Ankle fusion  04/22/2012    Procedure: ARTHRODESIS ANKLE;  Surgeon: Wylene Simmer, MD;  Location: [facility];  Service: Orthopedics;  Laterality: Left;   Left Ankle and Subtalar Arthrodesis     Family History  Problem Relation Age of Onset  . Hypertension Father   . COPD Brother   . Diabetes Neg Hx   . Cancer Neg Hx      Review of Systems Appetite is good Daughter cooks for him Weight is stable Hearing is worse    Objective:   Physical Exam  Constitutional: He appears well-developed and well-nourished. No distress.  Neck: Normal range of motion. Neck supple. No thyromegaly present.  Cardiovascular: Normal rate and normal heart sounds.  Exam reveals no gallop.   No murmur heard. irregular  Pulmonary/Chest: Effort normal and breath sounds normal. No respiratory distress. He has no wheezes. He has no rales.  Abdominal: Soft. There is no tenderness.  Musculoskeletal:  Thick calves without pitting  Lymphadenopathy:    He has no cervical adenopathy.  Psychiatric: He has a normal mood and affect. His behavior is normal.          Assessment & Plan:

## 2014-07-05 NOTE — Assessment & Plan Note (Signed)
Vague this weekend Seems better now No acute illness May be due to a lot on his mind and not sleeping as well Observation only

## 2014-07-10 ENCOUNTER — Encounter: Payer: Self-pay | Admitting: Internal Medicine

## 2014-07-26 ENCOUNTER — Other Ambulatory Visit: Payer: Self-pay | Admitting: *Deleted

## 2014-07-26 DIAGNOSIS — I712 Thoracic aortic aneurysm, without rupture, unspecified: Secondary | ICD-10-CM

## 2014-07-31 ENCOUNTER — Ambulatory Visit (INDEPENDENT_AMBULATORY_CARE_PROVIDER_SITE_OTHER): Payer: Medicare Other | Admitting: Cardiovascular Disease

## 2014-07-31 VITALS — BP 120/78 | HR 57 | Ht 71.0 in | Wt 233.6 lb

## 2014-07-31 DIAGNOSIS — I2510 Atherosclerotic heart disease of native coronary artery without angina pectoris: Secondary | ICD-10-CM | POA: Diagnosis not present

## 2014-07-31 DIAGNOSIS — I712 Thoracic aortic aneurysm, without rupture, unspecified: Secondary | ICD-10-CM

## 2014-07-31 DIAGNOSIS — I482 Chronic atrial fibrillation, unspecified: Secondary | ICD-10-CM

## 2014-07-31 NOTE — Patient Instructions (Signed)
Your physician wants you to follow-up in: October 2016. You will receive a reminder letter in the mail two months in advance. If you don't receive a letter, please call our office to schedule the follow-up appointment.

## 2014-08-02 ENCOUNTER — Encounter: Payer: Self-pay | Admitting: Cardiovascular Disease

## 2014-08-02 NOTE — Progress Notes (Signed)
Patient ID: Lee Gutierrez, male   DOB: Oct 04, 1930, 81 y.o.   MRN: [id number]     HPI: Lee Gutierrez is a 81 y.o. male who presents for a 6 month cardiology evaluation.  He is here with his son.  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 Cyndia Gutierrez. In February 2011 he underwent CABG surgery for severe CAD after catheterization by me revealed high grade left main stenosis. The LIMA was placed to the LAD, vein to the marginal, vein to the second diagonal. A CT scan in April 2013 showed a 5 x 4.9 proximal descending thoracic aneurysm in the descending thoracic aneurysm measured 5.8 x 5.1 and at the aortic arch 5.6 x 5.5 and the distal thoracic aorta at 3.9 x 4.1. In December 2013 he underwent left ankle surgery and tolerated this well from a cardiovascular standpoint. His wife was diagnosed with glioblastoma last year, and unfortunately  she died in Mar 12, 2013 at age 109. Currently, he is living by himself , but his son and daughter-in-law are caring for him.  He has history of permanent atrial fibrillation. Because of the aneurysm and type III aortic dissection is only on aspirin rather than full dose anticoagulation.  Since I last saw him in September 2015, he admits to feeling well on his current medical regimen.  His atrial fibrillation rate has been controlled with Lopressor 50 mg twice a day and Lanoxin 0.0625 milligrams.  He has been on Vasotec 20 mg twice a day, amlodipine 5 mg daily and furosemide 80 mg daily for blood pressure and peripheral edema.  He has not required recent use of metolazone.  He has hyperlipidemia for which he has been taking simvastatin 20 mg at bedtime and tolerating well without myalgias.  He denies recent chest pain. He denies significant shortness of breath. He is unaware of arrhythmias. At times he does note increasing leg swelling and when he does if his weight is up by 3 pounds he  takes a dose of Zaroxolyn. He denies palpitations. He denies abdominal pain. He presents for evaluation and is here with his son-in-law.  Past Medical History  Diagnosis Date  . Hypertension   . Arthritis   . Coronary artery disease   . 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 5 x 4.9, proximal descending thoracic aortic aorta measuring 5.8 x 5.1, the aortic 5.6 x 5.5 and distal thoracic aorta 3.9 x 4.1, His infrarenal aneurysm measured 3 x 2.7.  . Shortness of breath   . Aortic dissection   . Dysrhythmia     atrial fib  . Blood transfusion   . CHF, acute on chronic 03/24/2011  . HOH (hard of hearing)     bilateral hearing aids  . Hx of echocardiogram 12/2011    EF 40% showed severe LA dilation, He had moderate concentric LHV. He has moderate inferior wall hypokinesis. There was evidence for pulmonary hypertension with an estimated RV systolic pressure at 62. He had moderate TR  . History of stress test 12/2011    Showed mild inferior thinning felt most likely due to attenuation artifact.    Past Surgical History  Procedure Laterality Date  . Replacement total knee      bilaterally  . Joint replacement      Bilateral knee  . Tonsillectomy    . Hernia repair    . Cardiac catheterization    .  Coronary artery bypass graft  Feb 2011  . Ankle fusion  04/22/2012    Procedure: ARTHRODESIS ANKLE;  Surgeon: Lee Simmer, MD;  Location: [facility];  Service: Orthopedics;  Laterality: Left;  Left Ankle and Subtalar Arthrodesis     No Known Allergies  Current Outpatient Prescriptions  Medication Sig Dispense Refill  . amLODipine (NORVASC) 5 MG tablet Take 1 tablet (5 mg total) by mouth daily. 90 tablet 4  . amoxicillin (AMOXIL) 500 MG capsule Take 2,000 mg by mouth as needed. Prior to dental appointment    . aspirin 81 MG tablet Take 81 mg by mouth daily.    . Cholecalciferol (VITAMIN D) 2000 UNITS tablet Take 2,000 Units by mouth daily.    . cholestyramine  light (PREVALITE) 4 G packet Take 1 packet (4 g total) by mouth 2 (two) times daily. 60 packet 11  . digoxin (LANOXIN) 0.125 MG tablet Take 0.5 tablets (0.0625 mg total) by mouth daily. 90 tablet 1  . enalapril (VASOTEC) 20 MG tablet TAKE 1 TABLET TWICE A DAY (THIS REPLACES THE AMLODIPINE) 180 tablet 3  . furosemide (LASIX) 40 MG tablet TAKE 2 TABLETS (80 MG TOTAL) BY MOUTH DAILY. 180 tablet 2  . KLOR-CON M10 10 MEQ tablet TAKE 2 TABLETS BY MOUTH EVERY DAY 180 tablet 1  . loratadine (CLARITIN) 10 MG tablet Take 10 mg by mouth daily.    . Melatonin 10 MG TABS Take 1 tablet by mouth at bedtime.    . metolazone (ZAROXOLYN) 2.5 MG tablet Take 1 tablet (2.5 mg total) by mouth daily as needed. 1/2 hour before furosemide for excessive swelling in legs 30 tablet 6  . metoprolol (LOPRESSOR) 50 MG tablet Take 1 tablet (50 mg total) by mouth 2 (two) times daily. 180 tablet 3  . Multiple Vitamin (MULTIVITAMIN WITH MINERALS) TABS Take 1 tablet by mouth daily. Centrum Silver    . simvastatin (ZOCOR) 20 MG tablet TAKE 1 TABLET BY MOUTH AT BEDTIME. 90 tablet 3  . tamsulosin (FLOMAX) 0.4 MG CAPS capsule TAKE 1 CAPSULE (0.4 MG TOTAL) BY MOUTH DAILY. 90 capsule 3  . traMADol (ULTRAM) 50 MG tablet Take 1 tablet (50 mg total) by mouth 3 (three) times daily as needed. 90 tablet 0   No current facility-administered medications for this visit.    History   Social History  . Marital Status: Widowed    Spouse Name: N/A  . Number of Children: 3  . Years of Education: N/A   Occupational History  . retired Architect    Social History Main Topics  . Smoking status: Former Smoker    Quit date: 02/04/1963  . Smokeless tobacco: Never Used  . Alcohol Use: No  . Drug Use: No  . Sexual Activity: Not Currently   Other Topics Concern  . Not on file   Social History Narrative   No living will. Daughter Lee Gutierrez to make decisions.     Would want resuscitation attempts but no prolonged artificial ventilation.   Not  sure about tube feedings    Family History  Problem Relation Age of Onset  . Hypertension Father   . COPD Brother   . Diabetes Neg Hx   . Cancer Neg Hx    Socially he is recently widowed and has 3 children 7 grandchildren. He is originally from the [place] area. He now lives in [place]. There is no tobacco or alcohol use.  ROS General: Negative; No fevers, chills, or night sweats;  HEENT: Negative; No  changes in vision or hearing, sinus congestion, difficulty swallowing Pulmonary: Negative; No cough, wheezing, shortness of breath, hemoptysis Cardiovascular: Negative; No chest pain, presyncope, syncope, palpitations GI: Negative; No nausea, vomiting, diarrhea, or abdominal pain GU: Negative; No dysuria, hematuria, or difficulty voiding Musculoskeletal: Negative; no myalgias, joint pain, or weakness Hematologic/Oncology: Negative; no easy bruising, bleeding Endocrine: Negative; no heat/cold intolerance; no diabetes Neuro: Negative; no changes in balance, headaches Skin: Negative; No rashes or skin lesions Psychiatric: Negative; No behavioral problems, depression Sleep: Negative; No snoring, daytime sleepiness, hypersomnolence, bruxism, restless legs, hypnogognic hallucinations, no cataplexy Other comprehensive 14 point system review is negative.  PE BP 120/78 mmHg  Pulse 57  Ht '5\' 11"'  (1.803 m)  Wt 233 lb 9.6 oz (105.96 kg)  BMI 32.59 kg/m2  General: Alert, oriented, no distress.  Skin: normal turgor, no rashes HEENT: Normocephalic, atraumatic. Pupils round and reactive; sclera anicteric;no lid lag.  Nose without nasal septal hypertrophy; no xanthelasmas Mouth/Parynx benign; Mallinpatti scal 3 Neck: No JVD, no carotid bruits Lungs: clear to ausculatation and percussion; no wheezing or rales Chest wall: Nontender to palpation Heart: Irregularly irregular with a controlled ventricular response approximately 60, s1 s2 normal 1/6 systolic murmur; no diastolic murmur;  occasional ectopic, Abdomen: Moderately large diastases recti; soft, nontender; no hepatosplenomehaly, BS+; abdominal aorta nontender and not dilated by palpation. Back: No CVA Pulses 2+ Extremities: Mild edema above his sock line;no clubbing cyanosis, Homan's sign negative  Neurologic: grossly nonfocal Psychological: Normal affect and mood  ECG (independently read by me): Atrial fibrillation with a controlled trickle rate of 57 bpm.  QTc interval normal at 4:30 milliseconds.  September 2015 ECG (independently read by me): Atrial fibrillation at 62 beats per minute.  Occasional PVCs with left bundle branch morphology.  Prior March 2015 ECG (independently read by me): Atrial fibrillation with a controlled ventricular response in the 60s. Nonspecific ST changes.  Prior ECG: Atrial fibrillation with a controlled ventricular response in the mid to upper 50s  LABS:  BMET  BMP Latest Ref Rng 02/10/2014 09/05/2013 03/21/2013  Glucose 65 - 99 mg/dL 88 - 69(L)  BUN 8 - 27 mg/dL '19 23 19  ' Creatinine 0.76 - 1.27 mg/dL 0.93 0.77 0.8  BUN/Creat Ratio 10 - 22 20 - -  Sodium 134 - 144 mmol/L 138 - 136  Potassium 3.5 - 5.2 mmol/L 3.7 - 4.0  Chloride 97 - 108 mmol/L 97 - 101  CO2 18 - 29 mmol/L 26 - 29  Calcium 8.6 - 10.2 mg/dL 9.7 - 9.5     Hepatic Function Panel   Hepatic Function Latest Ref Rng 02/10/2014 03/21/2013 09/17/2011  Total Protein 6.0 - 8.5 g/dL 6.1 6.3 6.5  Albumin 3.5 - 5.2 g/dL - 3.7 3.8  AST 0 - 40 IU/L '24 24 27  ' ALT 0 - 44 IU/L '21 19 21  ' Alk Phosphatase 39 - 117 IU/L 78 64 75  Total Bilirubin 0.0 - 1.2 mg/dL 0.7 1.0 0.7  Bilirubin, Direct 0.0 - 0.3 mg/dL - 0.1 0.1     CBC  CBC Latest Ref Rng 02/10/2014 03/21/2013 04/12/2012  WBC 3.4 - 10.8 x10E3/uL 5.0 5.7 5.0  Hemoglobin 12.6 - 17.7 g/dL 11.8(L) 11.8(L) 11.9(L)  Hematocrit 37.5 - 51.0 % 35.4(L) 35.4(L) 35.2(L)  Platelets 150 - 379 x10E3/uL 167 140.0(L) 176     BNP    Component Value Date/Time   PROBNP 1273.0*  04/01/2011 0949    Lipid Panel     Component Value Date/Time   CHOL 120 02/10/2014  0820   CHOL 113 03/21/2013 1009   TRIG 56 02/10/2014 0820   HDL 63 02/10/2014 0820   HDL 60.10 03/21/2013 1009   CHOLHDL 1.9 02/10/2014 0820   CHOLHDL 2 03/21/2013 1009   VLDL 10.0 03/21/2013 1009   LDLCALC 46 02/10/2014 0820   LDLCALC 43 03/21/2013 1009     RADIOLOGY: No results found.    ASSESSMENT AND PLAN:  Lee Gutierrez is an 81 years old WM with along-standing type III aortic dissection which is continuously follow by periodic CT imaging, originally while he lived in [place] and more recently followed by Dr. Cyndia Gutierrez. He is now 5 years status post CABG surgery. His last nuclear perfusion study in August 2013  showed fairly normal perfusion. Presently he is doing well without angina. He has permanent atrial fibrillation his ventricular rate is well controlled with metoprolol and Lanoxin therapy.  He is unaware of any ventricular ectopy.  He denies presyncope or syncope. His blood pressure today is stable at 120/78.  Presently, he is well compensated.  There are no signs of CHF.  He's not having any anginal symptoms with reference to his CAD.  He does note trace edema above the sock line.  I have suggested support stockings.  He has not required use of metolazone.  His renal function is stable on his current therapy and is tolerating ace inhibition periods.  Lipids are excellent with an LDL at 46 on his most recent lipid study on his current dose of simvastatin 20 mg.  Dr. Annamarie Major will be checking laboratory in [place].  He will have a follow-up office visit with Dr. Cyndia Gutierrez.  At the end of next month and is scheduled to have a follow-up CT scan to further evaluate his aortic dissection.  I will see him in October 2016 for follow-up evaluation.  Time spent: 25 minutes  Troy Sine, MD, [facility]  08/02/2014 8:23 PM

## 2014-08-09 ENCOUNTER — Encounter: Payer: Medicare Other | Admitting: Internal Medicine

## 2014-08-16 ENCOUNTER — Other Ambulatory Visit: Payer: Self-pay | Admitting: Internal Medicine

## 2014-08-16 NOTE — Telephone Encounter (Signed)
Approved: #[id number] x [id number] 

## 2014-08-16 NOTE — Telephone Encounter (Signed)
Called to [facility] [address] [place]. 

## 2014-08-16 NOTE — Telephone Encounter (Signed)
05/15/14 

## 2014-09-05 ENCOUNTER — Other Ambulatory Visit: Payer: Self-pay | Admitting: Cardiovascular Disease

## 2014-09-05 NOTE — Telephone Encounter (Signed)
Rx refill sent to patient pharmacy   

## 2014-09-06 ENCOUNTER — Ambulatory Visit
Admission: RE | Admit: 2014-09-06 | Discharge: 2014-09-06 | Disposition: A | Discharge status: Home / Self Care | Payer: Medicare Other | Source: Ambulatory Visit | Attending: Surgery | Admitting: Surgery

## 2014-09-06 ENCOUNTER — Encounter: Payer: Self-pay | Admitting: Surgery

## 2014-09-06 ENCOUNTER — Ambulatory Visit (INDEPENDENT_AMBULATORY_CARE_PROVIDER_SITE_OTHER): Payer: Medicare Other | Admitting: Surgery

## 2014-09-06 VITALS — BP 121/76 | HR 77 | Resp 20 | Ht 71.0 in | Wt 233.0 lb

## 2014-09-06 DIAGNOSIS — I712 Thoracic aortic aneurysm, without rupture, unspecified: Secondary | ICD-10-CM

## 2014-09-06 DIAGNOSIS — I2510 Atherosclerotic heart disease of native coronary artery without angina pectoris: Secondary | ICD-10-CM | POA: Diagnosis not present

## 2014-09-06 MED ORDER — IOPAMIDOL (ISOVUE-370) INJECTION 76%
75.0000 mL | Freq: Once | INTRAVENOUS | Status: AC | PRN
  Administered 2014-09-06: 75 mL via INTRAVENOUS

## 2014-09-07 ENCOUNTER — Encounter: Payer: Self-pay | Admitting: Surgery

## 2014-09-07 NOTE — Progress Notes (Signed)
HPI:  The patient returns to my office today for followup of an ascending, arch, and descending thoracoabdominal aneurysm with a limited, chronic,type B aortic dissection. He underwent CABG by me in 2011. I last saw him on 09/09/2013 at which time a CT angiogram of the chest and abdomen showed stable aneurysmal dilatation of the aorta. Over the past year he denies any chest or back pain. 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 He underwent fusion of his left ankle on 04/22/2012 and is better but still limited by his right ankle.   Current Outpatient Prescriptions  Medication Sig Dispense Refill  . amLODipine (NORVASC) 5 MG tablet Take 1 tablet (5 mg total) by mouth daily. 90 tablet 4  . amoxicillin (AMOXIL) 500 MG capsule Take 2,000 mg by mouth as needed. Prior to dental appointment    . aspirin 81 MG tablet Take 81 mg by mouth daily.    . Cholecalciferol (VITAMIN D) 2000 UNITS tablet Take 2,000 Units by mouth daily.    . digoxin (LANOXIN) 0.125 MG tablet Take 0.5 tablets (0.0625 mg total) by mouth daily. 90 tablet 1  . enalapril (VASOTEC) 20 MG tablet TAKE 1 TABLET TWICE A DAY (THIS REPLACES THE AMLODIPINE) 180 tablet 3  . furosemide (LASIX) 40 MG tablet TAKE 2 TABLETS (80 MG TOTAL) BY MOUTH DAILY. 180 tablet 2  . KLOR-CON M10 10 MEQ tablet TAKE 2 TABLETS BY MOUTH EVERY DAY 180 tablet 2  . loratadine (CLARITIN) 10 MG tablet Take 10 mg by mouth daily.    . Melatonin 10 MG TABS Take 1 tablet by mouth at bedtime.    . metolazone (ZAROXOLYN) 2.5 MG tablet Take 1 tablet (2.5 mg total) by mouth daily as needed. 1/2 hour before furosemide for excessive swelling in legs 30 tablet 6  . metoprolol (LOPRESSOR) 50 MG tablet Take 1 tablet (50 mg total) by mouth 2 (two) times daily. 180 tablet 3  . Multiple Vitamin (MULTIVITAMIN WITH MINERALS) TABS Take 1 tablet by mouth daily. Centrum Silver    . simvastatin (ZOCOR) 20 MG tablet TAKE  1 TABLET BY MOUTH AT BEDTIME. 90 tablet 3  . tamsulosin (FLOMAX) 0.4 MG CAPS capsule TAKE 1 CAPSULE (0.4 MG TOTAL) BY MOUTH DAILY. 90 capsule 3  . traMADol (ULTRAM) 50 MG tablet TAKE 1 TABLET 3 TIMES A DAY AS NEEDED 90 tablet 0   No current facility-administered medications for this visit.     Physical Exam:  BP 121/76 mmHg  Pulse 77  Resp 20  Ht 5\' 11"  (1.803 m)  Wt 233 lb (105.688 kg)  BMI 32.51 kg/m2  SpO2 94% He looks well but elderly and frail. Cardiac exam shows a regular rate and rhythm. There is a 1/6 systolic murmur. There is no diastolic murmur.  Lungs are clear.  Diagnostic Tests:  CLINICAL DATA: History of aneurysmal disease of the thoracic aorta with chronic focal dissection of the proximal descending thoracic aorta.  EXAM: CT ANGIOGRAPHY CHEST WITH CONTRAST  TECHNIQUE: Multidetector CT imaging of the chest was performed using the standard protocol during bolus administration of intravenous contrast. Multiplanar CT image reconstructions and MIPs were obtained to evaluate the vascular anatomy.  CONTRAST: 75 mL Isovue 370 IV  COMPARISON: 09/07/2013, 09/08/2012, 09/02/2011 and 04/16/2010  FINDINGS: The ascending thoracic aorta shows stable dilatation with maximal diameter of 4.9 cm. Maximal diameter at the level of the sinuses of Valsalva is approximately 4.5 cm. The proximal  arch measures 4.3 cm. The distal arch measures 4.4 cm. These are relatively stable measurements.  The proximal descending thoracic aorta again demonstrates focal evidence of probable chronic dissection resulting in focal aneurysmal outpouching and a small component of mural thrombus. Immediately inferior to the focal chronic dissection is some evidence of progressive dilatation of the proximal descending thoracic aorta. This now measures approximately 5.9 cm in greatest diameter compared to approximately 5.7 cm at a comparable level on the prior study. In 2011, this  segment measured approximately 5.4 cm. The distal descending thoracic aorta measures approximately 5.4 cm and may be mildly increased compared to a palpable measurement of approximately 5.2 cm on the previous study. No evidence of acute dissection. No hemorrhage is seen surrounding the aortic.  Proximal great vessels show stable and normal patency. The patient has had prior CABG. No pleural or pericardial fluid is identified. A number of small mediastinal lymph nodes are again visualized which are unchanged in appearance. There is stable cardiac enlargement. No nodules or masses identified.  Review of the MIP images confirms the above findings.  IMPRESSION: It is felt that the aneurysmal dilatation of the descending thoracic aorta immediately inferior to a focal chronic dissection has mildly increased since the prior study and especially since 2011. Since the prior study, increased measurement represents approximately 0.2 cm growth. This segment of the aorta may be as much as 0.5 cm greater in diameter compared to the 2011 study. This appears to represent very slow rate of expansion. The ascending thoracic aorta and aortic arch shows stable aneurysmal disease. No evidence of acute aortic dissection.   Electronically Signed  By: Aletta Edouard M.D.  On: 09/06/2014 14:14   Impression:  There has been very slow expansion of the aorta just inferior to the area of focal chronic dissection in the proximal to mid descending aorta. The diameter is 5.9 cm now and was 5.7 cm last year. It has enlarged 5 mm since 2011 which is a relatively small amount. He is not a candidate for open surgical repair and I think the aorta proximally and distally is probably too large to allow stent graft deployment with the currently available stent grafts. Since he is 57 and fairly frail I think continued follow up and good blood pressure control would be best at this time. I reviewed the scans with  the patient and his son and answered all of their questions.  Plan:  I will repeat the CTA in 1 year and will see him back at that time.   Gaye Pollack, MD [facility] [phone]

## 2014-09-18 ENCOUNTER — Telehealth: Payer: Self-pay | Admitting: Cardiovascular Disease

## 2014-09-18 NOTE — Telephone Encounter (Addendum)
Spoke to caller. She reports patient weight up to 241lbs, he is short of breath, swelling in abdomen and ankles.  Noted furosemide dose - recommended metolazone, then extra 40mg  lasix tablet this afternoon, would route to Dr. Claiborne Billings for further instructions.  Will also send staff message to [facility] st to see if patient can be added for a same-day or next-day appt, per caller request.

## 2014-09-18 NOTE — Telephone Encounter (Signed)
[name] is calling because Mr. Lee Gutierrez is having swelling in ankles and legs are hard . Now the swelling is in his lower abdomen. He took the [employer] and lasik . Plefase call    Thanks

## 2014-09-18 NOTE — Telephone Encounter (Signed)
She wants to know if you have resolved anything for this afternoon?

## 2014-09-18 NOTE — Telephone Encounter (Addendum)
Pt's daughter had called back twice and was awaiting instruction - earlier I had advised extra dose of metolazone, lasix today - pt took, seems to be doing better. Advised repeat of this tomorrow, take extra potassium pill as well. Recheck weight tomorrow AM, Wednesday Am.  In the meantime, have sent message to scheduling requesting same-day/next-day add-in for PA visit if possible - per caller's request. Advised if pt feeling worse, getting short of breath, not improving fluid elimination w/ med chgs - seek ER eval as needed d/t recent rapid gain -> may warrant IV diuretic intervention.  Will defer to Dr. Claiborne Billings for additional advice. Check BMET? Continue increased lasix dosing?

## 2014-09-19 ENCOUNTER — Ambulatory Visit (INDEPENDENT_AMBULATORY_CARE_PROVIDER_SITE_OTHER): Payer: Medicare Other | Admitting: Physician Assistant

## 2014-09-19 ENCOUNTER — Encounter: Payer: Self-pay | Admitting: Physician Assistant

## 2014-09-19 VITALS — BP 109/60 | HR 61 | Ht 71.0 in | Wt 235.0 lb

## 2014-09-19 DIAGNOSIS — I255 Ischemic cardiomyopathy: Secondary | ICD-10-CM

## 2014-09-19 DIAGNOSIS — I5043 Acute on chronic combined systolic (congestive) and diastolic (congestive) heart failure: Secondary | ICD-10-CM

## 2014-09-19 DIAGNOSIS — I482 Chronic atrial fibrillation, unspecified: Secondary | ICD-10-CM

## 2014-09-19 DIAGNOSIS — I10 Essential (primary) hypertension: Secondary | ICD-10-CM | POA: Diagnosis not present

## 2014-09-19 DIAGNOSIS — E785 Hyperlipidemia, unspecified: Secondary | ICD-10-CM

## 2014-09-19 DIAGNOSIS — I340 Nonrheumatic mitral (valve) insufficiency: Secondary | ICD-10-CM

## 2014-09-19 DIAGNOSIS — I2510 Atherosclerotic heart disease of native coronary artery without angina pectoris: Secondary | ICD-10-CM | POA: Diagnosis not present

## 2014-09-19 DIAGNOSIS — I712 Thoracic aortic aneurysm, without rupture, unspecified: Secondary | ICD-10-CM

## 2014-09-19 NOTE — Patient Instructions (Signed)
Medication Instructions:  1. TAKE EXTRA LASIX 40 MG TODAY 5/10 AND TOMORROW 09/20/14; THEN RESUME REGULAR DOSE OF LASIX   2. TAKE EXTRA POTASSIUM 10 MEQ TODAY AND TOMORROW WITH THE EXTRA LASIX  Labwork: TODAY BMET, BNP  1 WEEK REPEAT BMET TO BE DONE AT THE [facility] WHERE YOU SEE DR. KELLY  Testing/Procedures: NONE  Follow-Up: 1 MONTH WITH DR. Claiborne Billings  Any Other Special Instructions Will Be Listed Below (If Applicable).

## 2014-09-19 NOTE — Progress Notes (Signed)
Cardiology Office Note   Date:  09/19/2014   ID:  MYRON STANKOVICH, DOB 05-May-1931, MRN [id number]  PCP:  Viviana Simpler, MD  Cardiologist:  Dr. Shelva Majestic     Chief Complaint  Patient presents with  . Leg Swelling  . Shortness of Breath     History of Present Illness: [facility] is a 81 y.o. male with a hx of type III aortic dissection extending into his abdominal aorta (followed by Dr. Cyndia Bent), CAD status post CABG in 2011 (LIMA-LAD, SVG-OM, SVG-D2), ischemic CM, combined systolic and diastolic CHF, chronic atrial fibrillation (on aspirin instead of full dose anticoagulation secondary to aortic dissection), HTN, HL.  Last seen by Dr. Shelva Majestic 08/02/14.  He is followed by Dr. Cyndia Bent for his aortic dissection and descending thoracic aorta.  Last seen 09/06/14.    He was out of town recently with his son.  He was eating out a lot and likely had extra salt.  His daughter noticed he was slurring his speech over the last 1-2 days.  He noted increased DOE (NYHA 3) as well as increased LE edema.  His weight was up 11 lbs.  He called the office yesterday and took an extra Lasix and 2 metolazone. He also took one metolazone today.  His weight is down at home to baseline.  His LE edema is somewhat better. He denies chest pain, orthopnea, PND.  Breathing is better today. No syncope.     Studies/Reports Reviewed Today:  Chest CTA 09/06/14 IMPRESSION: It is felt that the aneurysmal dilatation of the descending thoracic aorta immediately inferior to a focal chronic dissection has mildly increased since the prior study and especially since 2011. Since the prior study, increased measurement represents approximately 0.2 cm growth. This segment of the aorta may be as much as 0.5 cm greater in diameter compared to the 2011 study. This appears to represent very slow rate of expansion. The ascending thoracic aorta and aortic arch shows stable aneurysmal disease. No evidence of acute  aortic dissection.  Lexiscan Myoview 01/07/12 Diaphragmatic attenuation No ischemia, no scar, low risk; EF not gated  Echo 01/07/12 Moderate LVH, moderate inferior HK, EF 82-50%, grade 3 diastolic dysfunction, mild aortic root dilatation Mild RVE, mildly reduced RVSF Severe BAE Moderate MR, moderate TR, RVSP >60 Mild AI Mild PI    Past Medical History  Diagnosis Date  . Hypertension   . Arthritis   . Coronary artery disease   . 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 5 x 4.9, proximal descending thoracic aortic aorta measuring 5.8 x 5.1, the aortic 5.6 x 5.5 and distal thoracic aorta 3.9 x 4.1, His infrarenal aneurysm measured 3 x 2.7.  . Shortness of breath   . Aortic dissection   . Dysrhythmia     atrial fib  . Blood transfusion   . CHF, acute on chronic 03/24/2011  . HOH (hard of hearing)     bilateral hearing aids  . Hx of echocardiogram 12/2011    EF 40% showed severe LA dilation, He had moderate concentric LHV. He has moderate inferior wall hypokinesis. There was evidence for pulmonary hypertension with an estimated RV systolic pressure at 62. He had moderate TR  . History of stress test 12/2011    Showed mild inferior thinning felt most likely due to attenuation artifact.    Past Surgical History  Procedure Laterality Date  . Replacement total knee      bilaterally  .  Joint replacement      Bilateral knee  . Tonsillectomy    . Hernia repair    . Cardiac catheterization    . Coronary artery bypass graft  Feb 2011  . Ankle fusion  04/22/2012    Procedure: ARTHRODESIS ANKLE;  Surgeon: Wylene Simmer, MD;  Location: [facility];  Service: Orthopedics;  Laterality: Left;  Left Ankle and Subtalar Arthrodesis      Current Outpatient Prescriptions  Medication Sig Dispense Refill  . amLODipine (NORVASC) 5 MG tablet Take 1 tablet (5 mg total) by mouth daily. 90 tablet 4  . amoxicillin (AMOXIL) 500 MG capsule Take 2,000 mg by mouth as needed.  Prior to dental appointment    . aspirin 81 MG tablet Take 81 mg by mouth daily.    . Cholecalciferol (VITAMIN D) 2000 UNITS tablet Take 2,000 Units by mouth daily.    . digoxin (LANOXIN) 0.125 MG tablet Take 0.5 tablets (0.0625 mg total) by mouth daily. 90 tablet 1  . enalapril (VASOTEC) 20 MG tablet TAKE 1 TABLET TWICE A DAY (THIS REPLACES THE AMLODIPINE) 180 tablet 3  . furosemide (LASIX) 40 MG tablet TAKE 2 TABLETS (80 MG TOTAL) BY MOUTH DAILY. 180 tablet 2  . KLOR-CON M10 10 MEQ tablet TAKE 2 TABLETS BY MOUTH EVERY DAY 180 tablet 2  . loratadine (CLARITIN) 10 MG tablet Take 10 mg by mouth daily.    . Melatonin 10 MG TABS Take 1 tablet by mouth at bedtime.    . metolazone (ZAROXOLYN) 2.5 MG tablet Take 1 tablet (2.5 mg total) by mouth daily as needed. 1/2 hour before furosemide for excessive swelling in legs 30 tablet 6  . metoprolol (LOPRESSOR) 50 MG tablet Take 1 tablet (50 mg total) by mouth 2 (two) times daily. 180 tablet 3  . Multiple Vitamin (MULTIVITAMIN WITH MINERALS) TABS Take 1 tablet by mouth daily. Centrum Silver    . simvastatin (ZOCOR) 20 MG tablet TAKE 1 TABLET BY MOUTH AT BEDTIME. 90 tablet 3  . tamsulosin (FLOMAX) 0.4 MG CAPS capsule TAKE 1 CAPSULE (0.4 MG TOTAL) BY MOUTH DAILY. 90 capsule 3  . traMADol (ULTRAM) 50 MG tablet TAKE 1 TABLET 3 TIMES A DAY AS NEEDED 90 tablet 0   No current facility-administered medications for this visit.    Allergies:   Review of patient's allergies indicates no known allergies.    Social History:  The patient  reports that he quit smoking about 51 years ago. He has never used smokeless tobacco. He reports that he does not drink alcohol or use illicit drugs.   Family History:  The patient's family history includes COPD in his brother; Hypertension in his father. There is no history of Diabetes, Cancer, Heart attack, or Stroke.    ROS:   Please see the history of present illness.   ROS    PHYSICAL EXAM: VS:  BP 109/60 mmHg  Pulse  61  Ht 5\' 11"  (1.803 m)  Wt 235 lb (106.595 kg)  BMI 32.79 kg/m2    Wt Readings from Last 3 Encounters:  09/19/14 235 lb (106.595 kg)  09/06/14 233 lb (105.688 kg)  07/31/14 233 lb 9.6 oz (105.96 kg)     GEN: Well nourished, well developed, in no acute distress HEENT: normal Neck: minimally elevated JVD,  no masses Cardiac:  Normal S1/S2, irreg irreg rhythm; 2/6 holosystolic murmur LSB,  no rubs or gallops, 1+ bilateral LE edema  Respiratory:  Decreased breath sounds bilaterally, no wheezing, rhonchi or rales. GI:  soft, nontender, nondistended, + BS MS: no deformity or atrophy Skin: warm and dry  Neuro:  CNs II-XII intact, Strength and sensation are intact Psych: Normal affect   EKG:  EKG is ordered today.  It demonstrates:   AFib, HR 61, IVCD, no change from prior tracing.   Recent Labs: 02/10/2014: ALT 21; BUN 19; Creatinine 0.93; Hemoglobin 11.8*; Platelets 167; Potassium 3.7; Sodium 138; TSH 1.730    Lipid Panel    Component Value Date/Time   CHOL 120 02/10/2014 0820   CHOL 113 03/21/2013 1009   TRIG 56 02/10/2014 0820   HDL 63 02/10/2014 0820   HDL 60.10 03/21/2013 1009   CHOLHDL 1.9 02/10/2014 0820   CHOLHDL 2 03/21/2013 1009   VLDL 10.0 03/21/2013 1009   LDLCALC 46 02/10/2014 0820   LDLCALC 43 03/21/2013 1009      ASSESSMENT AND PLAN:  Acute on chronic combined systolic and diastolic HF (heart failure) I suspect this was related to high salt diet while he was out of town recently.  Volume is improved with extra diuretics yesterday.  I do not think he is quite back to baseline just yet.  He is NYHA 2b-3.  -  Take extra dose of Lasix 40 mg today and tomorrow.  -  Take extra dose of K+ 10 mEq today and tomorrow.   -  BMET, BNP today.  Check BMET 1 week.   Ischemic cardiomyopathy Continue Digoxin, ACE inhibitor, beta-blocker.  Coronary artery disease involving native coronary artery of native heart without angina pectoris No angina.  Continue ASA, statin,  ACE inhibitor, beta-blocker.  Essential hypertension Controlled.   Hyperlipidemia Continue statin.  Chronic atrial fibrillation Rate controlled.  He is not a candidate for anticoagulation.   Mitral Regurgitation Mod by last echo in 2013.  I do not think he is a surgical candidate.  Will defer when/if to repeat echo to Dr. Horace Porteous discretion.   Aortic aneurysm, thoracic  FU with Dr. Cyndia Bent as planned.    Current medicines are reviewed at length with the patient today.  Concerns regarding medicines are as outlined above.  The following changes have been made:    Take extra Lasix 40 mg QD today and tomorrow.  Take extra K+ 10 mEq today and tomorrow.    Labs/ tests ordered today include:  Orders Placed This Encounter  Procedures  . Basic Metabolic Panel (BMET)  . B Nat Peptide  . Basic Metabolic Panel (BMET)  . EKG 12-Lead    Disposition:   FU with Dr. Shelva Majestic 1 month.    Signed, Versie Starks, MHS 09/19/2014 5:00 PM    [address], [place], [place]  [address] Phone: [phone]; Fax: [phone]

## 2014-09-19 NOTE — Telephone Encounter (Signed)
Attempted to call [name] and patient - had to leave message.   Patient was scheduled per [facility] 09/19/14 @ 330pm - S. Kathlen Mody, PA - [employer]

## 2014-09-19 NOTE — Telephone Encounter (Signed)
Patient scheduled for OV with S. Weaver 09/19/14 @ 330pm Spoke with son-in-law and communicated appointment date/time/location.

## 2014-09-20 ENCOUNTER — Telehealth: Payer: Self-pay | Admitting: Cardiology

## 2014-09-20 ENCOUNTER — Other Ambulatory Visit: Payer: Self-pay | Admitting: *Deleted

## 2014-09-20 ENCOUNTER — Telehealth: Payer: Self-pay | Admitting: *Deleted

## 2014-09-20 DIAGNOSIS — I5043 Acute on chronic combined systolic (congestive) and diastolic (congestive) heart failure: Secondary | ICD-10-CM

## 2014-09-20 LAB — BASIC METABOLIC PANEL
BUN: 29 mg/dL — ABNORMAL HIGH (ref 6–23)
CO2: 31 mEq/L (ref 19–32)
Calcium: 9.5 mg/dL (ref 8.4–10.5)
Chloride: 96 mEq/L (ref 96–112)
Creatinine, Ser: 1.26 mg/dL (ref 0.40–1.50)
GFR: 58.25 mL/min — AB (ref >60.00)
Glucose, Bld: 97 mg/dL (ref 70–99)
POTASSIUM: 3.5 meq/L (ref 3.5–5.1)
Sodium: 134 mEq/L — ABNORMAL LOW (ref 135–145)

## 2014-09-20 LAB — BRAIN NATRIURETIC PEPTIDE: Pro B Natriuretic peptide (BNP): 527 pg/mL — ABNORMAL HIGH (ref 0.0–100.0)

## 2014-09-20 NOTE — Telephone Encounter (Signed)
Follow up        Pt daughter returning nurse call

## 2014-09-20 NOTE — Telephone Encounter (Signed)
lmptcb to go over lab results 

## 2014-09-20 NOTE — Telephone Encounter (Signed)
S/w pt's daughter [place] [facility] about lab results and to hold the extra lasix today. [place] [facility] states [facility] office just called her as well and went over the results as well. I apologized for duplicate call, [place] [facility] said no problem. Advised to monitor weight and call if wt up 3 lb's or more in 1 day or more sob or edema. [facility] agreeable to plan of care and verbalized understanding by phone to instructions.

## 2014-09-20 NOTE — Telephone Encounter (Signed)
I notified [place] [facility] to resume regular dose of Lasix today instead of tomorrow.  Also they will call if wt begins to rise again.  He did loose some fluid yesterday and this AM. She had all questions answered.

## 2014-09-20 NOTE — Telephone Encounter (Signed)
S/w pt's daughter [name] about lab results and to hold the extra lasix today. [name] states [facility] office just called her as well and went over the results as well. I apologized for duplicate call, heidi said no problem. Advised to monitor weight and call if wt up 3 lb's or more in 1 day or more sob or edema. [name] agreeable to plan of care and verbalized understanding by phone to instructions.

## 2014-09-26 ENCOUNTER — Other Ambulatory Visit: Payer: Self-pay | Admitting: Physician Assistant

## 2014-09-27 ENCOUNTER — Encounter: Payer: Self-pay | Admitting: Physician Assistant

## 2014-09-27 LAB — BASIC METABOLIC PANEL
BUN/Creatinine Ratio: 28 — ABNORMAL HIGH (ref 10–22)
BUN: 28 mg/dL — ABNORMAL HIGH (ref 8–27)
CALCIUM: 9.6 mg/dL (ref 8.6–10.2)
CHLORIDE: 97 mmol/L (ref 97–108)
CO2: 27 mmol/L (ref 18–29)
Creatinine, Ser: 1 mg/dL (ref 0.76–1.27)
GFR calc Af Amer: 81 mL/min/{1.73_m2} (ref >59)
GFR, EST NON AFRICAN AMERICAN: 70 mL/min/{1.73_m2} (ref >59)
GLUCOSE: 87 mg/dL (ref 65–99)
POTASSIUM: 3.9 mmol/L (ref 3.5–5.2)
Sodium: 138 mmol/L (ref 134–144)

## 2014-09-28 ENCOUNTER — Telehealth: Payer: Self-pay | Admitting: Physician Assistant

## 2014-09-28 NOTE — Telephone Encounter (Signed)
New message ° ° ° ° ° °Returning Carol's call ° ° ° ° ° °

## 2014-09-28 NOTE — Telephone Encounter (Signed)
S/w pt's daughter [name] per request of pt. [name] aware of lab results with verbal understanding by phone.

## 2014-10-19 ENCOUNTER — Encounter: Payer: Self-pay | Admitting: Cardiovascular Disease

## 2014-10-19 ENCOUNTER — Ambulatory Visit: Payer: Medicare Other | Admitting: Cardiovascular Disease

## 2014-10-19 ENCOUNTER — Ambulatory Visit (INDEPENDENT_AMBULATORY_CARE_PROVIDER_SITE_OTHER): Payer: Medicare Other | Admitting: Cardiovascular Disease

## 2014-10-19 VITALS — BP 132/84 | HR 64 | Ht 70.0 in | Wt 226.7 lb

## 2014-10-19 DIAGNOSIS — I7100 Dissection of unspecified site of aorta: Secondary | ICD-10-CM | POA: Diagnosis not present

## 2014-10-19 DIAGNOSIS — I255 Ischemic cardiomyopathy: Secondary | ICD-10-CM

## 2014-10-19 DIAGNOSIS — E785 Hyperlipidemia, unspecified: Secondary | ICD-10-CM

## 2014-10-19 DIAGNOSIS — I5041 Acute combined systolic (congestive) and diastolic (congestive) heart failure: Secondary | ICD-10-CM

## 2014-10-19 DIAGNOSIS — I2510 Atherosclerotic heart disease of native coronary artery without angina pectoris: Secondary | ICD-10-CM | POA: Diagnosis not present

## 2014-10-19 DIAGNOSIS — I482 Chronic atrial fibrillation, unspecified: Secondary | ICD-10-CM

## 2014-10-19 NOTE — Patient Instructions (Addendum)
Your physician wants you to follow-up in: 6 months or sooner if needed. You will receive a reminder letter in the mail two months in advance. If you don't receive a letter, please call our office to schedule the follow-up appointment.  Your physician has recommended you make the following change in your medication: you may take a extra lasix if needed for swelling.

## 2014-10-21 ENCOUNTER — Encounter: Payer: Self-pay | Admitting: Cardiovascular Disease

## 2014-10-21 DIAGNOSIS — I5041 Acute combined systolic (congestive) and diastolic (congestive) heart failure: Secondary | ICD-10-CM | POA: Insufficient documentation

## 2014-10-21 DIAGNOSIS — E785 Hyperlipidemia, unspecified: Secondary | ICD-10-CM | POA: Insufficient documentation

## 2014-10-21 NOTE — Progress Notes (Signed)
Patient ID: [place] [facility], male   DOB: February 05, 1931, 81 y.o.   MRN: [id number]    HPI: [place] [facility] is a 81 y.o. male who presents for a 3 month cardiology evaluation.  He is here with his son.  [place] [facility] has a long-standing history of type III aortic dissection extending into his abdominal aorta and is now followed by [place] [facility]. In February 2011 he underwent CABG surgery for severe CAD after catheterization by me revealed high grade left main stenosis. The LIMA was placed to the LAD, vein to the marginal, vein to the second diagonal. A CT scan in April 2013 showed a 5 x 4.9 proximal descending thoracic aneurysm in the descending thoracic aneurysm measured 5.8 x 5.1 and at the aortic arch 5.6 x 5.5 and the distal thoracic aorta at 3.9 x 4.1. In December 2013 he underwent left ankle surgery and tolerated this well from a cardiovascular standpoint. His wife was diagnosed with glioblastoma last year, and unfortunately  she died in 26-Feb-2013 at age 69.  He had been living by himself, but will be moving intermittent with his son.  He has history of permanent atrial fibrillation. Because of the aneurysm and type III aortic dissection is only on aspirin rather than full dose anticoagulation.  Since I last saw him, he underwent a one-year follow-up evaluation with [place] [facility] for his chronic type B aortic dissection.  His most recent CT scan showed his diameter had increased to 5.9 cm, which had increased from 5.7 cm, and has enlarged 5 mm since 2011, which was felt to be a relatively small amount.  He is felt not to be a candidate for open surgical repair by Dr. and that the aorta proximally and distally is probably too large to allow staff stent graft placement with the currently available stent graft.  For this reason, continued medical therapy was recommended.  Since I last saw him in March, he had gained approximately 20 pounds of fluid one month ago after he had gone on vacation and undoubtedly had  increased sodium intake.  He was treated with increased Lasix and was evaluated by [place] [facility] in the office.  Subsequent, he has lost a lot of that weight.  He now has a hearing aid.  He presents for evaluation.  Past Medical History  Diagnosis Date  . Hypertension   . Arthritis   . Coronary artery disease   . 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 5 x 4.9, proximal descending thoracic aortic aorta measuring 5.8 x 5.1, the aortic 5.6 x 5.5 and distal thoracic aorta 3.9 x 4.1, His infrarenal aneurysm measured 3 x 2.7.  . Shortness of breath   . Aortic dissection   . Dysrhythmia     atrial fib  . Blood transfusion   . CHF, acute on chronic 03/24/2011  . HOH (hard of hearing)     bilateral hearing aids  . Hx of echocardiogram 12/2011    EF 40% showed severe LA dilation, He had moderate concentric LHV. He has moderate inferior wall hypokinesis. There was evidence for pulmonary hypertension with an estimated RV systolic pressure at 62. He had moderate TR  . History of stress test 12/2011    Showed mild inferior thinning felt most likely due to attenuation artifact.    Past Surgical History  Procedure Laterality Date  . Replacement total knee      bilaterally  . Joint replacement  Bilateral knee  . Tonsillectomy    . Hernia repair    . Cardiac catheterization    . Coronary artery bypass graft  Feb 2011  . Ankle fusion  04/22/2012    Procedure: ARTHRODESIS ANKLE;  Surgeon: [place] Simmer, MD;  Location: [facility];  Service: Orthopedics;  Laterality: Left;  Left Ankle and Subtalar Arthrodesis     No Known Allergies  Current Outpatient Prescriptions  Medication Sig Dispense Refill  . amLODipine (NORVASC) 5 MG tablet Take 1 tablet (5 mg total) by mouth daily. 90 tablet 4  . amoxicillin (AMOXIL) 500 MG capsule Take 2,000 mg by mouth as needed. Prior to dental appointment    . aspirin 81 MG tablet Take 81 mg by mouth daily.    . Cholecalciferol (VITAMIN  D) 2000 UNITS tablet Take 2,000 Units by mouth daily.    . digoxin (LANOXIN) 0.125 MG tablet Take 0.5 tablets (0.0625 mg total) by mouth daily. 90 tablet 1  . enalapril (VASOTEC) 20 MG tablet TAKE 1 TABLET TWICE A DAY (THIS REPLACES THE AMLODIPINE) 180 tablet 3  . furosemide (LASIX) 40 MG tablet TAKE 2 TABLETS (80 MG TOTAL) BY MOUTH DAILY. 180 tablet 2  . KLOR-CON M10 10 MEQ tablet TAKE 2 TABLETS BY MOUTH EVERY DAY 180 tablet 2  . loratadine (CLARITIN) 10 MG tablet Take 10 mg by mouth daily.    . Melatonin 10 MG TABS Take 1 tablet by mouth at bedtime.    . metolazone (ZAROXOLYN) 2.5 MG tablet Take 1 tablet (2.5 mg total) by mouth daily as needed. 1/2 hour before furosemide for excessive swelling in legs 30 tablet 6  . metoprolol (LOPRESSOR) 50 MG tablet Take 1 tablet (50 mg total) by mouth 2 (two) times daily. 180 tablet 3  . Multiple Vitamin (MULTIVITAMIN WITH MINERALS) TABS Take 1 tablet by mouth daily. Centrum Silver    . simvastatin (ZOCOR) 20 MG tablet TAKE 1 TABLET BY MOUTH AT BEDTIME. 90 tablet 3  . tamsulosin (FLOMAX) 0.4 MG CAPS capsule TAKE 1 CAPSULE (0.4 MG TOTAL) BY MOUTH DAILY. 90 capsule 3  . traMADol (ULTRAM) 50 MG tablet TAKE 1 TABLET 3 TIMES A DAY AS NEEDED 90 tablet 0   No current facility-administered medications for this visit.    History   Social History  . Marital Status: Widowed    Spouse Name: N/A  . Number of Children: 3  . Years of Education: N/A   Occupational History  . retired Architect    Social History Main Topics  . Smoking status: Former Smoker    Quit date: 02/04/1963  . Smokeless tobacco: Never Used  . Alcohol Use: No  . Drug Use: No  . Sexual Activity: Not Currently   Other Topics Concern  . Not on file   Social History Narrative   No living will. Daughter [place] Gutierrez to make decisions.     Would want resuscitation attempts but no prolonged artificial ventilation.   Not sure about tube feedings    Family History  Problem Relation Age of  Onset  . Hypertension Father   . COPD Brother   . Diabetes Neg Hx   . Cancer Neg Hx   . Heart attack Neg Hx   . Stroke Neg Hx    Socially he is recently widowed and has 3 children 7 grandchildren. He is originally from the [place] area. He now lives in [place]. There is no tobacco or alcohol use.  ROS General: Negative; No fevers, chills, or  night sweats;  HEENT: Negative; No changes in vision or hearing, sinus congestion, difficulty swallowing Pulmonary: Negative; No cough, wheezing, shortness of breath, hemoptysis Cardiovascular: See history of present illness GI: Negative; No nausea, vomiting, diarrhea, or abdominal pain GU: Negative; No dysuria, hematuria, or difficulty voiding Musculoskeletal: Negative; no myalgias, joint pain, or weakness Hematologic/Oncology: Negative; no easy bruising, bleeding Endocrine: Negative; no heat/cold intolerance; no diabetes Neuro: Negative; no changes in balance, headaches Skin: Negative; No rashes or skin lesions Psychiatric: Negative; No behavioral problems, depression Sleep: Negative; No snoring, daytime sleepiness, hypersomnolence, bruxism, restless legs, hypnogognic hallucinations, no cataplexy Other comprehensive 14 point system review is negative.  PE BP 132/84 mmHg  Pulse 64  Ht '5\' 10"'  (1.778 m)  Wt 102.83 kg (226 lb 11.2 oz)  BMI 32.53 kg/m2   Wt Readings from Last 3 Encounters:  10/19/14 102.83 kg (226 lb 11.2 oz)  09/19/14 106.595 kg (235 lb)  09/06/14 105.688 kg (233 lb)   General: Alert, oriented, no distress.  Skin: normal turgor, no rashes HEENT: Normocephalic, atraumatic. Pupils round and reactive; sclera anicteric;no lid lag.  Nose without nasal septal hypertrophy; no xanthelasmas Mouth/Parynx benign; Mallinpatti scale 3 Neck: No JVD, no carotid bruits Lungs: clear to ausculatation and percussion; no wheezing or rales Chest wall: Nontender to palpation Heart: Irregularly irregular with a controlled ventricular  response approximately 60, s1 s2 normal 1/6 systolic murmur; no diastolic murmur; occasional ectopic, Abdomen: Moderately large diastases recti; soft, nontender; no hepatosplenomehaly, BS+; abdominal aorta nontender and not dilated by palpation. Back: No CVA Pulses 2+ Extremities: Trivial edema above his sock line;no clubbing cyanosis, Homan's sign negative  Neurologic: grossly nonfocal Psychological: Normal affect and mood  ECG (independently read by me): Atrial fibrillation with ventricular rate at 64 bpm.  Occasional PVC.  ECG (independently read by me): Atrial fibrillation with a controlled trickle rate of 57 bpm.  QTc interval normal at 4:30 milliseconds.  September 2015 ECG (independently read by me): Atrial fibrillation at 62 beats per minute.  Occasional PVCs with left bundle branch morphology.  Prior March 2015 ECG (independently read by me): Atrial fibrillation with a controlled ventricular response in the 60s. Nonspecific ST changes.  Prior ECG: Atrial fibrillation with a controlled ventricular response in the mid to upper 50s  LABS:  BMET  BMP Latest Ref Rng 09/26/2014 09/19/2014 02/10/2014  Glucose 65 - 99 mg/dL 87 97 88  BUN 8 - 27 mg/dL 28(H) 29(H) 19  Creatinine 0.76 - 1.27 mg/dL 1.00 1.26 0.93  BUN/Creat Ratio 10 - 22 28(H) - 20  Sodium 134 - 144 mmol/L 138 134(L) 138  Potassium 3.5 - 5.2 mmol/L 3.9 3.5 3.7  Chloride 97 - 108 mmol/L 97 96 97  CO2 18 - 29 mmol/L '27 31 26  ' Calcium 8.6 - 10.2 mg/dL 9.6 9.5 9.7     Hepatic Function Panel   Hepatic Function Latest Ref Rng 02/10/2014 03/21/2013 09/17/2011  Total Protein 6.0 - 8.5 g/dL 6.1 6.3 6.5  Albumin 3.5 - 5.2 g/dL - 3.7 3.8  AST 0 - 40 IU/L '24 24 27  ' ALT 0 - 44 IU/L '21 19 21  ' Alk Phosphatase 39 - 117 IU/L 78 64 75  Total Bilirubin 0.0 - 1.2 mg/dL 0.7 1.0 0.7  Bilirubin, Direct 0.0 - 0.3 mg/dL - 0.1 0.1     CBC  CBC Latest Ref Rng 02/10/2014 03/21/2013 04/12/2012  WBC 3.4 - 10.8 x10E3/uL 5.0 5.7 5.0    Hemoglobin 12.6 - 17.7 g/dL 11.8(L) 11.8(L) 11.9(L)  Hematocrit 37.5 - 51.0 % 35.4(L) 35.4(L) 35.2(L)  Platelets 150 - 379 x10E3/uL 167 140.0(L) 176     BNP    Component Value Date/Time   PROBNP 527.0* 09/19/2014 1710    Lipid Panel     Component Value Date/Time   CHOL 120 02/10/2014 0820   CHOL 113 03/21/2013 1009   TRIG 56 02/10/2014 0820   HDL 63 02/10/2014 0820   HDL 60.10 03/21/2013 1009   CHOLHDL 1.9 02/10/2014 0820   CHOLHDL 2 03/21/2013 1009   VLDL 10.0 03/21/2013 1009   LDLCALC 46 02/10/2014 0820   LDLCALC 43 03/21/2013 1009     RADIOLOGY: No results found.    ASSESSMENT AND PLAN: Mr. Bell is an 81 years old WM with along-standing type III aortic dissection which is continuously follow by periodic CT imaging, originally while he lived in [place] and more recently followed by [place] [facility].  Most recent CT scan did show slight enlargement up to 5.9 cm, but he is not felt to be a good open surgical candidate and continued medical therapy approach was recommended.  During his recent evaluation with [place] [facility].  I reviewed his CT scan.  He is now 5 years status post CABG surgery. His last nuclear perfusion study in August 2013  showed fairly normal perfusion.  He recently developed CHF symptomatology secondary to sodium excess.  This responded to increased diarrhetic dementia regimen including Lasix 80 mg daily.  He has no longer taking metolazone.  He is back to his baseline weight.  His blood pressure today is controlled on current therapy consisting of enalapril 20, no grams twice a day, Lasix 80 mg, metoprolol 50 g twice a day and amlodipine 5 mg.  His atrial fibrillation is permanent, but his rate is controlled also on digoxin 0.125 mg daily.  He was instructed to take an extra Lasix as necessary depending upon his peripheral edema and weight with being careful attention to notice any change in weight greater than 3 pounds.  As long as he continues to be stable, I will  see him in 6 month for reevaluation, but available sooner if problems arise. Time spent: 25 minutes  Troy Sine, MD, [facility]  10/21/2014 1:30 PM

## 2014-11-10 ENCOUNTER — Other Ambulatory Visit: Payer: Self-pay | Admitting: Cardiovascular Disease

## 2014-11-10 NOTE — Telephone Encounter (Signed)
Rx(s) sent to pharmacy electronically.  

## 2014-11-12 ENCOUNTER — Other Ambulatory Visit: Payer: Self-pay | Admitting: Internal Medicine

## 2014-11-14 NOTE — Telephone Encounter (Signed)
08/16/14 

## 2014-11-14 NOTE — Telephone Encounter (Signed)
rx called into pharmacy

## 2014-11-14 NOTE — Telephone Encounter (Signed)
Approved: okay #[id number] [phone] 

## 2014-11-23 ENCOUNTER — Ambulatory Visit: Payer: Medicare Other | Admitting: Podiatry

## 2014-11-23 ENCOUNTER — Ambulatory Visit: Payer: Self-pay

## 2014-12-21 ENCOUNTER — Encounter: Payer: Self-pay | Admitting: Podiatry

## 2014-12-21 ENCOUNTER — Ambulatory Visit: Payer: Medicare Other

## 2014-12-21 ENCOUNTER — Ambulatory Visit (INDEPENDENT_AMBULATORY_CARE_PROVIDER_SITE_OTHER): Payer: Medicare Other | Admitting: Podiatry

## 2014-12-21 ENCOUNTER — Ambulatory Visit (INDEPENDENT_AMBULATORY_CARE_PROVIDER_SITE_OTHER): Payer: Medicare Other

## 2014-12-21 VITALS — BP 129/77 | HR 58 | Resp 16

## 2014-12-21 DIAGNOSIS — I255 Ischemic cardiomyopathy: Secondary | ICD-10-CM | POA: Diagnosis not present

## 2014-12-21 DIAGNOSIS — M25571 Pain in right ankle and joints of right foot: Secondary | ICD-10-CM

## 2014-12-21 DIAGNOSIS — R260 Ataxic gait: Secondary | ICD-10-CM

## 2014-12-21 DIAGNOSIS — M19071 Primary osteoarthritis, right ankle and foot: Secondary | ICD-10-CM

## 2014-12-21 NOTE — Progress Notes (Signed)
Subjective:     Patient ID: Lee Gutierrez, male   DOB: 1930-08-19, 81 y.o.   MRN: [id number]  HPI 81 year old male presents the office at this the contents of right ankle pain which has been ongoing for several years has been progressive. The patient states that his ankle is painful but he would independent he states the cracks and pops in a dependent. He previously undergone an ankle fusion on the left side and 2013 however given his medical conditions I do not want to pursue surgical intervention on the right side to the recovery. He previously been prescribed a brace however he is unable to wear it and he also believes that his ankles worsened since he had the brace. He is inquiring as it does become a brace that can be made that he should put on the degenerative tissue. No other complaints at this time.  Review of Systems  All other systems reviewed and are negative.      Objective:   Physical Exam AAO x3, NAD; uses walker to help with walking due to pain DP/PT pulses palpable bilaterally, CRT less than 3 seconds Protective sensation intact with Simms Weinstein monofilament Ankle joint range of motion is an absence of left-sided status post fusion. On the right side there is some slight motion of the ankle joint however there does appear to be a popping and crepitus with range of motion. There is pain with ankle joint range of motion. There is also decrease in subtalar joint range of motion. There is no area pinpoint bony tenderness or pain the vibratory sensation. There is pain on the ankle joint. There is no overlying edema, erythema, increased warmth. No open lesions or pre-ulcerative lesions.  No pain with calf compression, swelling, warmth, erythema bilaterally.      Assessment:     81 year old male with right ankle arthritis    Plan:     -X-rays were obtained and reviewed with the patient. There is significant arthritic changes of the ankle joint. Is also arthritic changes of  the midfoot and subtalar joint.  -Treatment options discussed including all alternatives, risks, and complications -I do believe that he would benefit from brace that he be able to wear them himself. Also, he states the ankle has worsened since the last brace was made. At today's appointment he was seen by [facility] and casted for a brace. He was fitted [place] brace with velcro.  -Follow-up after brace or sooner if any problems arise. In the meantime, encouraged to call the office with any questions, concerns, change in symptoms.   Celesta Gentile, DPM Celesta Gentile, DPM

## 2014-12-25 ENCOUNTER — Encounter: Payer: Self-pay | Admitting: Internal Medicine

## 2014-12-25 ENCOUNTER — Ambulatory Visit (INDEPENDENT_AMBULATORY_CARE_PROVIDER_SITE_OTHER): Payer: Medicare Other | Admitting: Internal Medicine

## 2014-12-25 VITALS — BP 112/70 | HR 70 | Temp 97.4°F | Ht 70.0 in | Wt 230.0 lb

## 2014-12-25 DIAGNOSIS — I5042 Chronic combined systolic (congestive) and diastolic (congestive) heart failure: Secondary | ICD-10-CM | POA: Insufficient documentation

## 2014-12-25 DIAGNOSIS — Z7189 Other specified counseling: Secondary | ICD-10-CM | POA: Insufficient documentation

## 2014-12-25 DIAGNOSIS — Z23 Encounter for immunization: Secondary | ICD-10-CM

## 2014-12-25 DIAGNOSIS — I482 Chronic atrial fibrillation, unspecified: Secondary | ICD-10-CM

## 2014-12-25 DIAGNOSIS — I272 Pulmonary hypertension, unspecified: Secondary | ICD-10-CM

## 2014-12-25 DIAGNOSIS — I712 Thoracic aortic aneurysm, without rupture, unspecified: Secondary | ICD-10-CM

## 2014-12-25 DIAGNOSIS — I270 Primary pulmonary hypertension: Secondary | ICD-10-CM

## 2014-12-25 DIAGNOSIS — N400 Enlarged prostate without lower urinary tract symptoms: Secondary | ICD-10-CM

## 2014-12-25 DIAGNOSIS — I2510 Atherosclerotic heart disease of native coronary artery without angina pectoris: Secondary | ICD-10-CM | POA: Diagnosis not present

## 2014-12-25 DIAGNOSIS — I255 Ischemic cardiomyopathy: Secondary | ICD-10-CM | POA: Diagnosis not present

## 2014-12-25 DIAGNOSIS — Z0000 Encounter for general adult medical examination without abnormal findings: Secondary | ICD-10-CM | POA: Insufficient documentation

## 2014-12-25 LAB — CBC WITH DIFFERENTIAL/PLATELET
BASOS PCT: 0.9 % (ref 0.0–3.0)
Basophils Absolute: 0 10*3/uL (ref 0.0–0.1)
EOS PCT: 8.8 % — AB (ref 0.0–5.0)
Eosinophils Absolute: 0.4 10*3/uL (ref 0.0–0.7)
HCT: 34.7 % — ABNORMAL LOW (ref 39.0–52.0)
HEMOGLOBIN: 11.5 g/dL — AB (ref 13.0–17.0)
LYMPHS ABS: 0.6 10*3/uL — AB (ref 0.7–4.0)
Lymphocytes Relative: 12 % (ref 12.0–46.0)
MCHC: 33.1 g/dL (ref 30.0–36.0)
MCV: 98.2 fl (ref 78.0–100.0)
MONO ABS: 0.5 10*3/uL (ref 0.1–1.0)
Monocytes Relative: 10.4 % (ref 3.0–12.0)
NEUTROS PCT: 67.9 % (ref 43.0–77.0)
Neutro Abs: 3.4 10*3/uL (ref 1.4–7.7)
Platelets: 163 10*3/uL (ref 150.0–400.0)
RBC: 3.53 Mil/uL — ABNORMAL LOW (ref 4.22–5.81)
RDW: 14.9 % (ref 11.5–15.5)
WBC: 5.1 10*3/uL (ref 4.0–10.5)

## 2014-12-25 LAB — COMPREHENSIVE METABOLIC PANEL
ALT: 16 U/L (ref 0–53)
AST: 26 U/L (ref 0–37)
Albumin: 3.7 g/dL (ref 3.5–5.2)
Alkaline Phosphatase: 121 U/L — ABNORMAL HIGH (ref 39–117)
BUN: 28 mg/dL — ABNORMAL HIGH (ref 6–23)
CHLORIDE: 101 meq/L (ref 96–112)
CO2: 28 mEq/L (ref 19–32)
Calcium: 9.5 mg/dL (ref 8.4–10.5)
Creatinine, Ser: 0.92 mg/dL (ref 0.40–1.50)
GFR: 83.68 mL/min (ref >60.00)
GLUCOSE: 83 mg/dL (ref 70–99)
Potassium: 4.1 mEq/L (ref 3.5–5.1)
SODIUM: 136 meq/L (ref 135–145)
Total Bilirubin: 1 mg/dL (ref 0.2–1.2)
Total Protein: 6.5 g/dL (ref 6.0–8.3)

## 2014-12-25 LAB — LIPID PANEL
Cholesterol: 96 mg/dL (ref 0–200)
HDL: 43.3 mg/dL (ref >39.00)
LDL Cholesterol: 38 mg/dL (ref 0–99)
NONHDL: 52.44
Total CHOL/HDL Ratio: 2
Triglycerides: 74 mg/dL (ref 0.0–149.0)
VLDL: 14.8 mg/dL (ref 0.0–40.0)

## 2014-12-25 LAB — T4, FREE: Free T4: 1.2 ng/dL (ref 0.60–1.60)

## 2014-12-25 NOTE — Assessment & Plan Note (Signed)
See social history 

## 2014-12-25 NOTE — Assessment & Plan Note (Signed)
Is monitoring weight  Metolazone prn DOE is stable now

## 2014-12-25 NOTE — Assessment & Plan Note (Signed)
No active angina

## 2014-12-25 NOTE — Assessment & Plan Note (Signed)
Rate control is good ASA only

## 2014-12-25 NOTE — Assessment & Plan Note (Signed)
High pressures per echo Keeps on the diuretics and status stable

## 2014-12-25 NOTE — Addendum Note (Signed)
Addended by: Despina Hidden on: 12/25/2014 12:08 PM   Modules accepted: Orders

## 2014-12-25 NOTE — Assessment & Plan Note (Signed)
Voids okay on tamsulosin

## 2014-12-25 NOTE — Progress Notes (Signed)
Subjective:    Patient ID: Lee Gutierrez, male    DOB: 1930-12-02, 81 y.o.   MRN: [id number]  HPI Here with SIL for Medicare wellness and follow up of chronic medical problems Reviewed form and advanced directives Reviewed other doctors No tobacco or alcohol Vision is okay. Cataract in one eye per Dr Lee Gutierrez, but just observing Hearing is poor --- has new hearing aide on right. Nothing on left Tries to go to [employer] once a week 2 falls out of bed-- some left hip problems (but has followed with ortho and no major issues) No apparent cognitive decline No depression or anhedonia  He has moved in with daughter and SIL He is pleased with this He can fix himself something quick in kitchen but no other instrumental ADLs Independent with ADLs still Remains continent usually. Rare fecal incontinence at times  Ongoing trouble with sleep Tries to take the tramadol to relieve pain Still uses tylenol PM --despite my concerns voiced last year Doesn't seem to have problems with this though  Heart seems to be okay No chest pain No palpitations Gets DOE and has to work on proper breathing--- if just finished with exertion Recent fluid accumulation-- better with diuretics Monitors weight--but not daily. Discussed Sleeps fairly flat. No PND  No abdominal pain No blood in stool Sees Dr Lee Gutierrez yearly to follow up aneurysm  Passes urine okay Nocturia usually once No daytime urgency  Current Outpatient Prescriptions on File Prior to Visit  Medication Sig Dispense Refill  . amLODipine (NORVASC) 5 MG tablet Take 1 tablet (5 mg total) by mouth daily. 90 tablet 4  . aspirin 81 MG tablet Take 81 mg by mouth daily.    . Cholecalciferol (VITAMIN D) 2000 UNITS tablet Take 2,000 Units by mouth daily.    . digoxin (LANOXIN) 0.125 MG tablet Take 0.5 tablets (0.0625 mg total) by mouth daily. 90 tablet 1  . enalapril (VASOTEC) 20 MG tablet TAKE 1 TABLET TWICE A DAY (THIS REPLACES THE  AMLODIPINE) 180 tablet 3  . furosemide (LASIX) 40 MG tablet TAKE 2 TABLETS (80 MG TOTAL) BY MOUTH DAILY. 180 tablet 2  . KLOR-CON M10 10 MEQ tablet TAKE 2 TABLETS BY MOUTH EVERY DAY 180 tablet 2  . loratadine (CLARITIN) 10 MG tablet Take 10 mg by mouth daily.    . Melatonin 10 MG TABS Take 1 tablet by mouth at bedtime.    . metolazone (ZAROXOLYN) 2.5 MG tablet Take 1 tablet (2.5 mg total) by mouth daily as needed. 1/2 hour before furosemide for excessive swelling in legs 30 tablet 6  . metoprolol (LOPRESSOR) 50 MG tablet TAKE 1 TABLET (50 MG TOTAL) BY MOUTH 2 (TWO) TIMES DAILY. 180 tablet 3  . Multiple Vitamin (MULTIVITAMIN WITH MINERALS) TABS Take 1 tablet by mouth daily. Centrum Silver    . simvastatin (ZOCOR) 20 MG tablet TAKE 1 TABLET BY MOUTH AT BEDTIME. 90 tablet 3  . tamsulosin (FLOMAX) 0.4 MG CAPS capsule TAKE 1 CAPSULE (0.4 MG TOTAL) BY MOUTH DAILY. 90 capsule 3  . traMADol (ULTRAM) 50 MG tablet TAKE 1 TABLET BY MOUTH 3 TIMES A DAY AS NEEDED 90 tablet 0   No current facility-administered medications on file prior to visit.    No Known Allergies  Past Medical History  Diagnosis Date  . Hypertension   . Arthritis   . Coronary artery disease   . 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 5 x 4.9, proximal descending  thoracic aortic aorta measuring 5.8 x 5.1, the aortic 5.6 x 5.5 and distal thoracic aorta 3.9 x 4.1, His infrarenal aneurysm measured 3 x 2.7.  . Shortness of breath   . Aortic dissection   . Dysrhythmia     atrial fib  . Blood transfusion   . CHF, acute on chronic 03/24/2011  . HOH (hard of hearing)     bilateral hearing aids  . Hx of echocardiogram 12/2011    EF 40% showed severe LA dilation, He had moderate concentric LHV. He has moderate inferior wall hypokinesis. There was evidence for pulmonary hypertension with an estimated RV systolic pressure at 62. He had moderate TR  . History of stress test 12/2011    Showed mild inferior  thinning felt most likely due to attenuation artifact.    Past Surgical History  Procedure Laterality Date  . Replacement total knee      bilaterally  . Joint replacement      Bilateral knee  . Tonsillectomy    . Hernia repair    . Cardiac catheterization    . Coronary artery bypass graft  Feb 2011  . Ankle fusion  04/22/2012    Procedure: ARTHRODESIS ANKLE;  Surgeon: Lee Simmer, MD;  Location: [facility];  Service: Orthopedics;  Laterality: Left;  Left Ankle and Subtalar Arthrodesis     Family History  Problem Relation Age of Onset  . Hypertension Father   . COPD Brother   . Diabetes Neg Hx   . Cancer Neg Hx   . Heart attack Neg Hx   . Stroke Neg Hx     Social History   Social History  . Marital Status: Widowed    Spouse Name: N/A  . Number of Children: 3  . Years of Education: N/A   Occupational History  . retired Architect    Social History Main Topics  . Smoking status: Former Smoker    Quit date: 02/04/1963  . Smokeless tobacco: Never Used  . Alcohol Use: No  . Drug Use: No  . Sexual Activity: Not Currently   Other Topics Concern  . Not on file   Social History Narrative   Has living will.   Daughter Lee Gutierrez has health care POA    Would want resuscitation attempts but no prolonged artificial ventilation.   Would not want tube feedings if cognitively aware   Review of Systems Wears seat belt Teeth okay--keeps up with dentist No skin problems--sees Dr Lee Gutierrez Notices small bulge under larger ventral hernia lately. No pain    Objective:   Physical Exam  Constitutional: He is oriented to person, place, and time. He appears well-developed and well-nourished. No distress.  HENT:  Mouth/Throat: Oropharynx is clear and moist. No oropharyngeal exudate.  Neck: Normal range of motion. Neck supple. No thyromegaly present.  Cardiovascular: Normal rate.  Exam reveals no gallop.   No murmur heard. Irregular Faint distal pulses  Pulmonary/Chest: Effort  normal. No respiratory distress. He has no wheezes. He has no rales.  Dullness with some decreased breath sounds just at left base  Abdominal: Soft. He exhibits no distension. There is no tenderness. There is no rebound and no guarding.  Reducible ventral hernia below incision in midline  Musculoskeletal:  1+ ankle edema  Lymphadenopathy:    He has no cervical adenopathy.  Neurological: He is alert and oriented to person, place, and time.  President "Obama, ?" [phone].... [id number] Recall 1/3  Skin:  Venous stasis changes No ulcers  Psychiatric: He  has a normal mood and affect. His behavior is normal.          Assessment & Plan:

## 2014-12-25 NOTE — Assessment & Plan Note (Signed)
Follows with Dr Cyndia Bent yearly

## 2014-12-25 NOTE — Progress Notes (Signed)
Pre visit review using our clinic review tool, if applicable. No additional management support is needed unless otherwise documented below in the visit note. 

## 2014-12-25 NOTE — Assessment & Plan Note (Signed)
I have personally reviewed the Medicare Annual Wellness questionnaire and have noted 1. The patient's medical and social history 2. Their use of alcohol, tobacco or illicit drugs 3. Their current medications and supplements 4. The patient's functional ability including ADL's, fall risks, home safety risks and hearing or visual             impairment. 5. Diet and physical activities 6. Evidence for depression or mood disorders  The patients weight, height, BMI and visual acuity have been recorded in the chart I have made referrals, counseling and provided education to the patient based review of the above and I have provided the pt with a written personalized care plan for preventive services.  I have provided you with a copy of your personalized plan for preventive services. Please take the time to review along with your updated medication list.  Will do prevnar today---pneumovax next year Yearly flu vaccine No cancer screening due to age

## 2014-12-26 LAB — DIGOXIN LEVEL

## 2015-01-09 ENCOUNTER — Other Ambulatory Visit: Payer: Self-pay | Admitting: Cardiovascular Disease

## 2015-01-09 NOTE — Telephone Encounter (Signed)
REFILL 

## 2015-01-18 ENCOUNTER — Ambulatory Visit: Payer: Medicare Other

## 2015-01-18 DIAGNOSIS — R260 Ataxic gait: Secondary | ICD-10-CM

## 2015-01-18 DIAGNOSIS — M19071 Primary osteoarthritis, right ankle and foot: Secondary | ICD-10-CM

## 2015-01-22 ENCOUNTER — Telehealth: Payer: Self-pay | Admitting: Cardiovascular Disease

## 2015-01-22 DIAGNOSIS — I10 Essential (primary) hypertension: Secondary | ICD-10-CM

## 2015-01-22 NOTE — Telephone Encounter (Signed)
Please call,pt had a lot of fluid this week-end. Please call to advise.

## 2015-01-22 NOTE — Telephone Encounter (Signed)
Lee Gutierrez patients daughter called and said that he had quite a bit of swelling from right knee to ankle over the weekend  Took metolazone as prescribed Saturday and Sunday  Swelling went down but is still there  Weight went from 235 lbs to 230 lbs.  Takes his daily 80 mg Lasix at noon  Daughter would like to know if he could have an extra dose of lasix today.  Routed to Dr. Claiborne Billings and Dr. Stanford Breed DOD

## 2015-01-22 NOTE — Telephone Encounter (Signed)
Ok to take additional dose of lasix today; recheck bmet in AM with results to Dr Pleas Koch

## 2015-01-22 NOTE — Telephone Encounter (Signed)
Called [name] patients daughter  Instructed her to give an extra Lasix 40 mg today  Patient will have BMET drawn tomorrow .  Order placed in system

## 2015-01-23 NOTE — Telephone Encounter (Signed)
ackowledged 

## 2015-01-24 ENCOUNTER — Telehealth: Payer: Self-pay | Admitting: Cardiovascular Disease

## 2015-01-24 LAB — BASIC METABOLIC PANEL
BUN/Creatinine Ratio: 23 — ABNORMAL HIGH (ref 10–22)
BUN: 27 mg/dL (ref 8–27)
CO2: 25 mmol/L (ref 18–29)
CREATININE: 1.15 mg/dL (ref 0.76–1.27)
Calcium: 9.9 mg/dL (ref 8.6–10.2)
Chloride: 94 mmol/L — ABNORMAL LOW (ref 97–108)
GFR, EST AFRICAN AMERICAN: 68 mL/min/{1.73_m2} (ref >59)
GFR, EST NON AFRICAN AMERICAN: 59 mL/min/{1.73_m2} — AB (ref >59)
Glucose: 59 mg/dL — ABNORMAL LOW (ref 65–99)
POTASSIUM: 3.8 mmol/L (ref 3.5–5.2)
SODIUM: 137 mmol/L (ref 134–144)

## 2015-01-24 NOTE — Telephone Encounter (Signed)
Returned call to patient's son n law Juluis Rainier calling for lab results done 01/23/15.Advised lab results not available.Advised I will send message to Dr.Kelly.

## 2015-01-24 NOTE — Telephone Encounter (Signed)
Pt's son-in-law called in wanting to speak with Mariann Laster about his Glucose levels, he wants to make sure that his kidneys are still not being effected. Please call  Thanks

## 2015-01-30 NOTE — Telephone Encounter (Signed)
Has this been taken care of?

## 2015-02-05 ENCOUNTER — Telehealth: Payer: Self-pay | Admitting: Cardiovascular Disease

## 2015-02-05 DIAGNOSIS — I272 Pulmonary hypertension, unspecified: Secondary | ICD-10-CM

## 2015-02-05 DIAGNOSIS — I5041 Acute combined systolic (congestive) and diastolic (congestive) heart failure: Secondary | ICD-10-CM

## 2015-02-05 MED ORDER — FUROSEMIDE 40 MG PO TABS: ORAL_TABLET | ORAL | Status: DC

## 2015-02-05 NOTE — Telephone Encounter (Signed)
Pt is swelling,a lot of fluid around his lower abdomen. Please call to advise.

## 2015-02-05 NOTE — Telephone Encounter (Signed)
Spoke to patient's daughter [name] lab results given.Will repeat bmet in 1 week at [facility] in [place].

## 2015-02-05 NOTE — Telephone Encounter (Signed)
Lab reviewed; Cr 1.15;  Will inc lasix to 80 am/40 in afternoon; f/u bmet in 1 week.

## 2015-02-05 NOTE — Telephone Encounter (Signed)
Returned call to patient's daughter [name].Dr.Kelly advised recent bmet ok.Advised to increase lasix to 40 mg 2 tablets at noon and 40 mg 1 tablet at evening.Advised to repeat bmet in 1 week.

## 2015-02-05 NOTE — Telephone Encounter (Signed)
Returned call to patient's daughter [name].She stated father having swelling in lower abdomen,chest congested,non productive cough for the past 1 week.Stated he took metolazone 2.5 mg this past Sat 9/24.Stated he takes lasix 40 mg 2 tablets at noon.Advised I will speak to Dr.Kelly and call you back.

## 2015-02-09 ENCOUNTER — Other Ambulatory Visit: Payer: Self-pay | Admitting: Internal Medicine

## 2015-02-09 NOTE — Telephone Encounter (Signed)
11/14/14 

## 2015-02-09 NOTE — Telephone Encounter (Signed)
rx called into pharmacy

## 2015-02-09 NOTE — Telephone Encounter (Signed)
Approved: #[id number] x [id number] 

## 2015-02-12 ENCOUNTER — Telehealth: Payer: Self-pay | Admitting: Cardiovascular Disease

## 2015-02-12 DIAGNOSIS — I272 Pulmonary hypertension, unspecified: Secondary | ICD-10-CM

## 2015-02-12 DIAGNOSIS — I5041 Acute combined systolic (congestive) and diastolic (congestive) heart failure: Secondary | ICD-10-CM

## 2015-02-12 NOTE — Telephone Encounter (Signed)
Returned call to [facility], notified that I released order, have been trying to reach [facility] in [place] (this is [facility] location at [phone]).  Every time i call I get busy signal. Notified [name] to call father and let him know to inform staff there that order should be visible. She voiced understanding.  I will reattempt communication w/ [facility] shortly.

## 2015-02-12 NOTE — Telephone Encounter (Signed)
Pt at [facility],waiting for an order. Please [phone].

## 2015-02-12 NOTE — Telephone Encounter (Signed)
Pt's daughter is calling back to see if the fax was sent to the [facility] in [place]. She says that her father has been at the office for over an hour. The fax number is [phone]  Thanks

## 2015-02-12 NOTE — Telephone Encounter (Signed)
Multiple attempts - [facility] line still busy when dialed.

## 2015-02-13 LAB — BASIC METABOLIC PANEL
BUN/Creatinine Ratio: 23 — ABNORMAL HIGH (ref 10–22)
BUN: 22 mg/dL (ref 8–27)
CALCIUM: 9.7 mg/dL (ref 8.6–10.2)
CO2: 26 mmol/L (ref 18–29)
Chloride: 99 mmol/L (ref 97–108)
Creatinine, Ser: 0.95 mg/dL (ref 0.76–1.27)
GFR, EST AFRICAN AMERICAN: 86 mL/min/{1.73_m2} (ref >59)
GFR, EST NON AFRICAN AMERICAN: 74 mL/min/{1.73_m2} (ref >59)
Glucose: 83 mg/dL (ref 65–99)
POTASSIUM: 4.6 mmol/L (ref 3.5–5.2)
Sodium: 139 mmol/L (ref 134–144)

## 2015-02-17 ENCOUNTER — Encounter: Payer: Self-pay | Admitting: Cardiovascular Disease

## 2015-02-18 ENCOUNTER — Encounter: Payer: Self-pay | Admitting: Cardiovascular Disease

## 2015-02-19 ENCOUNTER — Encounter: Payer: Self-pay | Admitting: Cardiovascular Disease

## 2015-02-21 ENCOUNTER — Telehealth: Payer: Self-pay | Admitting: *Deleted

## 2015-02-21 ENCOUNTER — Encounter: Payer: Self-pay | Admitting: Nurse Practitioner

## 2015-02-21 ENCOUNTER — Ambulatory Visit (INDEPENDENT_AMBULATORY_CARE_PROVIDER_SITE_OTHER): Payer: Medicare Other | Admitting: Nurse Practitioner

## 2015-02-21 VITALS — BP 120/70 | HR 73 | Ht 70.0 in | Wt 244.8 lb

## 2015-02-21 DIAGNOSIS — I482 Chronic atrial fibrillation, unspecified: Secondary | ICD-10-CM

## 2015-02-21 DIAGNOSIS — I5021 Acute systolic (congestive) heart failure: Secondary | ICD-10-CM

## 2015-02-21 DIAGNOSIS — I255 Ischemic cardiomyopathy: Secondary | ICD-10-CM

## 2015-02-21 LAB — HEPATIC FUNCTION PANEL
ALT: 17 U/L (ref 9–46)
AST: 29 U/L (ref 10–35)
Albumin: 4 g/dL (ref 3.6–5.1)
Alkaline Phosphatase: 130 U/L — ABNORMAL HIGH (ref 40–115)
Bilirubin, Direct: 0.4 mg/dL — ABNORMAL HIGH (ref <=0.2)
Indirect Bilirubin: 0.6 mg/dL (ref 0.2–1.2)
Total Bilirubin: 1 mg/dL (ref 0.2–1.2)
Total Protein: 6.5 g/dL (ref 6.1–8.1)

## 2015-02-21 LAB — BASIC METABOLIC PANEL
BUN: 25 mg/dL (ref 7–25)
CO2: 30 mmol/L (ref 20–31)
Calcium: 9.7 mg/dL (ref 8.6–10.3)
Chloride: 99 mmol/L (ref 98–110)
Creat: 1.03 mg/dL (ref 0.70–1.11)
Glucose, Bld: 85 mg/dL (ref 65–99)
Potassium: 3.7 mmol/L (ref 3.5–5.3)
Sodium: 137 mmol/L (ref 135–146)

## 2015-02-21 LAB — CBC
HCT: 33.4 % — ABNORMAL LOW (ref 39.0–52.0)
Hemoglobin: 11.1 g/dL — ABNORMAL LOW (ref 13.0–17.0)
MCH: 32.3 pg (ref 26.0–34.0)
MCHC: 33.2 g/dL (ref 30.0–36.0)
MCV: 97.1 fL (ref 78.0–100.0)
MPV: 10.6 fL (ref 8.6–12.4)
Platelets: 191 10*3/uL (ref 150–400)
RBC: 3.44 MIL/uL — ABNORMAL LOW (ref 4.22–5.81)
RDW: 14.9 % (ref 11.5–15.5)
WBC: 5.4 10*3/uL (ref 4.0–10.5)

## 2015-02-21 NOTE — Progress Notes (Signed)
CARDIOLOGY OFFICE NOTE  Date:  02/21/2015    [name] Date of Birth: 1931/04/30 Medical Record #[id number]  PCP:  Viviana Simpler, MD  Cardiologist:  Claiborne Billings    Chief Complaint  Patient presents with  . Congestive Heart Failure    Work in visit - seen for Dr. Claiborne Billings    History of Present Illness: Lee Gutierrez is a 81 y.o. male who presents today for a work in visit. Seen for Dr. Claiborne Billings. He has a history of CAD with prior CABG in 2011 with LIMA to LAD, SVG to marginal, SVG to 2nd DX. He has chronic atrial fib. 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 Cyndia Bent.  Because of the aneurysm and type III aortic dissection is only on aspirin rather than full dose anticoagulation.   He is felt not to be a candidate for open surgical repair by Dr. and that the aorta proximally and distally is probably too large to allow staff stent graft placement with the currently available stent graft. For this reason, continued medical therapy was recommended.  He was last seen by Dr. Claiborne Billings back in June.   Phone call today - "Called patient's daughter based on MyChart message update. Patient's weight has increased to 240lbs. She reports his BP is 133/81 which is elevated for patient. Per daughter, his BP should be in low 779T systolic d/t his aortic dissection. She reports he is very swollen in his legs and abdomen and is lethargic. Asked if he has taken prn metolazone, and per daughter he is not to take this if he can avoid it - he only took one dose Monday evening. She said this dose did not help".  Thus added to my schedule.  Comes in today. Here with his daughter [name]. He has progressively been gaining weight and getting more short of breath. Weight is up. He has called several times and had some adjusted dosing of his Lasix but has basically failed on outpatient therapy. No chest pain. He is short of breath -  sometimes even with talking and always with just minimal activities. More swelling. Feels bloated. Sounds like he does not really get too much in the way of salt. Last echo from 2013 showed an EF around 35%.     Past Medical History  Diagnosis Date  . Hypertension   . Arthritis   . Coronary artery disease   . 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 5 x 4.9, proximal descending thoracic aortic aorta measuring 5.8 x 5.1, the aortic 5.6 x 5.5 and distal thoracic aorta 3.9 x 4.1, His infrarenal aneurysm measured 3 x 2.7.  . Shortness of breath   . Aortic dissection   . Dysrhythmia     atrial fib  . Blood transfusion   . CHF, acute on chronic 03/24/2011  . HOH (hard of hearing)     bilateral hearing aids  . Hx of echocardiogram 12/2011    EF 40% showed severe LA dilation, He had moderate concentric LHV. He has moderate inferior wall hypokinesis. There was evidence for pulmonary hypertension with an estimated RV systolic pressure at 62. He had moderate TR  . History of stress test 12/2011    Showed mild inferior thinning felt most likely due to attenuation artifact.    Past Surgical History  Procedure Laterality Date  . Replacement total knee  bilaterally  . Joint replacement      Bilateral knee  . Tonsillectomy    . Hernia repair    . Cardiac catheterization    . Coronary artery bypass graft  Feb 2011  . Ankle fusion  04/22/2012    Procedure: ARTHRODESIS ANKLE;  Surgeon: Wylene Simmer, MD;  Location: [facility];  Service: Orthopedics;  Laterality: Left;  Left Ankle and Subtalar Arthrodesis      Medications: Current Outpatient Prescriptions  Medication Sig Dispense Refill  . amoxicillin (AMOXIL) 500 MG capsule TAKE 4 CAPSULES BY MOUTH 1 HOUR PRIOR TO DENTAL APPT  1  . aspirin 81 MG tablet Take 81 mg by mouth daily.    . Cholecalciferol (VITAMIN D) 2000 UNITS tablet Take 2,000 Units by mouth daily.    . digoxin (LANOXIN) 0.125 MG tablet Take 0.5 tablets  (0.0625 mg total) by mouth daily. 90 tablet 1  . enalapril (VASOTEC) 20 MG tablet TAKE 1 TABLET TWICE A DAY (THIS REPLACES THE AMLODIPINE) 180 tablet 3  . furosemide (LASIX) 40 MG tablet Take 40 mg 2 tablets at noon and 40 mg 1 tablet in pm. 90 tablet 6  . KLOR-CON M10 10 MEQ tablet TAKE 2 TABLETS BY MOUTH EVERY DAY 180 tablet 2  . loratadine (CLARITIN) 10 MG tablet Take 10 mg by mouth daily.    . Melatonin 10 MG TABS Take 1 tablet by mouth at bedtime.    . metolazone (ZAROXOLYN) 2.5 MG tablet Take 1 tablet (2.5 mg total) by mouth daily as needed. 1/2 hour before furosemide for excessive swelling in legs 30 tablet 6  . metoprolol (LOPRESSOR) 50 MG tablet TAKE 1 TABLET (50 MG TOTAL) BY MOUTH 2 (TWO) TIMES DAILY. 180 tablet 3  . Multiple Vitamin (MULTIVITAMIN WITH MINERALS) TABS Take 1 tablet by mouth daily. Centrum Silver    . simvastatin (ZOCOR) 20 MG tablet TAKE 1 TABLET BY MOUTH AT BEDTIME. 90 tablet 3  . tamsulosin (FLOMAX) 0.4 MG CAPS capsule TAKE 1 CAPSULE (0.4 MG TOTAL) BY MOUTH DAILY. 90 capsule 3  . traMADol (ULTRAM) 50 MG tablet TAKE 1 TABLET BY MOUTH 3 TIMES A DAY AS NEEDED 90 tablet 0   No current facility-administered medications for this visit.    Allergies: No Known Allergies  Social History: The patient  reports that he quit smoking about 52 years ago. He has never used smokeless tobacco. He reports that he does not drink alcohol or use illicit drugs.   Family History: The patient's family history includes COPD in his brother; Hypertension in his father. There is no history of Diabetes, Cancer, Heart attack, or Stroke.   Review of Systems: Please see the history of present illness.   Otherwise, the review of systems is positive for swelling, weight gain, DOE, fatigue and balance issues.   All other systems are reviewed and negative.   Physical Exam: VS:  BP 120/70 mmHg  Pulse 73  Ht 5\' 10"  (1.778 m)  Wt 244 lb 12.8 oz (111.041 kg)  BMI 35.13 kg/m2  SpO2 98% .  BMI  Body mass index is 35.13 kg/(m^2).  Wt Readings from Last 3 Encounters:  02/21/15 244 lb 12.8 oz (111.041 kg)  12/25/14 230 lb (104.327 kg)  10/19/14 226 lb 11.2 oz (102.83 kg)    General: Pleasant. He looks chronically ill. Quite pale. He is short of breath with minimal activity.   HEENT: Normal. Neck: Supple, no JVD, carotid bruits, or masses noted.  Cardiac: Irregular irregular rhythm. Over  2+ edema.  Respiratory:  Lungs are clear to auscultation bilaterally with normal work of breathing.  GI: Distended but soft.  MS: No deformity or atrophy. Gait and ROM intact. He is using a walker.  Skin: Warm and dry. Color is quite pale. Neuro:  Strength and sensation are intact and no gross focal deficits noted.  Psych: Alert, appropriate and with normal affect.   LABORATORY DATA:  EKG:  EKG is ordered today. This demonstrates atrial fib.  Lab Results  Component Value Date   WBC 5.1 12/25/2014   HGB 11.5* 12/25/2014   HCT 34.7* 12/25/2014   PLT 163.0 12/25/2014   GLUCOSE 83 02/12/2015   CHOL 96 12/25/2014   TRIG 74.0 12/25/2014   HDL 43.30 12/25/2014   LDLCALC 38 12/25/2014   ALT 16 12/25/2014   AST 26 12/25/2014   NA 139 02/12/2015   K 4.6 02/12/2015   CL 99 02/12/2015   CREATININE 0.95 02/12/2015   BUN 22 02/12/2015   CO2 26 02/12/2015   TSH 1.730 02/10/2014   INR 1.62* 06/14/2009   HGBA1C  06/13/2009    5.7 (NOTE) The ADA recommends the following therapeutic goal for glycemic control related to Hgb A1c measurement: Goal of therapy: <6.5 Hgb A1c  Reference: American Diabetes Association: Clinical Practice Recommendations 2010, Diabetes Care, 2010, 33: (Suppl  1).    BNP (last 3 results) No results for input(s): BNP in the last 8760 hours.  ProBNP (last 3 results)  Recent Labs  09/19/14 1710  PROBNP 527.0*     Other Studies Reviewed Today:   Assessment/Plan: 1. Acute on chronic systolic HF - NYHA III/IV - needs diuresis. Failing on current regimen. Wishes  to remain out of the hospital. Will increase Lasix to 80 mg BID. Zaroxolyn 2.5 mg for 3 days only and then stop. His last renal function was surprisingly normal. Will get echo updated. I will see him back on Monday in the [facility]. If he fails to improve, will need to consider hospital admission for IV diuresis. Will try to weigh each day. Continue to restrict salt.   2. Chronic atrial fib - his rate is controlled.   3. Long standing type III aortic dissection with no plans for surgical intervention  4. CAD with prior CABG - no active chest pain.   5. HTN - BP actually soft. Would stop his Norvasc given his LV dysfunction - may also be contributing to his swelling.   Current medicines are reviewed with the patient today.  The patient does not have concerns regarding medicines other than what has been noted above.  The following changes have been made:  See above.  Labs/ tests ordered today include:    Orders Placed This Encounter  Procedures  . Brain natriuretic peptide  . Basic metabolic panel  . CBC  . Hepatic function panel  . Digoxin level  . EKG 12-Lead     Disposition:   Further disposition to follow.   Patient is agreeable to this plan and will call if any problems develop in the interim.   Signed: Burtis Junes, RN, ANP-C 02/21/2015 4:00 PM  [facility] [address] [place], [place]  [address] Phone: [phone] Fax: [phone]

## 2015-02-21 NOTE — Telephone Encounter (Signed)
Called patient's daughter based on MyChart message update. Patient's weight has increased to 240lbs. She reports his BP is 133/81 which is elevated for patient. Per daughter, his BP should be in low 502D systolic d/t his aortic dissection. She reports he is very swollen in his legs and abdomen and is lethargic. Asked if he has taken prn metolazone, and per daughter he is not to take this if he can avoid it - he only took one dose Monday evening. She said this dose did not help.   Patient's appointment moved from 10/13 with L. Dorene Ar, NP to 10/12 with Tommas Olp, NP per daughter's request by G. Davis.  Patient/daughter are aware of appointment date/time/location

## 2015-02-21 NOTE — Patient Instructions (Addendum)
We will be checking the following labs today - BMET, BNP, CBC and HPF   Medication Instructions:    Continue with your current medicines. BUT  I am increasing the Lasix to 80 mg in the AM and 80 mg in the early afternoon for the next 3 days - then go back to 80 mg in the Am and 40 mg in the afternoon  Take the Zaroxolyn 2.5 mg daily for 3 days and then STOP  STOP Norvasc    Testing/Procedures To Be Arranged:  Echocardiogram on Monday  Follow-Up:   See me on Monday    Other Special Instructions:   Try to weigh every day  Keep restricting your salt  Call the [place] office at [phone] if you have any questions, problems or concerns.

## 2015-02-22 ENCOUNTER — Other Ambulatory Visit: Payer: Self-pay | Admitting: Nurse Practitioner

## 2015-02-22 ENCOUNTER — Ambulatory Visit: Payer: Medicare Other | Admitting: Cardiology

## 2015-02-22 DIAGNOSIS — I420 Dilated cardiomyopathy: Secondary | ICD-10-CM

## 2015-02-22 LAB — BRAIN NATRIURETIC PEPTIDE: Brain Natriuretic Peptide: 584.1 pg/mL — ABNORMAL HIGH (ref 0.0–100.0)

## 2015-02-22 LAB — DIGOXIN LEVEL: Digoxin Level: <0.5 ug/L — ABNORMAL LOW (ref 0.8–2.0)

## 2015-02-26 ENCOUNTER — Encounter: Payer: Self-pay | Admitting: Nurse Practitioner

## 2015-02-26 ENCOUNTER — Other Ambulatory Visit: Payer: Self-pay

## 2015-02-26 ENCOUNTER — Ambulatory Visit (INDEPENDENT_AMBULATORY_CARE_PROVIDER_SITE_OTHER): Payer: Medicare Other | Admitting: Nurse Practitioner

## 2015-02-26 ENCOUNTER — Ambulatory Visit (HOSPITAL_COMMUNITY): Payer: Medicare Other | Attending: Cardiovascular Disease

## 2015-02-26 VITALS — BP 100/70 | HR 52 | Ht 70.0 in | Wt 236.0 lb

## 2015-02-26 DIAGNOSIS — I712 Thoracic aortic aneurysm, without rupture: Secondary | ICD-10-CM | POA: Insufficient documentation

## 2015-02-26 DIAGNOSIS — I10 Essential (primary) hypertension: Secondary | ICD-10-CM | POA: Diagnosis not present

## 2015-02-26 DIAGNOSIS — I482 Chronic atrial fibrillation, unspecified: Secondary | ICD-10-CM

## 2015-02-26 DIAGNOSIS — I071 Rheumatic tricuspid insufficiency: Secondary | ICD-10-CM | POA: Insufficient documentation

## 2015-02-26 DIAGNOSIS — I5041 Acute combined systolic (congestive) and diastolic (congestive) heart failure: Secondary | ICD-10-CM | POA: Diagnosis not present

## 2015-02-26 DIAGNOSIS — I517 Cardiomegaly: Secondary | ICD-10-CM | POA: Insufficient documentation

## 2015-02-26 DIAGNOSIS — Z87891 Personal history of nicotine dependence: Secondary | ICD-10-CM | POA: Diagnosis not present

## 2015-02-26 DIAGNOSIS — I255 Ischemic cardiomyopathy: Secondary | ICD-10-CM

## 2015-02-26 DIAGNOSIS — I420 Dilated cardiomyopathy: Secondary | ICD-10-CM | POA: Diagnosis not present

## 2015-02-26 DIAGNOSIS — I351 Nonrheumatic aortic (valve) insufficiency: Secondary | ICD-10-CM | POA: Insufficient documentation

## 2015-02-26 DIAGNOSIS — I358 Other nonrheumatic aortic valve disorders: Secondary | ICD-10-CM | POA: Diagnosis not present

## 2015-02-26 DIAGNOSIS — I340 Nonrheumatic mitral (valve) insufficiency: Secondary | ICD-10-CM | POA: Insufficient documentation

## 2015-02-26 DIAGNOSIS — E785 Hyperlipidemia, unspecified: Secondary | ICD-10-CM | POA: Diagnosis not present

## 2015-02-26 DIAGNOSIS — I700 Atherosclerosis of aorta: Secondary | ICD-10-CM | POA: Insufficient documentation

## 2015-02-26 DIAGNOSIS — I429 Cardiomyopathy, unspecified: Secondary | ICD-10-CM | POA: Diagnosis present

## 2015-02-26 LAB — BASIC METABOLIC PANEL
BUN: 38 mg/dL — ABNORMAL HIGH (ref 7–25)
CO2: 30 mmol/L (ref 20–31)
Calcium: 9.8 mg/dL (ref 8.6–10.3)
Chloride: 94 mmol/L — ABNORMAL LOW (ref 98–110)
Creat: 1.12 mg/dL — ABNORMAL HIGH (ref 0.70–1.11)
Glucose, Bld: 87 mg/dL (ref 65–99)
Potassium: 3.8 mmol/L (ref 3.5–5.3)
Sodium: 135 mmol/L (ref 135–146)

## 2015-02-26 MED ORDER — FUROSEMIDE 40 MG PO TABS: ORAL_TABLET | ORAL | Status: DC

## 2015-02-26 NOTE — Patient Instructions (Addendum)
We will be checking the following labs today - BMET and BNP   Medication Instructions:    Continue with your current medicines. BUT  Stay on the Lasix 80 mg (2 tablets) twice a day - morning and early afternoon    Testing/Procedures To Be Arranged:  N/A  Follow-Up:   See me in one week    Other Special Instructions:   N/A  Call the [place] office at [phone] if you have any questions, problems or concerns.

## 2015-02-26 NOTE — Progress Notes (Signed)
CARDIOLOGY OFFICE NOTE  Date:  02/26/2015    Lee Gutierrez Date of Birth: 05-17-1930 Medical Record #[id number]  PCP:  Viviana Simpler, MD  Cardiologist:  Claiborne Billings    Chief Complaint  Patient presents with  . Congestive Heart Failure    Follow up visit - seen for Dr. Claiborne Billings    History of Present Illness: Lee Gutierrez is a 81 y.o. male who presents today for a follow up visit. This is a one week check. Seen for Dr. Claiborne Billings. He has a history of CAD with prior CABG in 2011 with LIMA to LAD, SVG to marginal, SVG to 2nd DX. He has chronic atrial fib. 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 Cyndia Bent. Because of the aneurysm and type III aortic dissection is only on aspirin rather than full dose anticoagulation. He is felt not to be a candidate for open surgical repair by Dr. and that the aorta proximally and distally is probably too large to allow staff stent graft placement with the currently available stent graft. For this reason, continued medical therapy was recommended.  He was last seen by Dr. Claiborne Billings back in June.   Phone call earlier this month in regards to weight gain - I ended up seeing him last week.  He was more short of breath. I adjusted his diuretics and got his echo updated. Last EF about 35% by echo in 2013. He wanted to continue with an outpatient management as long as possible.   Comes in today. Here with his son today. His weight is down 8 pounds over the last 4 days. He is less short of breath. His swelling has improved. He actually went to church yesterday - this did tire him out - but he felt like going. No chest pain. Back to his usual dose of diuretics today - has finished the Zaroxolyn and sounds like he took 3 days of zaroxolyn.   Past Medical History  Diagnosis Date  . Hypertension   . Arthritis   . Coronary artery disease   . 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 5 x 4.9, proximal descending thoracic aortic aorta measuring 5.8 x 5.1, the aortic 5.6 x 5.5 and distal thoracic aorta 3.9 x 4.1, His infrarenal aneurysm measured 3 x 2.7.  . Shortness of breath   . Aortic dissection ([place])   . Dysrhythmia     atrial fib  . Blood transfusion   . CHF, acute on chronic ([place]) 03/24/2011  . HOH (hard of hearing)     bilateral hearing aids  . Hx of echocardiogram 12/2011    EF 40% showed severe LA dilation, He had moderate concentric LHV. He has moderate inferior wall hypokinesis. There was evidence for pulmonary hypertension with an estimated RV systolic pressure at 62. He had moderate TR  . History of stress test 12/2011    Showed mild inferior thinning felt most likely due to attenuation artifact.    Past Surgical History  Procedure Laterality Date  . Replacement total knee      bilaterally  . Joint replacement      Bilateral knee  . Tonsillectomy    . Hernia repair    . Cardiac catheterization    . Coronary artery bypass graft  Feb 2011  . Ankle fusion  04/22/2012    Procedure: ARTHRODESIS ANKLE;  Surgeon: Wylene Simmer, MD;  Location:  [facility];  Service: Orthopedics;  Laterality: Left;  Left Ankle and Subtalar Arthrodesis      Medications: Current Outpatient Prescriptions  Medication Sig Dispense Refill  . amoxicillin (AMOXIL) 500 MG capsule TAKE 4 CAPSULES BY MOUTH 1 HOUR PRIOR TO DENTAL APPT  1  . Cholecalciferol (VITAMIN D) 2000 UNITS tablet Take 2,000 Units by mouth daily.    . digoxin (LANOXIN) 0.125 MG tablet Take 0.5 tablets (0.0625 mg total) by mouth daily. 90 tablet 1  . enalapril (VASOTEC) 20 MG tablet TAKE 1 TABLET TWICE A DAY (THIS REPLACES THE AMLODIPINE) 180 tablet 3  . furosemide (LASIX) 40 MG tablet Take 80 mg (2 tablets) in the morning and 80 mg (2 tablets) in the early afternoon. 90 tablet 6  . KLOR-CON M10 10 MEQ tablet TAKE 2 TABLETS BY MOUTH EVERY DAY 180 tablet 2  . Melatonin 10 MG TABS Take 1 tablet  by mouth at bedtime.    . metoprolol (LOPRESSOR) 50 MG tablet TAKE 1 TABLET (50 MG TOTAL) BY MOUTH 2 (TWO) TIMES DAILY. 180 tablet 3  . Multiple Vitamin (MULTIVITAMIN WITH MINERALS) TABS Take 1 tablet by mouth daily. Centrum Silver    . simvastatin (ZOCOR) 20 MG tablet TAKE 1 TABLET BY MOUTH AT BEDTIME. 90 tablet 3  . tamsulosin (FLOMAX) 0.4 MG CAPS capsule TAKE 1 CAPSULE (0.4 MG TOTAL) BY MOUTH DAILY. 90 capsule 3  . traMADol (ULTRAM) 50 MG tablet TAKE 1 TABLET BY MOUTH 3 TIMES A DAY AS NEEDED 90 tablet 0  . aspirin 81 MG tablet Take 81 mg by mouth daily.    . metolazone (ZAROXOLYN) 2.5 MG tablet Take 1 tablet (2.5 mg total) by mouth daily as needed. 1/2 hour before furosemide for excessive swelling in legs (Patient not taking: Reported on 02/26/2015) 30 tablet 6   No current facility-administered medications for this visit.    Allergies: No Known Allergies  Social History: The patient  reports that he quit smoking about 52 years ago. He has never used smokeless tobacco. He reports that he does not drink alcohol or use illicit drugs.   Family History: The patient's family history includes COPD in his brother; Hypertension in his father. There is no history of Diabetes, Cancer, Heart attack, or Stroke.   Review of Systems: Please see the history of present illness.   Otherwise, the review of systems is positive for none.   All other systems are reviewed and negative.   Physical Exam: VS:  BP 100/70 mmHg  Pulse 52  Ht 5\' 10"  (1.778 m)  Wt 236 lb (107.049 kg)  BMI 33.86 kg/m2  SpO2 99% .  BMI Body mass index is 33.86 kg/(m^2).  Wt Readings from Last 3 Encounters:  02/26/15 236 lb (107.049 kg)  02/21/15 244 lb 12.8 oz (111.041 kg)  12/25/14 230 lb (104.327 kg)    General: Pleasant. Elderly male, chronically ill appearing but in no acute distress. Weight is down 8 pounds HEENT: Normal. Neck: Supple, no JVD, carotid bruits, or masses noted.  Cardiac: Irregular irregular rhythm.  Rate is ok.  Harsh murmur. Swelling in the legs still present but improved.  Respiratory:  Lungs are coarse but fairly clear bilaterally with normal work of breathing.  GI: Soft and nontender.  MS: No deformity or atrophy. Gait and ROM intact.He is using a walker.  Skin: Warm and dry. Color is normal.  Neuro:  Strength and sensation are intact and no gross focal deficits noted.  Psych: Alert, appropriate and with  normal affect.   LABORATORY DATA:  EKG:  EKG is not ordered today.  Lab Results  Component Value Date   WBC 5.4 02/21/2015   HGB 11.1* 02/21/2015   HCT 33.4* 02/21/2015   PLT 191 02/21/2015   GLUCOSE 85 02/21/2015   CHOL 96 12/25/2014   TRIG 74.0 12/25/2014   HDL 43.30 12/25/2014   LDLCALC 38 12/25/2014   ALT 17 02/21/2015   AST 29 02/21/2015   NA 137 02/21/2015   K 3.7 02/21/2015   CL 99 02/21/2015   CREATININE 1.03 02/21/2015   BUN 25 02/21/2015   CO2 30 02/21/2015   TSH 1.730 02/10/2014   INR 1.62* 06/14/2009   HGBA1C  06/13/2009    5.7 (NOTE) The ADA recommends the following therapeutic goal for glycemic control related to Hgb A1c measurement: Goal of therapy: <6.5 Hgb A1c  Reference: American Diabetes Association: Clinical Practice Recommendations 2010, Diabetes Care, 2010, 33: (Suppl  1).    BNP (last 3 results) No results for input(s): BNP in the last 8760 hours.  ProBNP (last 3 results)  Recent Labs  09/19/14 1710  PROBNP 527.0*     Other Studies Reviewed Today:  Preliminary echo with EF 30 to 35%, severe TR.   Assessment/Plan: 1. Acute on chronic systolic HF - NYHA III/IV - he has improved with additional diuresis. Will leave him on 80 mg of Lasix BID. See back in one week. No more zaroxolyn for now. Recheck his labs. Preliminary echo with EF unchanged but severe TR. Final reading pending.   2. Chronic atrial fib - his rate is controlled.   3. Long standing type III aortic dissection with no plans for surgical intervention  4. CAD with  prior CABG - no active chest pain.   5. HTN - BP actually soft. Off Norvasc now.  He is asymptomatic.   Current medicines are reviewed with the patient today.  The patient does not have concerns regarding medicines other than what has been noted above.  The following changes have been made:  See above.  Labs/ tests ordered today include:    Orders Placed This Encounter  Procedures  . Brain natriuretic peptide  . Basic metabolic panel     Disposition:   See me in one week.   Patient is agreeable to this plan and will call if any problems develop in the interim.   Signed: Burtis Junes, RN, ANP-C 02/26/2015 11:53 AM  [facility] [address] [place], [place]  [address] Phone: [phone] Fax: [phone]

## 2015-02-27 ENCOUNTER — Other Ambulatory Visit: Payer: Self-pay | Admitting: Cardiovascular Disease

## 2015-02-27 ENCOUNTER — Telehealth: Payer: Self-pay | Admitting: *Deleted

## 2015-02-27 LAB — BRAIN NATRIURETIC PEPTIDE: Brain Natriuretic Peptide: 569.8 pg/mL — ABNORMAL HIGH (ref 0.0–100.0)

## 2015-02-27 NOTE — Telephone Encounter (Signed)
Rx(s) sent to pharmacy electronically.  

## 2015-02-27 NOTE — Telephone Encounter (Signed)
S/w pt's daughter in law is agreeable to plan pt will stay on increase dose of potassium tid until ov on Monday.  Still waiting for bnp to be resulted

## 2015-03-01 ENCOUNTER — Ambulatory Visit: Payer: Medicare Other | Admitting: Podiatry

## 2015-03-05 ENCOUNTER — Encounter: Payer: Self-pay | Admitting: Nurse Practitioner

## 2015-03-05 ENCOUNTER — Ambulatory Visit (INDEPENDENT_AMBULATORY_CARE_PROVIDER_SITE_OTHER): Payer: Medicare Other | Admitting: Nurse Practitioner

## 2015-03-05 VITALS — BP 120/64 | HR 69 | Ht 70.0 in | Wt 240.2 lb

## 2015-03-05 DIAGNOSIS — I482 Chronic atrial fibrillation, unspecified: Secondary | ICD-10-CM

## 2015-03-05 DIAGNOSIS — I5041 Acute combined systolic (congestive) and diastolic (congestive) heart failure: Secondary | ICD-10-CM

## 2015-03-05 DIAGNOSIS — I255 Ischemic cardiomyopathy: Secondary | ICD-10-CM | POA: Diagnosis not present

## 2015-03-05 DIAGNOSIS — I272 Other secondary pulmonary hypertension: Secondary | ICD-10-CM

## 2015-03-05 DIAGNOSIS — I10 Essential (primary) hypertension: Secondary | ICD-10-CM | POA: Diagnosis not present

## 2015-03-05 DIAGNOSIS — Z23 Encounter for immunization: Secondary | ICD-10-CM | POA: Diagnosis not present

## 2015-03-05 LAB — BRAIN NATRIURETIC PEPTIDE: Brain Natriuretic Peptide: 1405.6 pg/mL — ABNORMAL HIGH (ref 0.0–100.0)

## 2015-03-05 LAB — BASIC METABOLIC PANEL
BUN: 40 mg/dL — ABNORMAL HIGH (ref 7–25)
CO2: 27 mmol/L (ref 20–31)
Calcium: 9.8 mg/dL (ref 8.6–10.3)
Chloride: 101 mmol/L (ref 98–110)
Creat: 1.33 mg/dL — ABNORMAL HIGH (ref 0.70–1.11)
Glucose, Bld: 73 mg/dL (ref 65–99)
Potassium: 4.6 mmol/L (ref 3.5–5.3)
Sodium: 137 mmol/L (ref 135–146)

## 2015-03-05 NOTE — Progress Notes (Signed)
CARDIOLOGY OFFICE NOTE  Date:  03/05/2015    Lee Gutierrez Date of Birth: 1930/07/26 Medical Record #[id number]  PCP:  Viviana Simpler, MD  Cardiologist:  Claiborne Billings    Chief Complaint  Patient presents with  . Congestive Heart Failure    One week check - seen for Dr. Claiborne Billings    History of Present Illness: Lee Gutierrez is a 81 y.o. male who presents today for a one week check. Seen for Dr. Claiborne Billings. He has a history of CAD with prior CABG in 2011 with LIMA to LAD, SVG to marginal, SVG to 2nd DX. He has chronic atrial fib. 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 Cyndia Bent. Because of the aneurysm and type III aortic dissection is only on aspirin rather than full dose anticoagulation. He is felt not to be a candidate for open surgical repair by Dr. Cyndia Bent and that the aorta proximally and distally is probably too large to allow staff stent graft placement with the currently available stent graft. For this reason, continued medical therapy was recommended.  He was last seen by Dr. Claiborne Billings back in June.   Phone call earlier this month in regards to weight gain - I have seen him back several times this month for worsening heart failure. He has wanted to try and be managed at home. Echo updated to help guide his care - see below.   Comes in today. Here with his son today. Weight has fluctuated. He did eat some [place] this past week. Has visited with his grand kids. Wanting a flu shot. He still has dyspnea on exertion with very little activity. No chest pain. Reviewed his echo with both he and his son - they seem to understand the seriousness of this situation - continue to opt for conservative approach. Not really dizzy. No falls. Swelling is softer.   Past Medical History  Diagnosis Date  . Hypertension   . Arthritis   . Coronary artery disease   . 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 5 x 4.9, proximal descending thoracic aortic aorta measuring 5.8 x 5.1, the aortic 5.6 x 5.5 and distal thoracic aorta 3.9 x 4.1, His infrarenal aneurysm measured 3 x 2.7.  . Shortness of breath   . Aortic dissection ([place])   . Dysrhythmia     atrial fib  . Blood transfusion   . CHF, acute on chronic ([place]) 03/24/2011  . HOH (hard of hearing)     bilateral hearing aids  . Hx of echocardiogram 12/2011    EF 40% showed severe LA dilation, He had moderate concentric LHV. He has moderate inferior wall hypokinesis. There was evidence for pulmonary hypertension with an estimated RV systolic pressure at 62. He had moderate TR  . History of stress test 12/2011    Showed mild inferior thinning felt most likely due to attenuation artifact.    Past Surgical History  Procedure Laterality Date  . Replacement total knee      bilaterally  . Joint replacement      Bilateral knee  . Tonsillectomy    . Hernia repair    . Cardiac catheterization    . Coronary artery bypass graft  Feb 2011  . Ankle fusion  04/22/2012    Procedure: ARTHRODESIS ANKLE;  Surgeon: Wylene Simmer, MD;  Location: [facility];  Service: Orthopedics;  Laterality: Left;  Left  Ankle and Subtalar Arthrodesis      Medications: Current Outpatient Prescriptions  Medication Sig Dispense Refill  . amoxicillin (AMOXIL) 500 MG capsule TAKE 4 CAPSULES BY MOUTH 1 HOUR PRIOR TO DENTAL APPT  1  . aspirin 81 MG tablet Take 81 mg by mouth daily.    . Cholecalciferol (VITAMIN D) 2000 UNITS tablet Take 2,000 Units by mouth daily.    . digoxin (LANOXIN) 0.125 MG tablet Take 0.5 tablets (0.0625 mg total) by mouth daily. 90 tablet 1  . enalapril (VASOTEC) 20 MG tablet TAKE 1 TABLET TWICE A DAY (THIS REPLACES THE AMLODIPINE) 180 tablet 3  . furosemide (LASIX) 40 MG tablet Take 80 mg (2 tablets) in the morning and 80 mg (2 tablets) in the early afternoon. 90 tablet 6  . KLOR-CON M10 10 MEQ tablet TAKE 2 TABLETS BY MOUTH EVERY DAY 180  tablet 3  . Melatonin 10 MG TABS Take 1 tablet by mouth at bedtime.    . metolazone (ZAROXOLYN) 2.5 MG tablet Take 1 tablet (2.5 mg total) by mouth daily as needed. 1/2 hour before furosemide for excessive swelling in legs 30 tablet 6  . metoprolol (LOPRESSOR) 50 MG tablet TAKE 1 TABLET (50 MG TOTAL) BY MOUTH 2 (TWO) TIMES DAILY. 180 tablet 3  . Multiple Vitamin (MULTIVITAMIN WITH MINERALS) TABS Take 1 tablet by mouth daily. Centrum Silver    . simvastatin (ZOCOR) 20 MG tablet TAKE 1 TABLET BY MOUTH AT BEDTIME. 90 tablet 3  . tamsulosin (FLOMAX) 0.4 MG CAPS capsule TAKE 1 CAPSULE (0.4 MG TOTAL) BY MOUTH DAILY. 90 capsule 3  . traMADol (ULTRAM) 50 MG tablet TAKE 1 TABLET BY MOUTH 3 TIMES A DAY AS NEEDED 90 tablet 0   No current facility-administered medications for this visit.    Allergies: No Known Allergies  Social History: The patient  reports that he quit smoking about 52 years ago. He has never used smokeless tobacco. He reports that he does not drink alcohol or use illicit drugs.   Family History: The patient's family history includes COPD in his brother; Hypertension in his father. There is no history of Diabetes, Cancer, Heart attack, or Stroke.   Review of Systems: Please see the history of present illness.   Otherwise, the review of systems is positive for chronic swelling. He does have some incontinence of stool.   All other systems are reviewed and negative.   Physical Exam: VS:  BP 120/64 mmHg  Pulse 69  Ht 5\' 10"  (1.778 m)  Wt 240 lb 3.2 oz (108.954 kg)  BMI 34.47 kg/m2  SpO2 98% .  BMI Body mass index is 34.47 kg/(m^2).  Wt Readings from Last 3 Encounters:  03/05/15 240 lb 3.2 oz (108.954 kg)  02/26/15 236 lb (107.049 kg)  02/21/15 244 lb 12.8 oz (111.041 kg)    General: Pleasant. He looks chronically ill but in no acute distress. Color remains pretty sallow.  HEENT: Normal. Neck: Supple, no JVD, carotid bruits, or masses noted.  Cardiac: Irregular irregular  rhythm. Rate ok. Systolic murmur noted. Legs with swelling - but seem softer. Weight is up 4 pounds over the past week.  Respiratory:  Lungs are clear to auscultation bilaterally with normal work of breathing.  GI: Soft and nontender.  MS: No deformity or atrophy. Gait and ROM intact. Using a walker.  Skin: Warm and dry. Color is normal.  Neuro:  Strength and sensation are intact and no gross focal deficits noted.  Psych: Alert, appropriate and with  normal affect.   LABORATORY DATA:  EKG:  EKG is not ordered today.  Lab Results  Component Value Date   WBC 5.4 02/21/2015   HGB 11.1* 02/21/2015   HCT 33.4* 02/21/2015   PLT 191 02/21/2015   GLUCOSE 87 02/26/2015   CHOL 96 12/25/2014   TRIG 74.0 12/25/2014   HDL 43.30 12/25/2014   LDLCALC 38 12/25/2014   ALT 17 02/21/2015   AST 29 02/21/2015   NA 135 02/26/2015   K 3.8 02/26/2015   CL 94* 02/26/2015   CREATININE 1.12* 02/26/2015   BUN 38* 02/26/2015   CO2 30 02/26/2015   TSH 1.730 02/10/2014   INR 1.62* 06/14/2009   HGBA1C  06/13/2009    5.7 (NOTE) The ADA recommends the following therapeutic goal for glycemic control related to Hgb A1c measurement: Goal of therapy: <6.5 Hgb A1c  Reference: American Diabetes Association: Clinical Practice Recommendations 2010, Diabetes Care, 2010, 33: (Suppl  1).    BNP (last 3 results) No results for input(s): BNP in the last 8760 hours.  ProBNP (last 3 results)  Recent Labs  09/19/14 1710  PROBNP 527.0*     Other Studies Reviewed Today:  Echo Study Conclusions from October 2016  - Left ventricle: The cavity size was moderately dilated. Wall thickness was increased in a pattern of mild LVH. Systolic function was moderately reduced. The estimated ejection fraction was in the range of 35% to 40%. Diffuse hypokinesis. The study is not technically sufficient to allow evaluation of LV diastolic function. - Ventricular septum: The contour showed diastolic flattening  and systolic flattening. - Aortic valve: Trileaflet. Sclerosis without stenosis. There was trivial regurgitation. - Aorta: The aorta was mildly calcified. Aortic root dimension: 40 mm (ED). Ascending aortic diameter: 49 mm (S). Distal descending thoracic aorta: 48.49 mm. - Ascending aorta: The ascending aorta is dilated. - Mitral valve: Mildly thickened leaflets . There was moderate regurgitation. - Left atrium: Massively dilated at 120 ml/m2. - Right ventricle: The cavity size was moderately dilated. Systolic function is reduced. - Right atrium: Severely dilated >50 cm2. - Tricuspid valve: There was severe regurgitation. - Pulmonary arteries: Dilated. PA peak pressure: 70 mm Hg (S). - Inferior vena cava: The vessel was dilated. The respirophasic diameter changes were blunted (< 50%), consistent with elevated central venous pressure. Reversal of flow in the hepatic veins is noted.  Impressions:  - LVEF 35-40%, moderately dilated wtih mild eccentric wall thickening, severe global hypokinesis, aortic aneurysm - measuring up to 4.9 cm in the ascending aorta and 4.9 cm in the proximal descending aorta, there is trivial AI, moderate (mostly functional) MR, severe TR with RVSP 70 mmHG, dilated IVC, massive biatrial enlargement, pressure overload of the RV is noted.  Assessment/Plan: 1. Acute on chronic systolic HF - NYHA III/IV - weight fluctuates. On 160 mg total of Lasix a day. Will add back Zaroxolyn but just twice a week - on Tuesday and Fridays. See back in 2 weeks. Lab today. Overall prognosis quite tenuous - they seem to understand.   2. Chronic atrial fib - his rate is controlled. Only on aspirin due to his aneurysm.   3. Long standing type III aortic dissection with no plans for surgical intervention - only on aspirin therapy.   4. CAD with prior CABG - no active chest pain.   5. HTN - remains off of Norvasc. BP ok.   Current medicines are  reviewed with the patient today.  The patient does not have concerns regarding medicines  other than what has been noted above.  The following changes have been made:  See above.  Labs/ tests ordered today include:    Orders Placed This Encounter  Procedures  . Brain natriuretic peptide  . Basic metabolic panel     Disposition:   FU with me in 2 weeks.   Patient is agreeable to this plan and will call if any problems develop in the interim.   Signed: Burtis Junes, RN, ANP-C 03/05/2015 11:10 AM  [facility] [address] [place], [place]  [address] Phone: [phone] Fax: [phone]

## 2015-03-05 NOTE — Patient Instructions (Addendum)
We will be checking the following labs today - BMET and BNP   Medication Instructions:    Continue with your current medicines.   Take the metolazone on Tuesday and Friday's only - take this about 30 minutes before the Lasix  Same dose of Lasix    Testing/Procedures To Be Arranged:  N/A  Follow-Up:   See me in 2 weeks.     Other Special Instructions:   N/A  Call the [place] office at [phone] if you have any questions, problems or concerns.

## 2015-03-06 NOTE — Addendum Note (Signed)
Addended by: Tamsen Snider on: 03/06/2015 08:58 AM   Modules accepted: Orders

## 2015-03-10 ENCOUNTER — Encounter: Payer: Self-pay | Admitting: Nurse Practitioner

## 2015-03-10 ENCOUNTER — Other Ambulatory Visit: Payer: Self-pay | Admitting: Internal Medicine

## 2015-03-12 ENCOUNTER — Other Ambulatory Visit: Payer: Self-pay | Admitting: *Deleted

## 2015-03-12 MED ORDER — FUROSEMIDE 40 MG PO TABS: ORAL_TABLET | ORAL | Status: DC

## 2015-03-14 ENCOUNTER — Encounter: Payer: Self-pay | Admitting: Nurse Practitioner

## 2015-03-17 ENCOUNTER — Encounter: Payer: Self-pay | Admitting: Nurse Practitioner

## 2015-03-19 ENCOUNTER — Encounter: Payer: Self-pay | Admitting: Nurse Practitioner

## 2015-03-19 ENCOUNTER — Ambulatory Visit (INDEPENDENT_AMBULATORY_CARE_PROVIDER_SITE_OTHER): Payer: Medicare Other | Admitting: Nurse Practitioner

## 2015-03-19 ENCOUNTER — Telehealth: Payer: Self-pay | Admitting: Nurse Practitioner

## 2015-03-19 VITALS — BP 120/80 | HR 79 | Ht 70.0 in | Wt 229.2 lb

## 2015-03-19 DIAGNOSIS — I2510 Atherosclerotic heart disease of native coronary artery without angina pectoris: Secondary | ICD-10-CM | POA: Diagnosis not present

## 2015-03-19 DIAGNOSIS — I5043 Acute on chronic combined systolic (congestive) and diastolic (congestive) heart failure: Secondary | ICD-10-CM | POA: Diagnosis not present

## 2015-03-19 DIAGNOSIS — I482 Chronic atrial fibrillation, unspecified: Secondary | ICD-10-CM

## 2015-03-19 DIAGNOSIS — R0600 Dyspnea, unspecified: Secondary | ICD-10-CM | POA: Diagnosis not present

## 2015-03-19 DIAGNOSIS — I255 Ischemic cardiomyopathy: Secondary | ICD-10-CM

## 2015-03-19 LAB — BASIC METABOLIC PANEL
BUN: 43 mg/dL — ABNORMAL HIGH (ref 7–25)
CO2: 30 mmol/L (ref 20–31)
Calcium: 9.3 mg/dL (ref 8.6–10.3)
Chloride: 99 mmol/L (ref 98–110)
Creat: 1.2 mg/dL — ABNORMAL HIGH (ref 0.70–1.11)
Glucose, Bld: 89 mg/dL (ref 65–99)
Potassium: 4.1 mmol/L (ref 3.5–5.3)
Sodium: 139 mmol/L (ref 135–146)

## 2015-03-19 LAB — BRAIN NATRIURETIC PEPTIDE: Brain Natriuretic Peptide: 1620.5 pg/mL — ABNORMAL HIGH (ref 0.0–100.0)

## 2015-03-19 NOTE — Progress Notes (Signed)
CARDIOLOGY OFFICE NOTE  Date:  03/19/2015    [name] Date of Birth: 18-Feb-1931 Medical Record #[id number]  PCP:  Viviana Simpler, MD  Cardiologist:  Claiborne Billings    Chief Complaint  Patient presents with  . Congestive Heart Failure    Follow up visit - seen for Dr. Claiborne Billings    History of Present Illness: KINGSLY KLOEPFER is a 81 y.o. male who presents today for a 2 week check. Seen for Dr. Claiborne Billings. He has a history of CAD with prior CABG in 2011 with LIMA to LAD, SVG to marginal, SVG to 2nd DX. He has chronic atrial fib. 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 Cyndia Bent. Because of the aneurysm and type III aortic dissection is only on aspirin rather than full dose anticoagulation. He is felt not to be a candidate for open surgical repair by Dr. Cyndia Bent and that the aorta proximally and distally is probably too large to allow staff stent graft placement with the currently available stent graft. For this reason, continued medical therapy was recommended.  He was last seen by Dr. Claiborne Billings back in June.   Phone call back in October in regards to weight gain - I have seen him back several times since for worsening heart failure. He has wanted to try and be managed at home. Echo updated to help guide his care - see below. Last seen 2 weeks ago - was a little better clinically. Echo reviewed with him and son. They understand this is a tenuous situation - will try to keep managing conservatively. Now on Zaroxolyn just a couple of times a week with his higher dose of Lasix. Have also stopped his Norvasc due to lower BP's.   Comes in today. Here with his son today. Weight is down considerably today - down 11 pounds over the past 2 weeks - this is with Zaroxoly twice a week only. He continues to go to [employer] each day. He is taking 6 potassiums a day - not sure why - I did not recommend this - now needs refill. His swelling  has really improved. Belly much softer. Short of breath at times but seems improved. No chest pain.   Past Medical History  Diagnosis Date  . Hypertension   . Arthritis   . Coronary artery disease   . 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 5 x 4.9, proximal descending thoracic aortic aorta measuring 5.8 x 5.1, the aortic 5.6 x 5.5 and distal thoracic aorta 3.9 x 4.1, His infrarenal aneurysm measured 3 x 2.7.  . Shortness of breath   . Aortic dissection ([facility])   . Dysrhythmia     atrial fib  . Blood transfusion   . CHF, acute on chronic ([facility]) 03/24/2011  . HOH (hard of hearing)     bilateral hearing aids  . Hx of echocardiogram 12/2011    EF 40% showed severe LA dilation, He had moderate concentric LHV. He has moderate inferior wall hypokinesis. There was evidence for pulmonary hypertension with an estimated RV systolic pressure at 62. He had moderate TR  . History of stress test 12/2011    Showed mild inferior thinning felt most likely due to attenuation artifact.    Past Surgical History  Procedure Laterality Date  . Replacement total knee      bilaterally  . Joint replacement  Bilateral knee  . Tonsillectomy    . Hernia repair    . Cardiac catheterization    . Coronary artery bypass graft  Feb 2011  . Ankle fusion  04/22/2012    Procedure: ARTHRODESIS ANKLE;  Surgeon: Wylene Simmer, MD;  Location: [facility];  Service: Orthopedics;  Laterality: Left;  Left Ankle and Subtalar Arthrodesis      Medications: Current Outpatient Prescriptions  Medication Sig Dispense Refill  . amoxicillin (AMOXIL) 500 MG capsule TAKE 4 CAPSULES BY MOUTH 1 HOUR PRIOR TO DENTAL APPT  1  . aspirin 81 MG tablet Take 81 mg by mouth daily.    . chlorhexidine (PERIDEX) 0.12 % solution RINSE FOR 30 SECONDS WITH 1/2 OUNCE TWICE A DAY  12  . Cholecalciferol (VITAMIN D) 2000 UNITS tablet Take 2,000 Units by mouth daily.    . digoxin (LANOXIN) 0.125 MG tablet Take 0.5  tablets (0.0625 mg total) by mouth daily. 90 tablet 1  . enalapril (VASOTEC) 20 MG tablet TAKE 1 TABLET TWICE A DAY (THIS REPLACES THE AMLODIPINE) 180 tablet 3  . furosemide (LASIX) 40 MG tablet Take 80 mg (2 tablets) in the morning and 80 mg (2 tablets) in the early afternoon. 360 tablet 0  . KLOR-CON M10 10 MEQ tablet TAKE 2 TABLETS BY MOUTH EVERY DAY (Patient taking differently: TAKE 4 TABLETS BY MOUTH EVERY DAY) 180 tablet 3  . Melatonin 10 MG TABS Take 1 tablet by mouth at bedtime.    . metolazone (ZAROXOLYN) 2.5 MG tablet Take 1 tablet (2.5 mg total) by mouth daily as needed. 1/2 hour before furosemide for excessive swelling in legs 30 tablet 6  . metoprolol (LOPRESSOR) 50 MG tablet TAKE 1 TABLET (50 MG TOTAL) BY MOUTH 2 (TWO) TIMES DAILY. 180 tablet 3  . Multiple Vitamin (MULTIVITAMIN WITH MINERALS) TABS Take 1 tablet by mouth daily. Centrum Silver    . simvastatin (ZOCOR) 20 MG tablet TAKE 1 TABLET BY MOUTH AT BEDTIME. 90 tablet 3  . tamsulosin (FLOMAX) 0.4 MG CAPS capsule TAKE 1 CAPSULE (0.4 MG TOTAL) BY MOUTH DAILY. 90 capsule 3  . traMADol (ULTRAM) 50 MG tablet TAKE 1 TABLET BY MOUTH 3 TIMES A DAY AS NEEDED 90 tablet 0   No current facility-administered medications for this visit.    Allergies: No Known Allergies  Social History: The patient  reports that he quit smoking about 52 years ago. He has never used smokeless tobacco. He reports that he does not drink alcohol or use illicit drugs.   Family History: The patient's family history includes COPD in his brother; Hypertension in his father. There is no history of Diabetes, Cancer, Heart attack, or Stroke.   Review of Systems: Please see the history of present illness.   Otherwise, the review of systems is positive for none.   All other systems are reviewed and negative.   Physical Exam: VS:  BP 120/80 mmHg  Pulse 79  Ht 5\' 10"  (1.778 m)  Wt 229 lb 3.2 oz (103.964 kg)  BMI 32.89 kg/m2  SpO2 97% .  BMI Body mass index is  32.89 kg/(m^2).  Wt Readings from Last 3 Encounters:  03/19/15 229 lb 3.2 oz (103.964 kg)  03/05/15 240 lb 3.2 oz (108.954 kg)  02/26/15 236 lb (107.049 kg)    General: Pleasant. Elderly male - looks chronically ill but in no acute distress.  HEENT: Normal. Neck: Supple, no JVD, carotid bruits, or masses noted.  Cardiac: Irregular irregular rhythm. Rate is ok.  Outflow  murmur noted. No edema in his legs and his belly is much softer.  Respiratory:  Lungs are clear to auscultation bilaterally with normal work of breathing.  GI: Soft and nontender.  MS: No deformity or atrophy. Gait and ROM intact. Using a walker.  Skin: Warm and dry. Color is normal.  Neuro:  Strength and sensation are intact and no gross focal deficits noted.  Psych: Alert, appropriate and with normal affect.   LABORATORY DATA:  EKG:  EKG is not ordered today.  Lab Results  Component Value Date   WBC 5.4 02/21/2015   HGB 11.1* 02/21/2015   HCT 33.4* 02/21/2015   PLT 191 02/21/2015   GLUCOSE 73 03/05/2015   CHOL 96 12/25/2014   TRIG 74.0 12/25/2014   HDL 43.30 12/25/2014   LDLCALC 38 12/25/2014   ALT 17 02/21/2015   AST 29 02/21/2015   NA 137 03/05/2015   K 4.6 03/05/2015   CL 101 03/05/2015   CREATININE 1.33* 03/05/2015   BUN 40* 03/05/2015   CO2 27 03/05/2015   TSH 1.730 02/10/2014   INR 1.62* 06/14/2009   HGBA1C  06/13/2009    5.7 (NOTE) The ADA recommends the following therapeutic goal for glycemic control related to Hgb A1c measurement: Goal of therapy: <6.5 Hgb A1c  Reference: American Diabetes Association: Clinical Practice Recommendations 2010, Diabetes Care, 2010, 33: (Suppl  1).    BNP (last 3 results) No results for input(s): BNP in the last 8760 hours.  ProBNP (last 3 results)  Recent Labs  09/19/14 1710  PROBNP 527.0*     Other Studies Reviewed Today:  Echo Study Conclusions from October 2016  - Left ventricle: The cavity size was moderately dilated. Wall thickness was  increased in a pattern of mild LVH. Systolic function was moderately reduced. The estimated ejection fraction was in the range of 35% to 40%. Diffuse hypokinesis. The study is not technically sufficient to allow evaluation of LV diastolic function. - Ventricular septum: The contour showed diastolic flattening and systolic flattening. - Aortic valve: Trileaflet. Sclerosis without stenosis. There was trivial regurgitation. - Aorta: The aorta was mildly calcified. Aortic root dimension: 40 mm (ED). Ascending aortic diameter: 49 mm (S). Distal descending thoracic aorta: 48.49 mm. - Ascending aorta: The ascending aorta is dilated. - Mitral valve: Mildly thickened leaflets . There was moderate regurgitation. - Left atrium: Massively dilated at 120 ml/m2. - Right ventricle: The cavity size was moderately dilated. Systolic function is reduced. - Right atrium: Severely dilated >50 cm2. - Tricuspid valve: There was severe regurgitation. - Pulmonary arteries: Dilated. PA peak pressure: 70 mm Hg (S). - Inferior vena cava: The vessel was dilated. The respirophasic diameter changes were blunted (< 50%), consistent with elevated central venous pressure. Reversal of flow in the hepatic veins is noted.  Impressions:  - LVEF 35-40%, moderately dilated wtih mild eccentric wall thickening, severe global hypokinesis, aortic aneurysm - measuring up to 4.9 cm in the ascending aorta and 4.9 cm in the proximal descending aorta, there is trivial AI, moderate (mostly functional) MR, severe TR with RVSP 70 mmHG, dilated IVC, massive biatrial enlargement, pressure overload of the RV is noted.  Assessment/Plan: 1. Acute on chronic systolic HF - NYHA III/IV - weight is down considerably. He is tolerating his current regimen very well - not dizzy or lightheaded. On 160 mg total of Lasix a day and Zaroxolyn just twice a week - on Tuesday and Fridays. Lab today - may need to cut  the potassium back. Overall  prognosis quite tenuous but he seems to be holding his own - they seem to understand.   2. Chronic atrial fib - his rate is controlled. Only on aspirin due to his aneurysm.   3. Long standing type III aortic dissection with no plans for surgical intervention - only on aspirin therapy.   4. CAD with prior CABG - no active chest pain.   5. HTN - remains off of Norvasc. BP ok  Current medicines are reviewed with the patient today.  The patient does not have concerns regarding medicines other than what has been noted above.  The following changes have been made:  See above.  Labs/ tests ordered today include:    Orders Placed This Encounter  Procedures  . Brain natriuretic peptide  . Basic metabolic panel     Disposition:   FU with Dr. Claiborne Billings as planned next month.  Patient is agreeable to this plan and will call if any problems develop in the interim.   Signed: Burtis Junes, RN, ANP-C 03/19/2015 10:55 AM  [facility] [address] [place], [place]  [address] Phone: [phone] Fax: [phone]

## 2015-03-19 NOTE — Patient Instructions (Addendum)
We will be checking the following labs today - BMET and BNP   Medication Instructions:    Continue with your current medicines.     Testing/Procedures To Be Arranged:  N/A  Follow-Up:   See Dr. Claiborne Billings in one month as planned.     Other Special Instructions:   N/A    If you need a refill on your cardiac medications before your next appointment, please call your pharmacy.   Call the [facility] office at [phone] if you have any questions, problems or concerns.

## 2015-03-19 NOTE — Telephone Encounter (Signed)
F/u ° ° ° °Pt's daughter returning Danielle's call. °

## 2015-03-20 ENCOUNTER — Other Ambulatory Visit: Payer: Self-pay | Admitting: *Deleted

## 2015-03-20 MED ORDER — POTASSIUM CHLORIDE CRYS ER 20 MEQ PO TBCR: 20.0000 meq | EXTENDED_RELEASE_TABLET | Freq: Three times a day (TID) | ORAL | Status: DC

## 2015-03-23 ENCOUNTER — Other Ambulatory Visit: Payer: Self-pay | Admitting: Cardiovascular Disease

## 2015-03-24 ENCOUNTER — Other Ambulatory Visit: Payer: Self-pay | Admitting: Cardiovascular Disease

## 2015-04-11 ENCOUNTER — Other Ambulatory Visit: Payer: Self-pay | Admitting: *Deleted

## 2015-04-11 ENCOUNTER — Telehealth: Payer: Self-pay | Admitting: *Deleted

## 2015-04-11 ENCOUNTER — Other Ambulatory Visit (INDEPENDENT_AMBULATORY_CARE_PROVIDER_SITE_OTHER): Payer: Medicare Other | Admitting: *Deleted

## 2015-04-11 ENCOUNTER — Encounter: Payer: Self-pay | Admitting: Nurse Practitioner

## 2015-04-11 DIAGNOSIS — R0602 Shortness of breath: Secondary | ICD-10-CM

## 2015-04-11 DIAGNOSIS — I5041 Acute combined systolic (congestive) and diastolic (congestive) heart failure: Secondary | ICD-10-CM

## 2015-04-11 DIAGNOSIS — I482 Chronic atrial fibrillation, unspecified: Secondary | ICD-10-CM

## 2015-04-11 NOTE — Telephone Encounter (Signed)
S/w pt's daughter per DPR pt will go to [place] location at  [facility], [address] and will have a BNP/BMET drawn today @ 3:00 pm.  Pt will also d/c metalazone.  Pt's daughter verbally agreed with plan. Left detail message on pt's daughter's phone per daughter with time and address of location.

## 2015-04-12 LAB — BASIC METABOLIC PANEL
BUN/Creatinine Ratio: 40 — ABNORMAL HIGH (ref 10–22)
BUN: 52 mg/dL — ABNORMAL HIGH (ref 8–27)
CO2: 24 mmol/L (ref 18–29)
Calcium: 9.4 mg/dL (ref 8.6–10.2)
Chloride: 96 mmol/L — ABNORMAL LOW (ref 97–106)
Creatinine, Ser: 1.29 mg/dL — ABNORMAL HIGH (ref 0.76–1.27)
GFR calc Af Amer: 59 mL/min/{1.73_m2} — ABNORMAL LOW (ref >59)
GFR calc non Af Amer: 51 mL/min/{1.73_m2} — ABNORMAL LOW (ref >59)
Glucose: 93 mg/dL (ref 65–99)
Potassium: 4 mmol/L (ref 3.5–5.2)
Sodium: 137 mmol/L (ref 136–144)

## 2015-04-12 LAB — BRAIN NATRIURETIC PEPTIDE: BNP: 524.3 pg/mL — ABNORMAL HIGH (ref 0.0–100.0)

## 2015-04-19 ENCOUNTER — Other Ambulatory Visit: Payer: Self-pay | Admitting: Cardiovascular Disease

## 2015-04-19 ENCOUNTER — Ambulatory Visit (INDEPENDENT_AMBULATORY_CARE_PROVIDER_SITE_OTHER): Payer: Medicare Other | Admitting: Cardiovascular Disease

## 2015-04-19 ENCOUNTER — Encounter: Payer: Self-pay | Admitting: Cardiovascular Disease

## 2015-04-19 VITALS — BP 112/68 | HR 56 | Ht 70.0 in | Wt 217.0 lb

## 2015-04-19 DIAGNOSIS — I2510 Atherosclerotic heart disease of native coronary artery without angina pectoris: Secondary | ICD-10-CM

## 2015-04-19 DIAGNOSIS — I712 Thoracic aortic aneurysm, without rupture, unspecified: Secondary | ICD-10-CM

## 2015-04-19 DIAGNOSIS — E785 Hyperlipidemia, unspecified: Secondary | ICD-10-CM

## 2015-04-19 DIAGNOSIS — I10 Essential (primary) hypertension: Secondary | ICD-10-CM | POA: Diagnosis not present

## 2015-04-19 DIAGNOSIS — I482 Chronic atrial fibrillation, unspecified: Secondary | ICD-10-CM

## 2015-04-19 DIAGNOSIS — I255 Ischemic cardiomyopathy: Secondary | ICD-10-CM

## 2015-04-19 NOTE — Patient Instructions (Addendum)
Your physician wants you to follow-up in: 6 months or sooner if needed. You will receive a reminder letter in the mail two months in advance. If you don't receive a letter, please call our office to schedule the follow-up appointment.   If you need a refill on your cardiac medications before your next appointment, please call your pharmacy. 

## 2015-04-19 NOTE — Telephone Encounter (Signed)
REFILL 

## 2015-04-19 NOTE — Progress Notes (Signed)
Patient ID: RASHEE MARSCHALL, male   DOB: 13-Mar-1931, 81 y.o.   MRN: [id number]    HPI: JAMICAH ANSTEAD is a 81 y.o. male who presents for a 6 month cardiology evaluation.   Mr. 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 Cyndia Bent. In February 2011 he underwent CABG surgery for severe CAD after catheterization by me revealed high grade left main stenosis. The LIMA was placed to the LAD, vein to the marginal, vein to the second diagonal. A CT scan in April 2013 showed a 5 x 4.9 proximal descending thoracic aneurysm in the descending thoracic aneurysm measured 5.8 x 5.1 and at the aortic arch 5.6 x 5.5 and the distal thoracic aorta at 3.9 x 4.1. In December 2013 he underwent left ankle surgery and tolerated this well from a cardiovascular standpoint. His wife was diagnosed with glioblastoma last year, and unfortunately  she died in 03/11/13 at age 55.  He had been living by himself, but will be moving intermittent with his son.  He has history of permanent atrial fibrillation. Because of the aneurysm and type III aortic dissection is only on aspirin rather than full dose anticoagulation.  He underwent a one-year follow-up evaluation with Dr. Cyndia Bent for his chronic type B aortic dissection in April 2016.Marland Kitchen  His most recent CT scan showed his diameter had increased to 5.9 cm, which had increased from 5.7 cm, and has enlarged 5 mm since 2011, which was felt to be a relatively small amount.  He is felt not to be a candidate for open surgical repair by Dr.Bartle and that the aorta proximally and distally is probably too large to allow  stent graft placement with the currently available stent graft.  For this reason, continued medical therapy was recommended.  When I had last seen him, he had gained approximately 20 pounds of fluid after he had gone on vacation and undoubtedly had increased sodium intake.  He was treated with  increased Lasix and was evaluated by [name] in the office.  He initially diuresed well.  Since I last saw him, he has been seen by Remer Macho on several occasions.  There was a weight gain of approximately 25 pounds due predominantly to fluid overload.  He was treated with increasing diuresis and transiently had received metolazone other day and then 2 days per week.  His last dose of metolazone was 2 weeks ago.  Follow-up chemistry profile on 04/11/2015 revealed a creatinine of 1.29 with a potassium of 4.0.  He presents now for follow-up evaluation.  Past Medical History  Diagnosis Date  . Hypertension   . Arthritis   . Coronary artery disease   . 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 5 x 4.9, proximal descending thoracic aortic aorta measuring 5.8 x 5.1, the aortic 5.6 x 5.5 and distal thoracic aorta 3.9 x 4.1, His infrarenal aneurysm measured 3 x 2.7.  . Shortness of breath   . Aortic dissection ([place])   . Dysrhythmia     atrial fib  . Blood transfusion   . CHF, acute on chronic ([place]) 03/24/2011  . HOH (hard of hearing)     bilateral hearing aids  . Hx of echocardiogram 12/2011    EF 40% showed severe LA dilation, He had moderate concentric LHV. He has moderate inferior wall hypokinesis. There was evidence for pulmonary hypertension with an estimated RV systolic pressure  at 83. He had moderate TR  . History of stress test 12/2011    Showed mild inferior thinning felt most likely due to attenuation artifact.    Past Surgical History  Procedure Laterality Date  . Replacement total knee      bilaterally  . Joint replacement      Bilateral knee  . Tonsillectomy    . Hernia repair    . Cardiac catheterization    . Coronary artery bypass graft  Feb 2011  . Ankle fusion  04/22/2012    Procedure: ARTHRODESIS ANKLE;  Surgeon: Wylene Simmer, MD;  Location: [facility];  Service: Orthopedics;  Laterality: Left;  Left Ankle and Subtalar Arthrodesis      No Known Allergies  Current Outpatient Prescriptions  Medication Sig Dispense Refill  . amoxicillin (AMOXIL) 500 MG capsule TAKE 4 CAPSULES BY MOUTH 1 HOUR PRIOR TO DENTAL APPT  1  . aspirin 81 MG tablet Take 81 mg by mouth daily.    . chlorhexidine (PERIDEX) 0.12 % solution RINSE FOR 30 SECONDS WITH 1/2 OUNCE TWICE A DAY  12  . Cholecalciferol (VITAMIN D) 2000 UNITS tablet Take 2,000 Units by mouth daily.    . enalapril (VASOTEC) 20 MG tablet Take 1 tablet (20 mg total) by mouth 2 (two) times daily. 180 tablet 3  . furosemide (LASIX) 40 MG tablet Take 80 mg (2 tablets) in the morning and 80 mg (2 tablets) in the early afternoon. 360 tablet 0  . Melatonin 10 MG TABS Take 1 tablet by mouth at bedtime.    . metoprolol (LOPRESSOR) 50 MG tablet TAKE 1 TABLET (50 MG TOTAL) BY MOUTH 2 (TWO) TIMES DAILY. 180 tablet 3  . Multiple Vitamin (MULTIVITAMIN WITH MINERALS) TABS Take 1 tablet by mouth daily. Centrum Silver    . potassium chloride SA (K-DUR,KLOR-CON) 20 MEQ tablet Take 1 tablet (20 mEq total) by mouth 3 (three) times daily. 90 tablet 3  . simvastatin (ZOCOR) 20 MG tablet TAKE 1 TABLET BY MOUTH AT BEDTIME. 90 tablet 3  . tamsulosin (FLOMAX) 0.4 MG CAPS capsule TAKE 1 CAPSULE (0.4 MG TOTAL) BY MOUTH DAILY. 90 capsule 3  . traMADol (ULTRAM) 50 MG tablet TAKE 1 TABLET BY MOUTH 3 TIMES A DAY AS NEEDED 90 tablet 0  . digoxin (LANOXIN) 0.125 MG tablet TAKE 1/2 TABLET (0.0625MG) BY MOUTH ONCE DAILY 90 tablet 1   No current facility-administered medications for this visit.    Social History   Social History  . Marital Status: Widowed    Spouse Name: N/A  . Number of Children: 3  . Years of Education: N/A   Occupational History  . retired Architect    Social History Main Topics  . Smoking status: Former Smoker    Quit date: 02/04/1963  . Smokeless tobacco: Never Used  . Alcohol Use: No  . Drug Use: No  . Sexual Activity: Not Currently   Other Topics Concern  . Not on file    Social History Narrative   Has living will.   Daughter [name] has health care POA    Would want resuscitation attempts but no prolonged artificial ventilation.   Would not want tube feedings if cognitively aware    Family History  Problem Relation Age of Onset  . Hypertension Father   . COPD Brother   . Diabetes Neg Hx   . Cancer Neg Hx   . Heart attack Neg Hx   . Stroke Neg Hx    Socially he is  recently widowed and has 3 children 7 grandchildren. He is originally from the [place] area. He now lives in [place]. There is no tobacco or alcohol use.  Since I last saw him, he is moved in with his son and his wife to their house and is no longer living by himself.  ROS General: Negative; No fevers, chills, or night sweats;  HEENT: Negative; No changes in vision or hearing, sinus congestion, difficulty swallowing Pulmonary: Negative; No cough, wheezing, shortness of breath, hemoptysis Cardiovascular: See history of present illness GI: Negative; No nausea, vomiting, diarrhea, or abdominal pain GU: Negative; No dysuria, hematuria, or difficulty voiding Musculoskeletal: Negative; no myalgias, joint pain, or weakness Hematologic/Oncology: Negative; no easy bruising, bleeding Endocrine: Negative; no heat/cold intolerance; no diabetes Neuro: Negative; no changes in balance, headaches Skin: Negative; No rashes or skin lesions Psychiatric: Negative; No behavioral problems, depression Sleep: Negative; No snoring, daytime sleepiness, hypersomnolence, bruxism, restless legs, hypnogognic hallucinations, no cataplexy Other comprehensive 14 point system review is negative.  PE BP 112/68 mmHg  Pulse 56  Ht '5\' 10"'  (1.778 m)  Wt 217 lb (98.431 kg)  BMI 31.14 kg/m2   Wt Readings from Last 3 Encounters:  04/19/15 217 lb (98.431 kg)  03/19/15 229 lb 3.2 oz (103.964 kg)  03/05/15 240 lb 3.2 oz (108.954 kg)   General: Alert, oriented, no distress.  Skin: normal turgor, no rashes HEENT:  Normocephalic, atraumatic. Pupils round and reactive; sclera anicteric;no lid lag.  Nose without nasal septal hypertrophy; no xanthelasmas Mouth/Parynx benign; Mallinpatti scale 3 Neck: No JVD, no carotid bruits Lungs: clear to ausculatation and percussion; no wheezing or rales Chest wall: Nontender to palpation Heart: Irregularly irregular with a controlled ventricular response approximately 60, s1 s2 normal 1/6 systolic murmur; no diastolic murmur; occasional ectopic, Abdomen: Moderately large diastases recti; soft, nontender; no hepatosplenomehaly, BS+; abdominal aorta nontender and not dilated by palpation. Back: No CVA Pulses 2+ Extremities: Trivial edema above his sock line; no clubbing cyanosis, Homan's sign negative  Neurologic: grossly nonfocal Psychological: Normal affect and mood  ECG (independently read by me): Atrial fibrillation at 56 bpm.  No ectopy.  Normal intervals.  ECG (independently read by me): Atrial fibrillation with ventricular rate at 64 bpm.  Occasional PVC.  ECG (independently read by me): Atrial fibrillation with a controlled trickle rate of 57 bpm.  QTc interval normal at 4:30 milliseconds.  September 2015 ECG (independently read by me): Atrial fibrillation at 62 beats per minute.  Occasional PVCs with left bundle branch morphology.  Prior March 2015 ECG (independently read by me): Atrial fibrillation with a controlled ventricular response in the 60s. Nonspecific ST changes.  Prior ECG: Atrial fibrillation with a controlled ventricular response in the mid to upper 50s  LABS:  BMP Latest Ref Rng 04/11/2015 03/19/2015 03/05/2015  Glucose 65 - 99 mg/dL 93 89 73  BUN 8 - 27 mg/dL 52(H) 43(H) 40(H)  Creatinine 0.76 - 1.27 mg/dL 1.29(H) 1.20(H) 1.33(H)  BUN/Creat Ratio 10 - 22 40(H) - -  Sodium 136 - 144 mmol/L 137 139 137  Potassium 3.5 - 5.2 mmol/L 4.0 4.1 4.6  Chloride 97 - 106 mmol/L 96(L) 99 101  CO2 18 - 29 mmol/L '24 30 27  ' Calcium 8.6 - 10.2 mg/dL  9.4 9.3 9.8    Hepatic Function Latest Ref Rng 02/21/2015 12/25/2014 02/10/2014  Total Protein 6.1 - 8.1 g/dL 6.5 6.5 6.1  Albumin 3.6 - 5.1 g/dL 4.0 3.7 4.0  AST 10 - 35 U/L 29 26 24  ALT 9 - 46 U/L '17 16 21  ' Alk Phosphatase 40 - 115 U/L 130(H) 121(H) 78  Total Bilirubin 0.2 - 1.2 mg/dL 1.0 1.0 0.7  Bilirubin, Direct <=0.2 mg/dL 0.4(H) - -     CBC Latest Ref Rng 02/21/2015 12/25/2014 02/10/2014  WBC 4.0 - 10.5 K/uL 5.4 5.1 5.0  Hemoglobin 13.0 - 17.0 g/dL 11.1(L) 11.5(L) 11.8(L)  Hematocrit 39.0 - 52.0 % 33.4(L) 34.7(L) 35.4(L)  Platelets 150 - 400 K/uL 191 163.0 167     BNP    Component Value Date/Time   PROBNP 527.0* 09/19/2014 1710    Lipid Panel     Component Value Date/Time   CHOL 96 12/25/2014 1139   CHOL 120 02/10/2014 0820   TRIG 74.0 12/25/2014 1139   HDL 43.30 12/25/2014 1139   HDL 63 02/10/2014 0820   CHOLHDL 2 12/25/2014 1139   CHOLHDL 1.9 02/10/2014 0820   VLDL 14.8 12/25/2014 1139   LDLCALC 38 12/25/2014 1139   LDLCALC 46 02/10/2014 0820     RADIOLOGY: No results found.    ASSESSMENT AND PLAN: Mr. Halfmann is an 81 years old WM with along-standing type III aortic dissection which is followed by periodic CT imaging, originally while he lived in [place] and more recently followed by Dr. Cyndia Bent.  His last CT scan did show slight enlargement up to 5.9 cm, but he is not felt to be a good open surgical candidate and continued medical therapy approach was recommended by Dr. Cyndia Bent.  He is now 5 years status post CABG surgery. His last nuclear perfusion study in August 2013  showed fairly normal perfusion.  He recently developed CHF symptomatology secondary to sodium excess.  And had initially diuresed.  Since I last saw him, he again developed recurrent leg edema and had required transient use of metolazone prior to his furosemide dosing.  His most recent renal function shows a creatinine of 1.29 which is improved from 1.33 on 03/05/2015.  He is no longer taking  metolazone.  His blood pressure today is stable on Vasotec 20 mg twice a day, Lasix 80 mg twice a day.  His lipid studies were reviewed and are excellent on his current dose of Zocor 20 mg his atrial fibrillation rate is well-controlled.  He is not felt to be a Coumadin candidate..  As long as he continues to be stable, I will see him in 6 month for reevaluation, but available sooner if problems arise.  Time spent: 25 minutes  Troy Sine, MD, [facility]  04/19/2015 5:20 PM

## 2015-05-02 ENCOUNTER — Other Ambulatory Visit: Payer: Self-pay | Admitting: Cardiovascular Disease

## 2015-05-02 ENCOUNTER — Encounter: Payer: Self-pay | Admitting: Nurse Practitioner

## 2015-05-02 ENCOUNTER — Other Ambulatory Visit: Payer: Self-pay | Admitting: Internal Medicine

## 2015-05-02 NOTE — Telephone Encounter (Signed)
02/09/2015 

## 2015-05-02 NOTE — Telephone Encounter (Signed)
rx called into pharmacy

## 2015-05-02 NOTE — Telephone Encounter (Signed)
Approved: #[id number] x [id number] 

## 2015-05-03 ENCOUNTER — Telehealth: Payer: Self-pay | Admitting: Nurse Practitioner

## 2015-05-03 MED ORDER — POTASSIUM CHLORIDE ER 10 MEQ PO TBCR: 20.0000 meq | EXTENDED_RELEASE_TABLET | Freq: Three times a day (TID) | ORAL | Status: DC

## 2015-05-03 NOTE — Telephone Encounter (Signed)
Ok to do the 10 meq size. Please call and let family know and ok to call in.        lori    ----- Message -----     From: Loren Racer, LPN     Sent: [id number]  8:00 AM      To: Burtis Junes, NP    Subject: FW: Non-Urgent Medical Question                       ----- Message -----     From: [name]     Sent: 05/02/2015  6:30 PM      To: [facility] Triage    Subject: Non-Urgent Medical Question                 The klor con m20 pills are too large for Lee Gutierrez to swallow. Can you call in M10 for him. He doesn't mind taking 2 pills at a time.    Spoke with [name], DPR on file. Informed her that [name], NP said it was ok to switch to the 72mEq tabs. Verified pharmacy and sent in prescription.

## 2015-05-15 ENCOUNTER — Telehealth: Payer: Self-pay | Admitting: *Deleted

## 2015-05-15 ENCOUNTER — Encounter: Payer: Self-pay | Admitting: Nurse Practitioner

## 2015-05-15 ENCOUNTER — Other Ambulatory Visit: Payer: Self-pay | Admitting: *Deleted

## 2015-05-15 DIAGNOSIS — E876 Hypokalemia: Secondary | ICD-10-CM

## 2015-05-15 NOTE — Telephone Encounter (Signed)
S/w daughter per DPR.   Pt is to restart metalazone for 3 days and increase potassium to qid for 3 days.  Will go to [place] office on Friday, January 6 and get repeat  Bmet.  Appointment made orders in and linked. Pt's daughter stated verbal understanding.

## 2015-05-18 ENCOUNTER — Other Ambulatory Visit (INDEPENDENT_AMBULATORY_CARE_PROVIDER_SITE_OTHER): Payer: Medicare Other | Admitting: *Deleted

## 2015-05-18 DIAGNOSIS — E876 Hypokalemia: Secondary | ICD-10-CM

## 2015-05-19 LAB — BASIC METABOLIC PANEL
BUN/Creatinine Ratio: 27 — ABNORMAL HIGH (ref 10–22)
BUN: 38 mg/dL — ABNORMAL HIGH (ref 8–27)
CO2: 25 mmol/L (ref 18–29)
Calcium: 9.7 mg/dL (ref 8.6–10.2)
Chloride: 91 mmol/L — ABNORMAL LOW (ref 96–106)
Creatinine, Ser: 1.41 mg/dL — ABNORMAL HIGH (ref 0.76–1.27)
GFR calc Af Amer: 53 mL/min/{1.73_m2} — ABNORMAL LOW (ref >59)
GFR calc non Af Amer: 46 mL/min/{1.73_m2} — ABNORMAL LOW (ref >59)
Glucose: 96 mg/dL (ref 65–99)
Potassium: 3.8 mmol/L (ref 3.5–5.2)
Sodium: 133 mmol/L — ABNORMAL LOW (ref 134–144)

## 2015-05-20 ENCOUNTER — Encounter: Payer: Self-pay | Admitting: Internal Medicine

## 2015-05-21 ENCOUNTER — Telehealth: Payer: Self-pay | Admitting: Cardiovascular Disease

## 2015-05-21 NOTE — Telephone Encounter (Signed)
New message    Daughter calling for lab results.    Patient still not feeling well , sleeping a lot, falling x 2 ,should any changes on medication.

## 2015-05-21 NOTE — Telephone Encounter (Signed)
Dialed, phone rings w/ no answer or VM pickup.

## 2015-05-21 NOTE — Telephone Encounter (Signed)
Daughter is returning a call from the nurse.

## 2015-05-22 NOTE — Telephone Encounter (Signed)
Labs reviewed with daughter yesterday Patients weight down 7 pounds Discussed patient with Tera Helper NP today and no further recommendations from her Will forward to Dr Claiborne Billings for review

## 2015-05-22 NOTE — Telephone Encounter (Signed)
Ok; consider f/u bmet to make certain not getting dry

## 2015-05-22 NOTE — Telephone Encounter (Signed)
Attempted to call and no answer, phone continued to ring with no VM available to leave a message.

## 2015-05-23 ENCOUNTER — Ambulatory Visit (INDEPENDENT_AMBULATORY_CARE_PROVIDER_SITE_OTHER): Payer: Medicare Other | Admitting: Internal Medicine

## 2015-05-23 ENCOUNTER — Encounter: Payer: Self-pay | Admitting: Internal Medicine

## 2015-05-23 VITALS — BP 120/60 | HR 54 | Temp 97.7°F | Wt 232.0 lb

## 2015-05-23 DIAGNOSIS — S99921A Unspecified injury of right foot, initial encounter: Secondary | ICD-10-CM | POA: Diagnosis not present

## 2015-05-23 MED ORDER — SILVER SULFADIAZINE 1 % EX CREA: 1.0000 "application " | TOPICAL_CREAM | Freq: Every day | CUTANEOUS | Status: AC

## 2015-05-23 NOTE — Progress Notes (Signed)
Subjective:    Patient ID: Lee Gutierrez, male    DOB: 1930-12-06, 82 y.o.   MRN: [id number]  HPI Here with SIL Slipped and fell in bathroom 3 days ago He felt he pushed down too hard on his right great toe getting up He didn't mention it to daughter and SIL They saw it 2 days ago and they iced it--and noted blister that has since broken and was swollen made appt  No significant pain--maybe some today Slight drainage Used neosporin Cleaned with H2O2  Current Outpatient Prescriptions on File Prior to Visit  Medication Sig Dispense Refill  . amoxicillin (AMOXIL) 500 MG capsule TAKE 4 CAPSULES BY MOUTH 1 HOUR PRIOR TO DENTAL APPT  1  . aspirin 81 MG tablet Take 81 mg by mouth daily.    . chlorhexidine (PERIDEX) 0.12 % solution RINSE FOR 30 SECONDS WITH 1/2 OUNCE TWICE A DAY  12  . Cholecalciferol (VITAMIN D) 2000 UNITS tablet Take 2,000 Units by mouth daily.    . digoxin (LANOXIN) 0.125 MG tablet TAKE 1/2 TABLET (0.0625MG ) BY MOUTH ONCE DAILY 90 tablet 1  . enalapril (VASOTEC) 20 MG tablet Take 1 tablet (20 mg total) by mouth 2 (two) times daily. 180 tablet 3  . furosemide (LASIX) 40 MG tablet Take 80 mg (2 tablets) in the morning and 80 mg (2 tablets) in the early afternoon. 360 tablet 0  . Melatonin 10 MG TABS Take 1 tablet by mouth at bedtime.    . metolazone (ZAROXOLYN) 2.5 MG tablet Take 2.5 mg by mouth as directed. Take 1 by mouth once a week.    . metoprolol (LOPRESSOR) 50 MG tablet TAKE 1 TABLET (50 MG TOTAL) BY MOUTH 2 (TWO) TIMES DAILY. 180 tablet 3  . Multiple Vitamin (MULTIVITAMIN WITH MINERALS) TABS Take 1 tablet by mouth daily. Centrum Silver    . potassium chloride (K-DUR) 10 MEQ tablet Take 20 mEq by mouth 3 (three) times daily. And take 20 meq 4 times a day on the day you take Metolazone    . simvastatin (ZOCOR) 20 MG tablet TAKE 1 TABLET BY MOUTH AT BEDTIME. 90 tablet 3  . tamsulosin (FLOMAX) 0.4 MG CAPS capsule TAKE 1 CAPSULE (0.4 MG TOTAL) BY MOUTH DAILY. 90  capsule 3  . traMADol (ULTRAM) 50 MG tablet TAKE 1 TABLET BY MOUTH 3 TIMES A DAY AS NEEDED 90 tablet 0   No current facility-administered medications on file prior to visit.    No Known Allergies  Past Medical History  Diagnosis Date  . Hypertension   . Arthritis   . Coronary artery disease   . 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 5 x 4.9, proximal descending thoracic aortic aorta measuring 5.8 x 5.1, the aortic 5.6 x 5.5 and distal thoracic aorta 3.9 x 4.1, His infrarenal aneurysm measured 3 x 2.7.  . Shortness of breath   . Aortic dissection ([place])   . Dysrhythmia     atrial fib  . Blood transfusion   . CHF, acute on chronic ([place]) 03/24/2011  . HOH (hard of hearing)     bilateral hearing aids  . Hx of echocardiogram 12/2011    EF 40% showed severe LA dilation, He had moderate concentric LHV. He has moderate inferior wall hypokinesis. There was evidence for pulmonary hypertension with an estimated RV systolic pressure at 62. He had moderate TR  . History of stress test 12/2011    Showed mild inferior thinning felt  most likely due to attenuation artifact.    Past Surgical History  Procedure Laterality Date  . Replacement total knee      bilaterally  . Joint replacement      Bilateral knee  . Tonsillectomy    . Hernia repair    . Cardiac catheterization    . Coronary artery bypass graft  Feb 2011  . Ankle fusion  04/22/2012    Procedure: ARTHRODESIS ANKLE;  Surgeon: Wylene Simmer, MD;  Location: [facility];  Service: Orthopedics;  Laterality: Left;  Left Ankle and Subtalar Arthrodesis     Family History  Problem Relation Age of Onset  . Hypertension Father   . COPD Brother   . Diabetes Neg Hx   . Cancer Neg Hx   . Heart attack Neg Hx   . Stroke Neg Hx     Social History   Social History  . Marital Status: Widowed    Spouse Name: N/A  . Number of Children: 3  . Years of Education: N/A   Occupational History  . retired  Architect    Social History Main Topics  . Smoking status: Former Smoker    Quit date: 02/04/1963  . Smokeless tobacco: Never Used  . Alcohol Use: No  . Drug Use: No  . Sexual Activity: Not Currently   Other Topics Concern  . Not on file   Social History Narrative   Has living will.   Daughter [name] has health care POA    Would want resuscitation attempts but no prolonged artificial ventilation.   Would not want tube feedings if cognitively aware   Review of Systems  No fever Feels fine     Objective:   Physical Exam  Skin:  Superficial ulcer on dorsal right great toe without infection Mild contusion in great toe (but no tenderness) and base of 2nd toe No tenderness in foot at all          Assessment & Plan:

## 2015-05-23 NOTE — Progress Notes (Signed)
Pre visit review using our clinic review tool, if applicable. No additional management support is needed unless otherwise documented below in the visit note. 

## 2015-05-23 NOTE — Assessment & Plan Note (Signed)
Mild contusion and blister which broke No infection Covered with bandaid and silvadene They will continue this daily till granulated

## 2015-05-25 ENCOUNTER — Other Ambulatory Visit: Payer: Self-pay | Admitting: Nurse Practitioner

## 2015-05-29 ENCOUNTER — Telehealth: Payer: Self-pay | Admitting: Nurse Practitioner

## 2015-05-29 ENCOUNTER — Encounter: Payer: Self-pay | Admitting: Nurse Practitioner

## 2015-05-29 NOTE — Telephone Encounter (Signed)
Spoke to daughter after last email - ok to use imodium prn. Have already cut the diuretics back.  Will continue to follow for and give supportive care.   Burtis Junes, RN, [facility] [address] [place], [place]  [phone]

## 2015-05-30 ENCOUNTER — Encounter: Payer: Self-pay | Admitting: Internal Medicine

## 2015-05-30 NOTE — Telephone Encounter (Signed)
I spoke to SIL then daughter Had diarrhea and not eating  Confused, forgetful but no clear signs of stroke Advised to ER if worsens  Will add tomorrow at 1:4PM  Carrie---please add on at 1:45 tomorrow and take off on Friday. Barnett Applebaum-- sorry about no notice. I will just need a CMA to check him in a little earlier than I usually start

## 2015-05-31 ENCOUNTER — Ambulatory Visit (INDEPENDENT_AMBULATORY_CARE_PROVIDER_SITE_OTHER): Payer: Medicare Other | Admitting: Internal Medicine

## 2015-05-31 ENCOUNTER — Ambulatory Visit: Payer: Medicare Other | Admitting: Internal Medicine

## 2015-05-31 ENCOUNTER — Encounter: Payer: Self-pay | Admitting: Internal Medicine

## 2015-05-31 VITALS — BP 110/80 | HR 64 | Temp 97.4°F | Wt 226.8 lb

## 2015-05-31 DIAGNOSIS — I5042 Chronic combined systolic (congestive) and diastolic (congestive) heart failure: Secondary | ICD-10-CM | POA: Diagnosis not present

## 2015-05-31 DIAGNOSIS — K529 Noninfective gastroenteritis and colitis, unspecified: Secondary | ICD-10-CM | POA: Diagnosis not present

## 2015-05-31 NOTE — Progress Notes (Signed)
Subjective:    Patient ID: Lee Gutierrez, male    DOB: 03-08-1931, 82 y.o.   MRN: [id number]  HPI Here with daughter due to diarrhea  Started with loose stools about a week Frequent small amounts--like even if he just stands up No abdominal pain No blood in stool No nausea or vomiting Eating very little Drinking less than usual--but still coffee/water (about the same as usual)  Family notes some confusion Forgets he put a bagel in toaster--even 5 minutes later Dozing off easily  Using OTC antidiarrheal (?imodium)) once a day   Current Outpatient Prescriptions on File Prior to Visit  Medication Sig Dispense Refill  . amoxicillin (AMOXIL) 500 MG capsule TAKE 4 CAPSULES BY MOUTH 1 HOUR PRIOR TO DENTAL APPT  1  . aspirin 81 MG tablet Take 81 mg by mouth daily.    . chlorhexidine (PERIDEX) 0.12 % solution RINSE FOR 30 SECONDS WITH 1/2 OUNCE TWICE A DAY  12  . Cholecalciferol (VITAMIN D) 2000 UNITS tablet Take 2,000 Units by mouth daily.    . digoxin (LANOXIN) 0.125 MG tablet TAKE 1/2 TABLET (0.0625MG ) BY MOUTH ONCE DAILY 90 tablet 1  . enalapril (VASOTEC) 20 MG tablet Take 1 tablet (20 mg total) by mouth 2 (two) times daily. 180 tablet 3  . furosemide (LASIX) 40 MG tablet TAKE 80 MG (2 TABLETS) IN THE MORNING AND 80 MG (2 TABLETS) IN THE EARLY AFTERNOON. 360 tablet 5  . Melatonin 10 MG TABS Take 1 tablet by mouth at bedtime.    . metolazone (ZAROXOLYN) 2.5 MG tablet Take 2.5 mg by mouth as directed. Take 1 by mouth once a week.    . metoprolol (LOPRESSOR) 50 MG tablet TAKE 1 TABLET (50 MG TOTAL) BY MOUTH 2 (TWO) TIMES DAILY. (Patient taking differently: TAKE 1 TABLET (50 MG TOTAL) BY MOUTH AS NEEDED) 180 tablet 3  . Multiple Vitamin (MULTIVITAMIN WITH MINERALS) TABS Take 1 tablet by mouth daily. Centrum Silver    . potassium chloride (K-DUR) 10 MEQ tablet Take 20 mEq by mouth 3 (three) times daily. And take 20 meq 4 times a day on the day you take Metolazone    . silver  sulfADIAZINE (SILVADENE) 1 % cream Apply 1 application topically daily. 50 g 0  . simvastatin (ZOCOR) 20 MG tablet TAKE 1 TABLET BY MOUTH AT BEDTIME. 90 tablet 3  . tamsulosin (FLOMAX) 0.4 MG CAPS capsule TAKE 1 CAPSULE (0.4 MG TOTAL) BY MOUTH DAILY. 90 capsule 3  . traMADol (ULTRAM) 50 MG tablet TAKE 1 TABLET BY MOUTH 3 TIMES A DAY AS NEEDED 90 tablet 0   No current facility-administered medications on file prior to visit.    No Known Allergies  Past Medical History  Diagnosis Date  . Hypertension   . Arthritis   . Coronary artery disease   . 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 5 x 4.9, proximal descending thoracic aortic aorta measuring 5.8 x 5.1, the aortic 5.6 x 5.5 and distal thoracic aorta 3.9 x 4.1, His infrarenal aneurysm measured 3 x 2.7.  . Shortness of breath   . Aortic dissection ([place])   . Dysrhythmia     atrial fib  . Blood transfusion   . CHF, acute on chronic ([place]) 03/24/2011  . HOH (hard of hearing)     bilateral hearing aids  . Hx of echocardiogram 12/2011    EF 40% showed severe LA dilation, He had moderate concentric LHV. He has  moderate inferior wall hypokinesis. There was evidence for pulmonary hypertension with an estimated RV systolic pressure at 62. He had moderate TR  . History of stress test 12/2011    Showed mild inferior thinning felt most likely due to attenuation artifact.    Past Surgical History  Procedure Laterality Date  . Replacement total knee      bilaterally  . Joint replacement      Bilateral knee  . Tonsillectomy    . Hernia repair    . Cardiac catheterization    . Coronary artery bypass graft  Feb 2011  . Ankle fusion  04/22/2012    Procedure: ARTHRODESIS ANKLE;  Surgeon: Wylene Simmer, MD;  Location: [facility];  Service: Orthopedics;  Laterality: Left;  Left Ankle and Subtalar Arthrodesis     Family History  Problem Relation Age of Onset  . Hypertension Father   . COPD Brother   . Diabetes Neg  Hx   . Cancer Neg Hx   . Heart attack Neg Hx   . Stroke Neg Hx     Social History   Social History  . Marital Status: Widowed    Spouse Name: N/A  . Number of Children: 3  . Years of Education: N/A   Occupational History  . retired Architect    Social History Main Topics  . Smoking status: Former Smoker    Quit date: 02/04/1963  . Smokeless tobacco: Never Used  . Alcohol Use: No  . Drug Use: No  . Sexual Activity: Not Currently   Other Topics Concern  . Not on file   Social History Narrative   Has living will.   Daughter [name] has health care POA    Would want resuscitation attempts but no prolonged artificial ventilation.   Would not want tube feedings if cognitively aware   Review of Systems No fever No SOB No sig edema Toe is improved Doesn't weight regularly--not sure if he is down at home now    Objective:   Physical Exam  Constitutional: He appears well-developed. No distress.  Pulmonary/Chest: Effort normal. No respiratory distress. He has no wheezes.  Slight bibasilar crackles  Abdominal: Soft. Bowel sounds are normal. He exhibits no distension. There is no tenderness. There is no rebound and no guarding.  Neurological:  Normal speech and interaction          Assessment & Plan:

## 2015-05-31 NOTE — Progress Notes (Signed)
Pre visit review using our clinic review tool, if applicable. No additional management support is needed unless otherwise documented below in the visit note. 

## 2015-05-31 NOTE — Patient Instructions (Signed)
Please stop the furosemide until your morning weight at home is at or above your usual. Continue to weight yourself daily

## 2015-05-31 NOTE — Assessment & Plan Note (Signed)
At most mild dehydration Mild confusion No worrisome abdominal findings Discussed--no imodium Increase fluids

## 2015-05-31 NOTE — Assessment & Plan Note (Signed)
Discussed going back to daily weights Hold furosemide till AM home weights back at or above his usual

## 2015-06-01 ENCOUNTER — Ambulatory Visit: Payer: Medicare Other | Admitting: Internal Medicine

## 2015-06-02 ENCOUNTER — Encounter: Payer: Self-pay | Admitting: Internal Medicine

## 2015-06-05 ENCOUNTER — Encounter: Payer: Self-pay | Admitting: Internal Medicine

## 2015-06-08 ENCOUNTER — Encounter: Payer: Self-pay | Admitting: Nurse Practitioner

## 2015-06-12 ENCOUNTER — Encounter: Payer: Self-pay | Admitting: Nurse Practitioner

## 2015-06-12 ENCOUNTER — Telehealth: Payer: Self-pay | Admitting: *Deleted

## 2015-06-12 ENCOUNTER — Other Ambulatory Visit: Payer: Self-pay | Admitting: *Deleted

## 2015-06-12 DIAGNOSIS — I5041 Acute combined systolic (congestive) and diastolic (congestive) heart failure: Secondary | ICD-10-CM

## 2015-06-12 NOTE — Telephone Encounter (Signed)
lvm to state pt needs bmet to check potassium before switching to liquid.  A lot of times the shell of the potassium comes out whole.  Potassium has been okay on previous lab work.

## 2015-06-12 NOTE — Telephone Encounter (Signed)
S/w pt's daughter per DPR and is agreeable to plan will get a repeat bmet on feb 3 in [place]. Appointment made orders in and linked.

## 2015-06-15 ENCOUNTER — Other Ambulatory Visit (INDEPENDENT_AMBULATORY_CARE_PROVIDER_SITE_OTHER): Payer: Medicare Other

## 2015-06-15 DIAGNOSIS — I5041 Acute combined systolic (congestive) and diastolic (congestive) heart failure: Secondary | ICD-10-CM

## 2015-06-16 LAB — BASIC METABOLIC PANEL
BUN/Creatinine Ratio: 21 (ref 10–22)
BUN: 25 mg/dL (ref 8–27)
CO2: 21 mmol/L (ref 18–29)
Calcium: 9.9 mg/dL (ref 8.6–10.2)
Chloride: 100 mmol/L (ref 96–106)
Creatinine, Ser: 1.18 mg/dL (ref 0.76–1.27)
GFR calc Af Amer: 66 mL/min/{1.73_m2} (ref >59)
GFR calc non Af Amer: 57 mL/min/{1.73_m2} — ABNORMAL LOW (ref >59)
Glucose: 87 mg/dL (ref 65–99)
Potassium: 5 mmol/L (ref 3.5–5.2)
Sodium: 137 mmol/L (ref 134–144)

## 2015-06-18 ENCOUNTER — Encounter: Payer: Self-pay | Admitting: Internal Medicine

## 2015-06-18 ENCOUNTER — Telehealth: Payer: Self-pay | Admitting: *Deleted

## 2015-06-18 DIAGNOSIS — R197 Diarrhea, unspecified: Secondary | ICD-10-CM

## 2015-06-18 NOTE — Telephone Encounter (Signed)
DPR on file for pt's daughter [name]. [name] aware of lab results by phone w/verbal understanding. she stated pt still having diarrhrea and wants to know if ok to take Imodium. I asked if s/w PCP yet, she said yes but was told Noro virus going around and lingering. Daughter states diarrhea going on x 1 month. She also c/o incontinence for pt, which I then directed to d/w PCP. [name] is concerned about all the meds her dad takes if it is ok about the Imodium. I told her I will ask Tera Helper, NP and let her know about the Imodium. [name] thanked me for my help today.

## 2015-06-18 NOTE — Telephone Encounter (Signed)
I s/w Lee Gutierrez and advised per Tera Helper. NP ok to take Imodium and to be sure to f/u w/PCP about dirrhea x 1 month, may need stool cards done.

## 2015-06-19 ENCOUNTER — Encounter: Payer: Self-pay | Admitting: Internal Medicine

## 2015-06-20 NOTE — Telephone Encounter (Signed)
Are you working on this referral?

## 2015-06-20 NOTE — Telephone Encounter (Signed)
Yes. We have advised daughter, [name], that we have faxed referral to [facility] and I will call her once appt has been made. Thanks

## 2015-06-27 ENCOUNTER — Other Ambulatory Visit
Admission: RE | Admit: 2015-06-27 | Discharge: 2015-06-27 | Disposition: A | Discharge status: Home / Self Care | Payer: Medicare Other | Source: Ambulatory Visit | Attending: Nurse Practitioner | Admitting: Nurse Practitioner

## 2015-06-27 DIAGNOSIS — R197 Diarrhea, unspecified: Secondary | ICD-10-CM | POA: Diagnosis present

## 2015-06-27 LAB — GASTROINTESTINAL PANEL BY PCR, STOOL (REPLACES STOOL CULTURE)

## 2015-06-27 LAB — C DIFFICILE QUICK SCREEN W PCR REFLEX
C Diff antigen: NEGATIVE
C Diff interpretation: NEGATIVE
C Diff toxin: NEGATIVE

## 2015-07-20 ENCOUNTER — Other Ambulatory Visit
Admission: RE | Admit: 2015-07-20 | Discharge: 2015-07-20 | Disposition: A | Discharge status: Home / Self Care | Payer: Medicare Other | Source: Skilled Nursing Facility | Attending: Nurse Practitioner | Admitting: Nurse Practitioner

## 2015-07-20 DIAGNOSIS — R197 Diarrhea, unspecified: Secondary | ICD-10-CM | POA: Diagnosis present

## 2015-07-20 LAB — C DIFFICILE QUICK SCREEN W PCR REFLEX
C Diff antigen: NEGATIVE
C Diff interpretation: NEGATIVE
C Diff toxin: NEGATIVE

## 2015-07-25 ENCOUNTER — Encounter: Payer: Self-pay | Admitting: Nurse Practitioner

## 2015-07-25 DIAGNOSIS — Z79899 Other long term (current) drug therapy: Secondary | ICD-10-CM

## 2015-07-25 DIAGNOSIS — R609 Edema, unspecified: Secondary | ICD-10-CM

## 2015-07-26 NOTE — Telephone Encounter (Signed)
SPOKE WITH PT'S DAUGHTER  AND  DAD  HAD NOT  BEEN TAKING METOLAZONE WEEKLY  WILL RESTART  AND   HAVE   LABS  DONE  AS  INSTRUCTED .Adonis Housekeeper

## 2015-07-27 ENCOUNTER — Encounter: Payer: Self-pay | Admitting: Nurse Practitioner

## 2015-07-27 ENCOUNTER — Telehealth: Payer: Self-pay | Admitting: Internal Medicine

## 2015-07-27 ENCOUNTER — Other Ambulatory Visit (INDEPENDENT_AMBULATORY_CARE_PROVIDER_SITE_OTHER): Payer: Medicare Other

## 2015-07-27 DIAGNOSIS — I5043 Acute on chronic combined systolic (congestive) and diastolic (congestive) heart failure: Secondary | ICD-10-CM

## 2015-07-27 DIAGNOSIS — R609 Edema, unspecified: Secondary | ICD-10-CM | POA: Diagnosis not present

## 2015-07-27 DIAGNOSIS — R0600 Dyspnea, unspecified: Secondary | ICD-10-CM

## 2015-07-27 NOTE — Telephone Encounter (Signed)
[facility] Patient Name: Lee Gutierrez DOB: 06-Oct-1930 Initial Comment Caller stated that her father is having congested heart failure, her father is urinating again and she was wanting to inform the Dr. because the Dr. told her to take him to the ER if he urinates again. Bloated, swollen and feet hurt. Nurse Assessment Nurse: Martyn Ehrich, RN, [place] Date/Time (Eastern Time): 07/27/2015 5:10:19 PM Confirm and document reason for call. If symptomatic, describe symptoms. You must click the next button to save text entered. ---PT has CHF and Dtr was with him a few minutes ago. She called bc he has CHF and wt went way up and metalazone was not working. Now he is urinating - the medication kicked in after 2nd dose ordered by cardiology. Dr. Silvio Pate said that if he was not urinating he needed to go to ER. She wants to confur with [place] (was not urinating yesterday afternoon or this morning. He took his 2nd dose and it is beginning to work. They did blood test to check heart and kidney function [facility] and results wont be available until Mon. His cardiology isat [facility]. Asked her if we can conference her Dad into the phone - she said he can't hear well enough on the phone to participate Has the patient traveled out of the country within the last 30 days? ---Not Applicable Does the patient have any new or worsening symptoms? ---Yes Will a triage be completed? ---No Select reason for no triage. ---Patient declined Please document clinical information provided and list any resource used. ---daughter refused to conference pt into call or go where he is (she was there earlier today) She just wanted to pass this message to [facility] that water pill is working. Wanted to give advice - she said that she declined triage. Told her certainly if he has any chest pain or increasing  swelling since he was seen or trouble breathing call 911. Referenced CHF triage guide and chest pain triage guide and leg swelling triage guide. She said he has not been seen by MD that they manage this by telephone and have been in touch with cardiology. I offered to page MD on call for her (which is a different MD) and she declined to have MD paged. Guidelines Guideline Title Affirmed Question Affirmed Notes  Final Disposition User Clinical Call [place], RN, [facility]  Call Id: [id number]

## 2015-07-28 ENCOUNTER — Emergency Department: Payer: Medicare Other

## 2015-07-28 ENCOUNTER — Encounter: Payer: Self-pay | Admitting: Emergency Medicine

## 2015-07-28 ENCOUNTER — Telehealth: Payer: Self-pay | Admitting: Physician Assistant

## 2015-07-28 ENCOUNTER — Encounter: Payer: Self-pay | Admitting: Nurse Practitioner

## 2015-07-28 ENCOUNTER — Inpatient Hospital Stay
Admission: EM | Admit: 2015-07-28 | Discharge: 2015-08-01 | DRG: 291 | Disposition: A | Discharge status: Home / Self Care | Payer: Medicare Other | Attending: Internal Medicine | Admitting: Internal Medicine

## 2015-07-28 DIAGNOSIS — I272 Other secondary pulmonary hypertension: Secondary | ICD-10-CM | POA: Diagnosis present

## 2015-07-28 DIAGNOSIS — E871 Hypo-osmolality and hyponatremia: Secondary | ICD-10-CM | POA: Diagnosis present

## 2015-07-28 DIAGNOSIS — N400 Enlarged prostate without lower urinary tract symptoms: Secondary | ICD-10-CM | POA: Diagnosis present

## 2015-07-28 DIAGNOSIS — E876 Hypokalemia: Secondary | ICD-10-CM | POA: Diagnosis not present

## 2015-07-28 DIAGNOSIS — Z825 Family history of asthma and other chronic lower respiratory diseases: Secondary | ICD-10-CM | POA: Diagnosis not present

## 2015-07-28 DIAGNOSIS — I5023 Acute on chronic systolic (congestive) heart failure: Secondary | ICD-10-CM

## 2015-07-28 DIAGNOSIS — M1990 Unspecified osteoarthritis, unspecified site: Secondary | ICD-10-CM | POA: Diagnosis present

## 2015-07-28 DIAGNOSIS — I472 Ventricular tachycardia: Secondary | ICD-10-CM | POA: Diagnosis not present

## 2015-07-28 DIAGNOSIS — Z951 Presence of aortocoronary bypass graft: Secondary | ICD-10-CM | POA: Diagnosis not present

## 2015-07-28 DIAGNOSIS — R634 Abnormal weight loss: Secondary | ICD-10-CM | POA: Diagnosis present

## 2015-07-28 DIAGNOSIS — I10 Essential (primary) hypertension: Secondary | ICD-10-CM | POA: Diagnosis present

## 2015-07-28 DIAGNOSIS — I7100 Dissection of unspecified site of aorta: Secondary | ICD-10-CM | POA: Diagnosis present

## 2015-07-28 DIAGNOSIS — N183 Chronic kidney disease, stage 3 (moderate): Secondary | ICD-10-CM | POA: Diagnosis not present

## 2015-07-28 DIAGNOSIS — Z79891 Long term (current) use of opiate analgesic: Secondary | ICD-10-CM | POA: Diagnosis not present

## 2015-07-28 DIAGNOSIS — I248 Other forms of acute ischemic heart disease: Secondary | ICD-10-CM | POA: Diagnosis present

## 2015-07-28 DIAGNOSIS — E875 Hyperkalemia: Secondary | ICD-10-CM | POA: Diagnosis present

## 2015-07-28 DIAGNOSIS — Z7982 Long term (current) use of aspirin: Secondary | ICD-10-CM

## 2015-07-28 DIAGNOSIS — Z8249 Family history of ischemic heart disease and other diseases of the circulatory system: Secondary | ICD-10-CM

## 2015-07-28 DIAGNOSIS — K529 Noninfective gastroenteritis and colitis, unspecified: Secondary | ICD-10-CM | POA: Diagnosis present

## 2015-07-28 DIAGNOSIS — Z66 Do not resuscitate: Secondary | ICD-10-CM | POA: Diagnosis present

## 2015-07-28 DIAGNOSIS — I716 Thoracoabdominal aortic aneurysm, without rupture: Secondary | ICD-10-CM | POA: Diagnosis present

## 2015-07-28 DIAGNOSIS — T445X5A Adverse effect of predominantly beta-adrenoreceptor agonists, initial encounter: Secondary | ICD-10-CM | POA: Diagnosis present

## 2015-07-28 DIAGNOSIS — I482 Chronic atrial fibrillation, unspecified: Secondary | ICD-10-CM | POA: Diagnosis present

## 2015-07-28 DIAGNOSIS — I5043 Acute on chronic combined systolic (congestive) and diastolic (congestive) heart failure: Secondary | ICD-10-CM | POA: Diagnosis present

## 2015-07-28 DIAGNOSIS — Z96653 Presence of artificial knee joint, bilateral: Secondary | ICD-10-CM | POA: Diagnosis present

## 2015-07-28 DIAGNOSIS — I2510 Atherosclerotic heart disease of native coronary artery without angina pectoris: Secondary | ICD-10-CM | POA: Diagnosis present

## 2015-07-28 DIAGNOSIS — Z7951 Long term (current) use of inhaled steroids: Secondary | ICD-10-CM

## 2015-07-28 DIAGNOSIS — I110 Hypertensive heart disease with heart failure: Secondary | ICD-10-CM | POA: Diagnosis not present

## 2015-07-28 DIAGNOSIS — H9190 Unspecified hearing loss, unspecified ear: Secondary | ICD-10-CM | POA: Diagnosis present

## 2015-07-28 DIAGNOSIS — N179 Acute kidney failure, unspecified: Secondary | ICD-10-CM | POA: Diagnosis present

## 2015-07-28 DIAGNOSIS — H9193 Unspecified hearing loss, bilateral: Secondary | ICD-10-CM | POA: Diagnosis present

## 2015-07-28 DIAGNOSIS — E785 Hyperlipidemia, unspecified: Secondary | ICD-10-CM | POA: Diagnosis present

## 2015-07-28 DIAGNOSIS — Z87891 Personal history of nicotine dependence: Secondary | ICD-10-CM

## 2015-07-28 DIAGNOSIS — Z7952 Long term (current) use of systemic steroids: Secondary | ICD-10-CM

## 2015-07-28 DIAGNOSIS — Z79899 Other long term (current) drug therapy: Secondary | ICD-10-CM

## 2015-07-28 DIAGNOSIS — Z7901 Long term (current) use of anticoagulants: Secondary | ICD-10-CM | POA: Diagnosis not present

## 2015-07-28 HISTORY — DX: Chronic atrial fibrillation, unspecified: I48.20

## 2015-07-28 HISTORY — DX: Chronic combined systolic (congestive) and diastolic (congestive) heart failure: I50.42

## 2015-07-28 LAB — TSH: TSH: 1.771 u[IU]/mL (ref 0.350–4.500)

## 2015-07-28 LAB — TROPONIN I
TROPONIN I: 0.04 ng/mL — AB (ref <0.031)
Troponin I: <0.03 ng/mL (ref <0.031)
Troponin I: 0.03 ng/mL (ref <0.031)

## 2015-07-28 LAB — BASIC METABOLIC PANEL
ANION GAP: 7 (ref 5–15)
BUN/Creatinine Ratio: 33 — ABNORMAL HIGH (ref 10–22)
BUN: 52 mg/dL — ABNORMAL HIGH (ref 8–27)
BUN: 63 mg/dL — ABNORMAL HIGH (ref 6–20)
CALCIUM: 9 mg/dL (ref 8.9–10.3)
CO2: 22 mmol/L (ref 18–29)
CO2: 23 mmol/L (ref 22–32)
Calcium: 9.2 mg/dL (ref 8.6–10.2)
Chloride: 95 mmol/L — ABNORMAL LOW (ref 96–106)
Chloride: 100 mmol/L — ABNORMAL LOW (ref 101–111)
Creatinine, Ser: 1.57 mg/dL — ABNORMAL HIGH (ref 0.76–1.27)
Creatinine, Ser: 1.58 mg/dL — ABNORMAL HIGH (ref 0.61–1.24)
GFR calc Af Amer: 47 mL/min/{1.73_m2} — ABNORMAL LOW (ref >59)
GFR calc Af Amer: 45 mL/min — ABNORMAL LOW (ref >60)
GFR calc non Af Amer: 40 mL/min/{1.73_m2} — ABNORMAL LOW (ref >59)
GFR calc non Af Amer: 39 mL/min — ABNORMAL LOW (ref >60)
GLUCOSE: 104 mg/dL — AB (ref 65–99)
Glucose: 95 mg/dL (ref 65–99)
Potassium: 5.3 mmol/L — ABNORMAL HIGH (ref 3.5–5.2)
Potassium: 5.5 mmol/L — ABNORMAL HIGH (ref 3.5–5.1)
Sodium: 132 mmol/L — ABNORMAL LOW (ref 134–144)
Sodium: 130 mmol/L — ABNORMAL LOW (ref 135–145)

## 2015-07-28 LAB — CBC
HEMATOCRIT: 35.4 % — AB (ref 40.0–52.0)
HEMOGLOBIN: 11.7 g/dL — AB (ref 13.0–18.0)
MCH: 33 pg (ref 26.0–34.0)
MCHC: 33 g/dL (ref 32.0–36.0)
MCV: 100.1 fL — ABNORMAL HIGH (ref 80.0–100.0)
Platelets: 172 10*3/uL (ref 150–440)
RBC: 3.54 MIL/uL — ABNORMAL LOW (ref 4.40–5.90)
RDW: 16.1 % — AB (ref 11.5–14.5)
WBC: 6.5 10*3/uL (ref 3.8–10.6)

## 2015-07-28 LAB — BRAIN NATRIURETIC PEPTIDE
B Natriuretic Peptide: 1000 pg/mL — ABNORMAL HIGH (ref 0.0–100.0)
BNP: 534.9 pg/mL — ABNORMAL HIGH (ref 0.0–100.0)

## 2015-07-28 MED ORDER — HYDROCODONE-ACETAMINOPHEN 5-325 MG PO TABS: 1.0000 | ORAL_TABLET | ORAL | Status: DC | PRN

## 2015-07-28 MED ORDER — ACETAMINOPHEN 325 MG PO TABS
650.0000 mg | ORAL_TABLET | Freq: Four times a day (QID) | ORAL | Status: DC | PRN
Start: 2015-07-28 — End: 2015-08-01
  Administered 2015-07-31: 650 mg via ORAL
  Filled 2015-07-28: qty 2

## 2015-07-28 MED ORDER — DIGOXIN 125 MCG PO TABS
0.1250 mg | ORAL_TABLET | Freq: Every day | ORAL | Status: DC
  Administered 2015-07-29 – 2015-08-01 (×4): 0.125 mg via ORAL
  Filled 2015-07-28 (×4): qty 1

## 2015-07-28 MED ORDER — SODIUM POLYSTYRENE SULFONATE 15 GM/60ML PO SUSP: 30.0000 g | Freq: Once | ORAL | Status: DC

## 2015-07-28 MED ORDER — POTASSIUM CHLORIDE ER 10 MEQ PO TBCR: 20.0000 meq | EXTENDED_RELEASE_TABLET | ORAL | Status: DC

## 2015-07-28 MED ORDER — TAMSULOSIN HCL 0.4 MG PO CAPS
0.4000 mg | ORAL_CAPSULE | Freq: Every day | ORAL | Status: DC
  Administered 2015-07-28 – 2015-07-31 (×4): 0.4 mg via ORAL
  Filled 2015-07-28 (×4): qty 1

## 2015-07-28 MED ORDER — VITAMIN D 1000 UNITS PO TABS
2000.0000 [IU] | ORAL_TABLET | Freq: Every day | ORAL | Status: DC
  Administered 2015-07-29 – 2015-08-01 (×4): 2000 [IU] via ORAL
  Filled 2015-07-28 (×4): qty 2

## 2015-07-28 MED ORDER — RISAQUAD PO CAPS
1.0000 | ORAL_CAPSULE | ORAL | Status: DC
  Administered 2015-07-29 – 2015-08-01 (×4): 1 via ORAL
  Filled 2015-07-28 (×4): qty 1

## 2015-07-28 MED ORDER — ONDANSETRON HCL 4 MG/2ML IJ SOLN: 4.0000 mg | Freq: Four times a day (QID) | INTRAMUSCULAR | Status: DC | PRN

## 2015-07-28 MED ORDER — SIMVASTATIN 20 MG PO TABS
20.0000 mg | ORAL_TABLET | Freq: Every day | ORAL | Status: DC
  Administered 2015-07-28 – 2015-07-31 (×4): 20 mg via ORAL
  Filled 2015-07-28 (×4): qty 1

## 2015-07-28 MED ORDER — ONDANSETRON HCL 4 MG PO TABS: 4.0000 mg | ORAL_TABLET | Freq: Four times a day (QID) | ORAL | Status: DC | PRN

## 2015-07-28 MED ORDER — DEXTROSE 50 % IV SOLN
25.0000 mL | Freq: Once | INTRAVENOUS | Status: AC
  Administered 2015-07-28: 25 mL via INTRAVENOUS
  Filled 2015-07-28: qty 50

## 2015-07-28 MED ORDER — ALUM & MAG HYDROXIDE-SIMETH 200-200-20 MG/5ML PO SUSP: 30.0000 mL | Freq: Four times a day (QID) | ORAL | Status: DC | PRN

## 2015-07-28 MED ORDER — SODIUM CHLORIDE 0.9% FLUSH
3.0000 mL | INTRAVENOUS | Status: DC | PRN
  Administered 2015-07-30: 3 mL via INTRAVENOUS
  Filled 2015-07-28: qty 3

## 2015-07-28 MED ORDER — ASPIRIN EC 81 MG PO TBEC
81.0000 mg | DELAYED_RELEASE_TABLET | Freq: Every day | ORAL | Status: DC
  Administered 2015-07-29 – 2015-08-01 (×4): 81 mg via ORAL
  Filled 2015-07-28 (×4): qty 1

## 2015-07-28 MED ORDER — MELATONIN 10 MG PO TABS: 10.0000 mg | ORAL_TABLET | Freq: Every day | ORAL | Status: DC

## 2015-07-28 MED ORDER — INSULIN REGULAR HUMAN 100 UNIT/ML IJ SOLN
10.0000 [IU] | Freq: Once | INTRAMUSCULAR | Status: AC
  Administered 2015-07-28: 10 [IU] via INTRAVENOUS
  Filled 2015-07-28 (×2): qty 0.1

## 2015-07-28 MED ORDER — METOPROLOL TARTRATE 50 MG PO TABS
50.0000 mg | ORAL_TABLET | Freq: Two times a day (BID) | ORAL | Status: DC
  Administered 2015-07-28 – 2015-08-01 (×8): 50 mg via ORAL
  Filled 2015-07-28 (×8): qty 1

## 2015-07-28 MED ORDER — SODIUM CHLORIDE 0.9 % IV SOLN: 250.0000 mL | INTRAVENOUS | Status: DC | PRN

## 2015-07-28 MED ORDER — ADULT MULTIVITAMIN W/MINERALS CH
1.0000 | ORAL_TABLET | Freq: Every day | ORAL | Status: DC
  Administered 2015-07-29 – 2015-08-01 (×4): 1 via ORAL
  Filled 2015-07-28 (×4): qty 1

## 2015-07-28 MED ORDER — ENOXAPARIN SODIUM 40 MG/0.4ML ~~LOC~~ SOLN
40.0000 mg | SUBCUTANEOUS | Status: DC
  Administered 2015-07-28 – 2015-07-29 (×2): 40 mg via SUBCUTANEOUS
  Filled 2015-07-28 (×3): qty 0.4

## 2015-07-28 MED ORDER — SENNOSIDES-DOCUSATE SODIUM 8.6-50 MG PO TABS: 1.0000 | ORAL_TABLET | Freq: Every evening | ORAL | Status: DC | PRN

## 2015-07-28 MED ORDER — SODIUM CHLORIDE 0.9% FLUSH
3.0000 mL | Freq: Two times a day (BID) | INTRAVENOUS | Status: DC
  Administered 2015-07-28 – 2015-07-31 (×5): 3 mL via INTRAVENOUS

## 2015-07-28 MED ORDER — ACETAMINOPHEN 650 MG RE SUPP: 650.0000 mg | Freq: Four times a day (QID) | RECTAL | Status: DC | PRN

## 2015-07-28 MED ORDER — SODIUM CHLORIDE 0.9% FLUSH
3.0000 mL | Freq: Two times a day (BID) | INTRAVENOUS | Status: DC
  Administered 2015-07-28 – 2015-07-31 (×4): 3 mL via INTRAVENOUS

## 2015-07-28 MED ORDER — FUROSEMIDE 10 MG/ML IJ SOLN
40.0000 mg | Freq: Two times a day (BID) | INTRAMUSCULAR | Status: DC
  Administered 2015-07-29 – 2015-07-31 (×7): 40 mg via INTRAVENOUS
  Filled 2015-07-28 (×7): qty 4

## 2015-07-28 MED ORDER — FUROSEMIDE 10 MG/ML IJ SOLN
60.0000 mg | Freq: Once | INTRAMUSCULAR | Status: AC
  Administered 2015-07-28: 60 mg via INTRAVENOUS
  Filled 2015-07-28: qty 8

## 2015-07-28 MED ORDER — BISACODYL 5 MG PO TBEC: 5.0000 mg | DELAYED_RELEASE_TABLET | Freq: Every day | ORAL | Status: DC | PRN

## 2015-07-28 NOTE — ED Provider Notes (Signed)
[facility] Emergency Department Provider Note  ____________________________________________    I have reviewed the triage vital signs and the nursing notes.   HISTORY  Chief Complaint CHF symptoms     HPI Lee Gutierrez is a 82 y.o. male who presents with shortness of breath and 20 pound weight gain. Patient has a history of CHF as well as a aortic dissection which is medically managed. He also has atrial fibrillation but is not on anticoagulation because of the dissection. He had developed diarrhea over the last few weeks and his Lasix dose was cut and he was started on prednisone. Reportedly over the last week he has gained approximately 20 pounds of fluid weight. He is starting to have shortness of breath which is worse with exertion. No chest pain     Past Medical History  Diagnosis Date  . Hypertension   . Arthritis   . Coronary artery disease   . 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 5 x 4.9, proximal descending thoracic aortic aorta measuring 5.8 x 5.1, the aortic 5.6 x 5.5 and distal thoracic aorta 3.9 x 4.1, His infrarenal aneurysm measured 3 x 2.7.  . Shortness of breath   . Aortic dissection ([place])   . Dysrhythmia     atrial fib  . Blood transfusion   . CHF, acute on chronic ([place]) 03/24/2011  . HOH (hard of hearing)     bilateral hearing aids  . Hx of echocardiogram 12/2011    EF 40% showed severe LA dilation, He had moderate concentric LHV. He has moderate inferior wall hypokinesis. There was evidence for pulmonary hypertension with an estimated RV systolic pressure at 62. He had moderate TR  . History of stress test 12/2011    Showed mild inferior thinning felt most likely due to attenuation artifact.    Patient Active Problem List   Diagnosis Date Noted  . Gastroenteritis 05/31/2015  . Injury of toe on right foot 05/23/2015  . Preventative health care 12/25/2014  . Chronic combined systolic and  diastolic heart failure ([place]) 12/25/2014  . Advance directive discussed with patient 12/25/2014  . Acute combined systolic and diastolic heart failure ([place]) 10/21/2014  . Hyperlipidemia LDL goal <70 10/21/2014  . Hearing loss 07/05/2014  . Fecal incontinence 05/09/2013  . Aortic aneurysm, thoracic ([place]) 09/11/2011  . Pulmonary hypertension ([place]) 03/26/2011  . VENTRAL HERNIA 11/16/2009  . Coronary atherosclerosis 07/26/2009  . HYPERLIPIDEMIA 02/12/2009  . Essential hypertension 02/10/2008  . Atrial fibrillation ([place]) 02/10/2008  . Dissection of aorta ([place]) 02/10/2008  . BPH (benign prostatic hypertrophy) 02/10/2008  . Osteoarthritis of ankle 02/10/2008    Past Surgical History  Procedure Laterality Date  . Replacement total knee      bilaterally  . Joint replacement      Bilateral knee  . Tonsillectomy    . Hernia repair    . Cardiac catheterization    . Coronary artery bypass graft  Feb 2011  . Ankle fusion  04/22/2012    Procedure: ARTHRODESIS ANKLE;  Surgeon: Wylene Simmer, MD;  Location: [facility];  Service: Orthopedics;  Laterality: Left;  Left Ankle and Subtalar Arthrodesis     Current Outpatient Rx  Name  Route  Sig  Dispense  Refill  . amoxicillin (AMOXIL) 500 MG capsule      TAKE 4 CAPSULES BY MOUTH 1 HOUR PRIOR TO DENTAL APPT      1   . aspirin 81 MG tablet  Oral   Take 81 mg by mouth daily.         . chlorhexidine (PERIDEX) 0.12 % solution      RINSE FOR 30 SECONDS WITH 1/2 OUNCE TWICE A DAY      12   . Cholecalciferol (VITAMIN D) 2000 UNITS tablet   Oral   Take 2,000 Units by mouth daily.         . digoxin (LANOXIN) 0.125 MG tablet      TAKE 1/2 TABLET (0.0625MG ) BY MOUTH ONCE DAILY   90 tablet   1   . enalapril (VASOTEC) 20 MG tablet   Oral   Take 1 tablet (20 mg total) by mouth 2 (two) times daily.   180 tablet   3   . furosemide (LASIX) 40 MG tablet      TAKE 80 MG (2 TABLETS) IN THE MORNING AND 80 MG (2 TABLETS) IN THE EARLY  AFTERNOON.   360 tablet   5   . Melatonin 10 MG TABS   Oral   Take 1 tablet by mouth at bedtime.         . metolazone (ZAROXOLYN) 2.5 MG tablet   Oral   Take 2.5 mg by mouth as directed. Take 1 by mouth once a week.         . metoprolol (LOPRESSOR) 50 MG tablet      TAKE 1 TABLET (50 MG TOTAL) BY MOUTH 2 (TWO) TIMES DAILY. Patient taking differently: TAKE 1 TABLET (50 MG TOTAL) BY MOUTH AS NEEDED   180 tablet   3   . Multiple Vitamin (MULTIVITAMIN WITH MINERALS) TABS   Oral   Take 1 tablet by mouth daily. Centrum Silver         . potassium chloride (K-DUR) 10 MEQ tablet   Oral   Take 20 mEq by mouth 3 (three) times daily. And take 20 meq 4 times a day on the day you take Metolazone         . silver sulfADIAZINE (SILVADENE) 1 % cream   Topical   Apply 1 application topically daily.   50 g   0   . simvastatin (ZOCOR) 20 MG tablet      TAKE 1 TABLET BY MOUTH AT BEDTIME.   90 tablet   3   . tamsulosin (FLOMAX) 0.4 MG CAPS capsule      TAKE 1 CAPSULE (0.4 MG TOTAL) BY MOUTH DAILY.   90 capsule   3   . traMADol (ULTRAM) 50 MG tablet      TAKE 1 TABLET BY MOUTH 3 TIMES A DAY AS NEEDED   90 tablet   0     Not to exceed 5 additional fills before 08/08/2015 ...     Allergies Review of patient's allergies indicates no known allergies.  Family History  Problem Relation Age of Onset  . Hypertension Father   . COPD Brother   . Diabetes Neg Hx   . Cancer Neg Hx   . Heart attack Neg Hx   . Stroke Neg Hx     Social History Social History  Substance Use Topics  . Smoking status: Former Smoker    Quit date: 02/04/1963  . Smokeless tobacco: Never Used  . Alcohol Use: No    Review of Systems  Constitutional: Negative for fever. Eyes: Negative for redness ENT: Negative for sore throat Cardiovascular: Negative for chest pain Respiratory: Mild shortness of breath Gastrointestinal: Negative for abdominal pain and positive for diarrhea Genitourinary:  Negative for dysuria. Musculoskeletal: Negative for back pain. Skin: Negative for rash. Neurological: Negative for focal weakness Psychiatric: no anxiety    ____________________________________________   PHYSICAL EXAM:  VITAL SIGNS: ED Triage Vitals  Enc Vitals Group     BP 07/28/15 1030 107/81 mmHg     Pulse Rate 07/28/15 1030 63     Resp 07/28/15 1030 22     Temp 07/28/15 1030 97.5 F (36.4 C)     Temp Source 07/28/15 1030 Oral     SpO2 07/28/15 1030 99 %     Weight 07/28/15 1030 244 lb (110.678 kg)     Height 07/28/15 1030 5\' 11"  (1.803 m)     Head Cir --      Peak Flow --      Pain Score --      Pain Loc --      Pain Edu? --      Excl. in [place]? --      Constitutional: Alert and oriented. Well appearing and in no distress. Pleasant and interactive  Eyes: Conjunctivae are normal. No erythema or injection ENT   Head: Normocephalic and atraumatic.   Mouth/Throat: Mucous membranes are moist. Cardiovascular: Normal rate, regular rhythm. Normal and symmetric distal pulses are present in the upper extremities.  Respiratory: Mild tachypnea. Breath sounds are clear and equal bilaterally.  Gastrointestinal: Soft and non-tender in all quadrants. No distention. There is no CVA tenderness. Genitourinary: deferred Musculoskeletal: Nontender with normal range of motion in all extremities. 2+ lower extremity edema Neurologic:  Normal speech and language. No gross focal neurologic deficits are appreciated. Skin:  Skin is warm, dry and intact. No rash noted. Psychiatric: Mood and affect are normal. Patient exhibits appropriate insight and judgment.  ____________________________________________    LABS (pertinent positives/negatives)  Labs Reviewed  BASIC METABOLIC PANEL - Abnormal; Notable for the following:    Sodium 130 (*)    Potassium 5.5 (*)    Chloride 100 (*)    Glucose, Bld 104 (*)    BUN 63 (*)    Creatinine, Ser 1.58 (*)    GFR calc non Af Amer 39 (*)     GFR calc Af Amer 45 (*)    All other components within normal limits  CBC - Abnormal; Notable for the following:    RBC 3.54 (*)    Hemoglobin 11.7 (*)    HCT 35.4 (*)    MCV 100.1 (*)    RDW 16.1 (*)    All other components within normal limits  TROPONIN I - Abnormal; Notable for the following:    Troponin I 0.04 (*)    All other components within normal limits  BRAIN NATRIURETIC PEPTIDE - Abnormal; Notable for the following:    B Natriuretic Peptide 1000.0 (*)    All other components within normal limits    ____________________________________________   EKG  ED ECG REPORT I, Lavonia Drafts, the attending physician, personally viewed and interpreted this ECG.   Date: 07/28/2015  EKG Time: 10:35 AM  Rate: 73  Rhythm: atrial fibrillation, rate 73  Axis: Normal  Intervals:nonspecific intraventricular conduction delay  ST&T Change: Nonspecific   ____________________________________________    RADIOLOGY  No pulmonary edema  ____________________________________________   PROCEDURES  Procedure(s) performed: none  Critical Care performed: none  ____________________________________________   INITIAL IMPRESSION / ASSESSMENT AND PLAN / ED COURSE  Pertinent labs & imaging results that were available during my care of the patient were reviewed by me and considered in my medical  decision making (see chart for details).  Patient presents with worsening shortness of breath and weight gain likely related to being off his Lasix. Patient has multiple comorbidities including aortic dissection, atrial fibrillation, CABG, CHF. He has a mildly elevated troponin today likely secondary to fluid overload.  He will require admission for diuresis  ____________________________________________   FINAL CLINICAL IMPRESSION(S) / ED DIAGNOSES  Final diagnoses:  Hyponatremia  Acute on chronic systolic congestive heart failure (HCC)          Lavonia Drafts, MD 07/28/15  1250

## 2015-07-28 NOTE — Telephone Encounter (Signed)
Lee Gutierrez is a 82 y.o. male with a hx of Type III aortic dissection, CAD s/p CABG, ICM, systolic CHF.  His daughter calls in today with continued symptoms of weight gain.  Patient has been in touch with [name], NP by [place].  Patient has been advised to take Metolazone daily.  He is on Lasix 40 bid and took Metolazone 2.5 QD the last 3 days. He is up 24 lbs. + dyspnea. + LE edema + cough.  Plan - I have advised [name], the patient's daughter, to take him to the nearest ED.  She will take him to [facility].  She agrees with this plan.   [name], PA-C   07/28/2015 9:53 AM

## 2015-07-28 NOTE — ED Notes (Signed)
Pending room assignment. Will transport when report is called.

## 2015-07-28 NOTE — ED Notes (Signed)
Bed ready. Report being called.

## 2015-07-28 NOTE — H&P (Addendum)
[facility] [place] NAME: Lee Gutierrez    MR#:  [id number]  DATE OF BIRTH:  11-Sep-1930  DATE OF ADMISSION:  07/28/2015  PRIMARY CARE PHYSICIAN: Viviana Simpler, MD   REQUESTING/REFERRING PHYSICIAN: Dr. Corky Downs  CHIEF COMPLAINT:  20 pound weight gain and lower extremity edema HISTORY OF PRESENT ILLNESS:  Lee Gutierrez  is a 82 y.o. male with a known history of Chronic systolic and diastolic heart failure with ejection fraction of 40%, atrial fibrillation thoracic aortic aneurysm and essential hypertension who presents with above complaint. Over the past week patient had increasing weight gain. He was recently started on steroids for chronic diarrhea. He has tested negative for C. difficile 2 and other viruses. Since the time of steroids he has increased his weight 2/20 pounds. He was instructed to take metolazone since Thursday by the PA of cardiology.Marland Kitchen He also is complaining of some shortness of breath. Denies PND or orthopnea. He also denies chest pain.  PAST MEDICAL HISTORY:   Past Medical History  Diagnosis Date  . Hypertension   . Arthritis   . Coronary artery disease   . 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 5 x 4.9, proximal descending thoracic aortic aorta measuring 5.8 x 5.1, the aortic 5.6 x 5.5 and distal thoracic aorta 3.9 x 4.1, His infrarenal aneurysm measured 3 x 2.7.  . Shortness of breath   . Aortic dissection ([place])   . Dysrhythmia     atrial fib  . Blood transfusion   . CHF, acute on chronic ([place]) 03/24/2011  . HOH (hard of hearing)     bilateral hearing aids  . Hx of echocardiogram 12/2011    EF 40% showed severe LA dilation, He had moderate concentric LHV. He has moderate inferior wall hypokinesis. There was evidence for pulmonary hypertension with an estimated RV systolic pressure at 62. He had moderate TR  . History of stress test 12/2011    Showed mild inferior  thinning felt most likely due to attenuation artifact.    PAST SURGICAL HISTORY:   Past Surgical History  Procedure Laterality Date  . Replacement total knee      bilaterally  . Joint replacement      Bilateral knee  . Tonsillectomy    . Hernia repair    . Cardiac catheterization    . Coronary artery bypass graft  Feb 2011  . Ankle fusion  04/22/2012    Procedure: ARTHRODESIS ANKLE;  Surgeon: Wylene Simmer, MD;  Location: [facility];  Service: Orthopedics;  Laterality: Left;  Left Ankle and Subtalar Arthrodesis     SOCIAL HISTORY:   Social History  Substance Use Topics  . Smoking status: Former Smoker    Quit date: 02/04/1963  . Smokeless tobacco: Never Used  . Alcohol Use: No    FAMILY HISTORY:   Family History  Problem Relation Age of Onset  . Hypertension Father   . COPD Brother   . Diabetes Neg Hx   . Cancer Neg Hx   . Heart attack Neg Hx   . Stroke Neg Hx     DRUG ALLERGIES:  No Known Allergies   REVIEW OF SYSTEMS:  CONSTITUTIONAL: No fever, ++fatigue and generalized weakness.  EYES: No blurred or double vision.  EARS, NOSE, AND THROAT: No tinnitus or ear pain.  RESPIRATORY: No cough, positive shortness of breath and dyspnea exertion, no wheezing or hemoptysis.  CARDIOVASCULAR: No chest pain, orthopnea, positive  lower extremity edema.  GASTROINTESTINAL: No nausea, vomiting, diarrhea or abdominal pain.  GENITOURINARY: No dysuria, hematuria.  ENDOCRINE: No polyuria, nocturia,  HEMATOLOGY: No anemia, easy bruising or bleeding SKIN: No rash or lesion. MUSCULOSKELETAL: No joint pain or arthritis.   NEUROLOGIC: No tingling, numbness, weakness.  PSYCHIATRY: No anxiety or depression.   MEDICATIONS AT HOME:   Prior to Admission medications   Medication Sig Start Date End Date Taking? Authorizing Provider  amoxicillin (AMOXIL) 500 MG capsule TAKE 4 CAPSULES BY MOUTH 1 HOUR PRIOR TO DENTAL APPT 01/08/15  Yes Historical Provider, MD  aspirin 81 MG tablet Take 81 mg  by mouth daily.   Yes Historical Provider, MD  budesonide (ENTOCORT EC) 3 MG 24 hr capsule Take 9 mg by mouth every morning. 07/20/15  Yes Historical Provider, MD  Cholecalciferol (VITAMIN D) 2000 UNITS tablet Take 2,000 Units by mouth daily.   Yes Historical Provider, MD  digoxin (LANOXIN) 0.125 MG tablet TAKE 1/2 TABLET (0.0625MG ) BY MOUTH ONCE DAILY 04/19/15  Yes Troy Sine, MD  enalapril (VASOTEC) 20 MG tablet Take 1 tablet (20 mg total) by mouth 2 (two) times daily. 03/26/15  Yes Troy Sine, MD  furosemide (LASIX) 40 MG tablet TAKE 80 MG (2 TABLETS) IN THE MORNING AND 80 MG (2 TABLETS) IN THE EARLY AFTERNOON. Patient taking differently: TAKE 40mg  by mouth 2 times a day. 05/25/15  Yes Burtis Junes, NP  Melatonin 10 MG TABS Take 10 mg by mouth at bedtime.    Yes Historical Provider, MD  metolazone (ZAROXOLYN) 2.5 MG tablet Take 2.5 mg by mouth as directed. Take 1 by mouth once a week.   Yes Historical Provider, MD  metoprolol (LOPRESSOR) 50 MG tablet TAKE 1 TABLET (50 MG TOTAL) BY MOUTH 2 (TWO) TIMES DAILY. Patient taking differently: No sig reported 11/10/14  Yes Troy Sine, MD  Multiple Vitamin (MULTIVITAMIN WITH MINERALS) TABS Take 1 tablet by mouth daily. Centrum Silver   Yes Historical Provider, MD  potassium chloride (K-DUR) 10 MEQ tablet Take 20 mEq by mouth See admin instructions. Take 1 tablet by mouth 3 times a day, and take 1 tablet by mouth 4 times a day on the day you take Metolazone.   Yes Historical Provider, MD  Probiotic Product (ALIGN) 4 MG CAPS Take 4 mg by mouth every morning.   Yes Historical Provider, MD  silver sulfADIAZINE (SILVADENE) 1 % cream Apply 1 application topically daily. 05/23/15  Yes Venia Carbon, MD  simvastatin (ZOCOR) 20 MG tablet TAKE 1 TABLET BY MOUTH AT BEDTIME. 05/02/15  Yes Troy Sine, MD  tamsulosin (FLOMAX) 0.4 MG CAPS capsule TAKE 1 CAPSULE (0.4 MG TOTAL) BY MOUTH DAILY. 03/12/15  Yes Venia Carbon, MD  traMADol (ULTRAM) 50 MG  tablet TAKE 1 TABLET BY MOUTH 3 TIMES A DAY AS NEEDED 05/02/15  Yes Venia Carbon, MD      VITAL SIGNS:  Blood pressure 124/85, pulse 72, temperature 97.5 F (36.4 C), temperature source Oral, resp. rate 20, height 5\' 11"  (1.803 m), weight 110.678 kg (244 lb), SpO2 99 %.  PHYSICAL EXAMINATION:  GENERAL:  82 y.o.-year-old patient lying in the bed with no acute distress. Hard of hearing EYES: Pupils equal, round, reactive to light and accommodation. No scleral icterus. Extraocular muscles intact.  HEENT: Head atraumatic, normocephalic. Oropharynx and nasopharynx clear.  NECK:  Supple, no jugular venous distention. No thyroid enlargement, no tenderness.  LUNGS: Normal breath sounds bilaterally, no wheezing, rales,rhonchi or crepitation.  No use of accessory muscles of respiration.  CARDIOVASCULAR: Irregular, irregular 3/6 SEM NO rubs, or gallops.  ABDOMEN: Soft, nontender,distended. Bowel sounds present. No organomegaly or mass.  EXTREMITIES: 4+ pitting edema all the way to mid abdomen NEUROLOGIC: Cranial nerves II through XII are grossly intact. No focal deficits. PSYCHIATRIC: The patient is alert and oriented x 3.  SKIN: No obvious rash, lesion, or ulcer.   LABORATORY PANEL:   CBC  Recent Labs Lab 07/28/15 1034  WBC 6.5  HGB 11.7*  HCT 35.4*  PLT 172   ------------------------------------------------------------------------------------------------------------------  Chemistries   Recent Labs Lab 07/28/15 1034  NA 130*  K 5.5*  CL 100*  CO2 23  GLUCOSE 104*  BUN 63*  CREATININE 1.58*  CALCIUM 9.0   ------------------------------------------------------------------------------------------------------------------  Cardiac Enzymes  Recent Labs Lab 07/28/15 1034  TROPONINI 0.04*   ------------------------------------------------------------------------------------------------------------------  RADIOLOGY:  Dg Chest 2 View  07/28/2015  CLINICAL DATA:   Shortness of breath and recent weight gain EXAM: CHEST  2 VIEW COMPARISON:  None. FINDINGS: There is left base atelectasis. The lungs elsewhere are clear. There is cardiomegaly with pulmonary vascular within normal limits. The aorta is diffusely tortuous and prominent with evidence suggesting aneurysmal dilatation. There is atherosclerotic calcification throughout the aorta. Patient is status post coronary artery bypass grafting. No adenopathy. There is degenerative change in the thoracic spine. IMPRESSION: Atelectasis left base.  Lungs otherwise clear. Generalized cardiomegaly. Suspect thoracic aortic aneurysm. Contrast enhanced chest CT would be the imaging study of choice for further evaluation and quantification of the thoracic aorta if further imaging is felt to be advisable at this time. Electronically Signed   By: Lowella Grip III M.D.   On: 07/28/2015 11:50    EKG:   Atrial fibrillation heart rate is 73 no ST elevation or depression  IMPRESSION AND PLAN:    82 year old male with history of combined systolic and diastolic heart failure EF of 40%, atrial fibrillation and known thoracic abdominal aortic aneurysm who presents with hyponatremia and acute CHF exacerbation.  1. Acute on chronic combined systolic and diastolic heart failure: I am suspecting that the steroids contributed to CHF and weight gain. Start IV Lasix. [facility]. Monitor input and output and daily weight Monitor BMP  2. Chronic atrial fibrillation: Continue digoxin, aspirin, metoprolol   3. Hyponatremia: This is due to CHF exacerbation. Monitor sodium wall and Lasix.  4. Hyperkalemia: This is due to acute kidney injury from CHF exacerbation. Hold ACE inhibitor for now. Insulin, dextrose and Kayexalate for hyperkalemia.  5. Essential hypertension: Continue metoprolol and monitor blood pressure.  6. BPH: Continue Tamsulosin. All the records are reviewed and case discussed with ED  provider. Management plans discussed with the patient and he is in agreement.   7. Elevated troponin: This is likely due to demand ischemia. Trend troponins.  8. Acute kidney injury: This is due to CHF exacerbation. Monitor creatinine while on Lasix.  9. Chronic diarrhea: GI consultation and for now will discontinue steroids as this may be culprit to CHF exacerbation.Marland Kitchen He was only on steroids for less than 1 week. CODE STATUS: DNR  TOTAL TIME TAKING CARE OF THIS PATIENT: 50 minutes.    Keirsten Matuska M.D on 07/28/2015 at 1:43 PM  Between 7am to 6pm - Pager - [phone] After 6pm go to [email or web address] - password [email or web address] [facility]  Office  [phone]  CC: Primary care physician; Viviana Simpler, MD

## 2015-07-28 NOTE — Progress Notes (Signed)
PHARMACIST - PHYSICIAN ORDER COMMUNICATION  CONCERNING: P&T Medication Policy on Herbal Medications  DESCRIPTION:  This patient's order for:  Melatonin  has been noted.  This product(s) is classified as an "herbal" or natural product. Due to a lack of definitive safety studies or FDA approval, nonstandard manufacturing practices, plus the potential risk of unknown drug-drug interactions while on inpatient medications, the Pharmacy and Therapeutics Committee does not permit the use of "herbal" or natural products of this type within [facility]   ACTION TAKEN: The pharmacy department is unable to verify this order at this time and your patient has been informed of this safety policy. Please reevaluate patient's clinical condition at discharge and address if the herbal or natural product(s) should be resumed at that time.  Paulina Fusi, PharmD, BCPS 07/28/2015 4:48 PM

## 2015-07-28 NOTE — ED Notes (Addendum)
Pt has CHF and has gained 20 lb over last week.  Always has some shortness of breath but denies CP.  Notably [facility] when speaking with RN. Has had diarrhea; was negative for cdif per daughter.

## 2015-07-29 ENCOUNTER — Encounter: Payer: Self-pay | Admitting: Internal Medicine

## 2015-07-29 DIAGNOSIS — I10 Essential (primary) hypertension: Secondary | ICD-10-CM

## 2015-07-29 DIAGNOSIS — N183 Chronic kidney disease, stage 3 (moderate): Secondary | ICD-10-CM

## 2015-07-29 DIAGNOSIS — I5043 Acute on chronic combined systolic (congestive) and diastolic (congestive) heart failure: Secondary | ICD-10-CM

## 2015-07-29 DIAGNOSIS — I482 Chronic atrial fibrillation: Secondary | ICD-10-CM

## 2015-07-29 LAB — BASIC METABOLIC PANEL
Anion gap: 10 (ref 5–15)
BUN: 60 mg/dL — AB (ref 6–20)
CHLORIDE: 99 mmol/L — AB (ref 101–111)
CO2: 25 mmol/L (ref 22–32)
CREATININE: 1.32 mg/dL — AB (ref 0.61–1.24)
Calcium: 9.4 mg/dL (ref 8.9–10.3)
GFR calc Af Amer: 56 mL/min — ABNORMAL LOW (ref >60)
GFR calc non Af Amer: 49 mL/min — ABNORMAL LOW (ref >60)
GLUCOSE: 80 mg/dL (ref 65–99)
POTASSIUM: 3.4 mmol/L — AB (ref 3.5–5.1)
SODIUM: 134 mmol/L — AB (ref 135–145)

## 2015-07-29 LAB — CBC
HEMATOCRIT: 35.7 % — AB (ref 40.0–52.0)
Hemoglobin: 12 g/dL — ABNORMAL LOW (ref 13.0–18.0)
MCH: 33.3 pg (ref 26.0–34.0)
MCHC: 33.6 g/dL (ref 32.0–36.0)
MCV: 99.1 fL (ref 80.0–100.0)
PLATELETS: 164 10*3/uL (ref 150–440)
RBC: 3.6 MIL/uL — ABNORMAL LOW (ref 4.40–5.90)
RDW: 15.9 % — AB (ref 11.5–14.5)
WBC: 6.2 10*3/uL (ref 3.8–10.6)

## 2015-07-29 MED ORDER — BUDESONIDE 3 MG PO CPEP
6.0000 mg | ORAL_CAPSULE | Freq: Every day | ORAL | Status: DC
  Administered 2015-07-29 – 2015-08-01 (×4): 6 mg via ORAL
  Filled 2015-07-29 (×4): qty 2

## 2015-07-29 NOTE — Consult Note (Signed)
CARDIOLOGY CONSULT NOTE     Primary Care Physician: Viviana Simpler, MD Referring Physician:  Hospitalist Primary Cardiologist:  Dr Claiborne Billings  Admit Date: 07/28/2015  Reason for consultation:  CHF  Lee Gutierrez is a 82 y.o. male with a h/o combined systolic/ diastolic CHF (EF [id number]), permanent atrial fibrillation, CAD, and HTN admitted with progressive SOB.  He has been followed by Dr Claiborne Billings in the past and has done reasonably well for his age.  More recently, he has had difficulty with diarrhea with 40 lb weight loss.  He was recently placed on steroids with improvement in diarrhea.  He has also started to retain fluid.  He contacted Dr Evette Georges office after substantial weight gain and received metolazone.  Unfortunately, he did not have adequate diuresis.  He therefore presents to [facility] for additional management. Since here, he has diuresed 5L (7 lbs).  He is started to feel better.  SOB is slowly improving.  Today, he denies symptoms of palpitations, chest pain,  dizziness, presyncope, syncope, or neurologic sequela. The patient is tolerating medications without difficulties and is otherwise without complaint today.   Past Medical History  Diagnosis Date  . Hypertension   . Arthritis   . Coronary artery disease   . 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 5 x 4.9, proximal descending thoracic aortic aorta measuring 5.8 x 5.1, the aortic 5.6 x 5.5 and distal thoracic aorta 3.9 x 4.1, His infrarenal aneurysm measured 3 x 2.7.  . Blood transfusion   . Chronic combined systolic and diastolic CHF (congestive heart failure) ([place]) 03/24/2011  . HOH (hard of hearing)     bilateral hearing aids  . Hx of echocardiogram 12/2011    EF 40% showed severe LA dilation, He had moderate concentric LHV. He has moderate inferior wall hypokinesis. There was evidence for pulmonary hypertension with an estimated RV systolic pressure at 62. He had moderate TR  . History of stress  test 12/2011    Showed mild inferior thinning felt most likely due to attenuation artifact.  . Chronic atrial fibrillation ([facility])     chads2vasc score is 4   Past Surgical History  Procedure Laterality Date  . Replacement total knee      bilaterally  . Joint replacement      Bilateral knee  . Tonsillectomy    . Hernia repair    . Cardiac catheterization    . Coronary artery bypass graft  Feb 2011  . Ankle fusion  04/22/2012    Procedure: ARTHRODESIS ANKLE;  Surgeon: Wylene Simmer, MD;  Location: [facility];  Service: Orthopedics;  Laterality: Left;  Left Ankle and Subtalar Arthrodesis     . acidophilus  1 capsule Oral [id number]  . aspirin EC  81 mg Oral Daily  . budesonide  6 mg Oral Daily  . cholecalciferol  2,000 Units Oral Daily  . digoxin  0.125 mg Oral Daily  . enoxaparin (LOVENOX) injection  40 mg Subcutaneous Q24H  . furosemide  40 mg Intravenous Q12H  . metoprolol  50 mg Oral BID  . multivitamin with minerals  1 tablet Oral Daily  . simvastatin  20 mg Oral QHS  . sodium chloride flush  3 mL Intravenous Q12H  . sodium chloride flush  3 mL Intravenous Q12H  . sodium polystyrene  30 g Oral Once  . tamsulosin  0.4 mg Oral QPC supper      No Known Allergies  Social History   Social History  .  Marital Status: Widowed    Spouse Name: N/A  . Number of Children: 3  . Years of Education: N/A   Occupational History  . retired Architect    Social History Main Topics  . Smoking status: Former Smoker    Quit date: 02/04/1963  . Smokeless tobacco: Never Used  . Alcohol Use: No  . Drug Use: No  . Sexual Activity: Not Currently   Other Topics Concern  . Not on file   Social History Narrative   Has living will.   Daughter [name] has health care POA    Would want resuscitation attempts but no prolonged artificial ventilation.   Would not want tube feedings if cognitively aware    Family History  Problem Relation Age of Onset  . Hypertension Father   . COPD Brother     . Diabetes Neg Hx   . Cancer Neg Hx   . Heart attack Neg Hx   . Stroke Neg Hx     ROS- All systems are reviewed and negative except as per the HPI above  Physical Exam: Telemetry: Filed Vitals:   07/29/15 0444 07/29/15 0950 07/29/15 1032 07/29/15 1206  BP: 114/65   100/51  Pulse: 70 83 77 82  Temp: 98 F (36.7 C)   98 F (36.7 C)  TempSrc: Oral   Oral  Resp: 20   18  Height:      Weight: 237 lb 14.4 oz (107.911 kg)     SpO2: 96% 95%  97%    GEN- The patient is elderly appearing, alert and oriented x 3 today.   Head- normocephalic, atraumatic Eyes-  Sclera clear, conjunctiva pink Ears- hearing intact Oropharynx- clear Neck- supple,   Lungs- bibasilar rales, normal work of breathing Heart- irregular rate and rhythm, 3/6 SEM LLSB GI- soft, NT, ND, + BS Extremities- no clubbing, cyanosis, +2 edema MS- diffuse atrophy Skin- no rash or lesion Psych- euthymic mood, full affect Neuro- strength and sensation are intact  EKG reveals afib with V rate 73 bpm, IVCD  Labs:   Lab Results  Component Value Date   WBC 6.2 07/29/2015   HGB 12.0* 07/29/2015   HCT 35.7* 07/29/2015   MCV 99.1 07/29/2015   PLT 164 07/29/2015    Recent Labs Lab 07/29/15 0635  NA 134*  K 3.4*  CL 99*  CO2 25  BUN 60*  CREATININE 1.32*  CALCIUM 9.4  GLUCOSE 80   Lab Results  Component Value Date   CKTOTAL 59 03/25/2011   CKMB 2.7 03/25/2011   TROPONINI 0.03 07/28/2015    Lab Results  Component Value Date   CHOL 96 12/25/2014   CHOL 120 02/10/2014   CHOL 113 03/21/2013   Lab Results  Component Value Date   HDL 43.30 12/25/2014   HDL 63 02/10/2014   HDL 60.10 03/21/2013   Lab Results  Component Value Date   LDLCALC 38 12/25/2014   LDLCALC 46 02/10/2014   LDLCALC 43 03/21/2013   Lab Results  Component Value Date   TRIG 74.0 12/25/2014   TRIG 56 02/10/2014   TRIG 50.0 03/21/2013   Lab Results  Component Value Date   CHOLHDL 2 12/25/2014   CHOLHDL 1.9 02/10/2014    CHOLHDL 2 03/21/2013   No results found for: LDLDIRECT    Radiology: CXR reviewed  Echo froj 02/26/15 is reviewed Dr Ermalinda Memos office notes are reviewed  ASSESSMENT AND PLAN:   1. Acute on chronic combined systolic/ diastolic CHF Making some improvement with  IV diuresis Would continue current lasix dosing and continue to diurese as creatinine allows 2 gram sodium restriction Strict Is/Os and dailys weights Add ace inhibitor if Cr remains stable  2. Permanent atrial fibrillation Rate controlled Not felt by Dr Claiborne Billings per his most recent note to be a candidate for anticoagulation  3. HTn Stable No change required today Add ace inhibitor when able  4. CRI Creatinine is stable Add ace inhibitor when able  Cardiology to follow while here   Thompson Grayer, MD 07/29/2015  12:47 PM

## 2015-07-29 NOTE — Evaluation (Signed)
Physical Therapy Evaluation Patient Details Name: Lee Gutierrez MRN: [id number] DOB: Jul 27, 1930 Today's Date: 07/29/2015   History of Present Illness  Lee Gutierrez is an 82yo white male, who comes to [facility] on 3/18 c SOB and weight gain x 1 week. PMH: systolic and diastolic heart failure, s/p CABGx3 (per daughter), EF 40%, afib, thoracic aortic aneurysm, HTN, bilat TKA, and unilateral ankle fusion. Pt moved in with Daughter and son-in-law 6 months ago, and at baseline remains seated on rolator for most HH  mobility, but does walk limited community distances daily for mass.   Clinical Impression  At evaluation, pt is received semirecumbent in bed upon entry, family/caregiver present. The pt is asleep but awakened easily and agreeable to participate. No acute distress noted at this time, although patient attempts to void 3 times during session with minimal success. The pt is alert, pleasant, conversational, and following simple and multi-step commands consistently. Family reports 3 falls in the last 6 months, wherein the patient has slid off of his rollator during functional activity; pt demonstrates no LOB in session, demonstrates good safety awareness, and use of adaptive equipment to maintain balance. Pt global strength as screened during functional mobility assessment presents with moderate impairment, which family asserts is baseline level since the last 3-6 months due to recurrent fluid congestion issues. The patient performs all mobility this session with minA or less. The patient has good socail support and adequate equipment at home. PT discusses a suggested transport chair for mobility at home for a more stable seated surface during mobility. SaO2 and HR are WFL during exertion on room air.   Patient presenting with impairment of strength, range of motion, balance, and activity tolerance, limiting ability to perform IADL and mobility tasks at safe a level. These deficits are consistent with a  gradual decline over the past 6 months related to multiple problems. Pt is safe for DC to home when medically appropriate, with PRN assistance from family. All additional PT services can be met at the next venue of care. PT signing off.     Follow Up Recommendations Outpatient PT    Equipment Recommendations  Other (comment) (Transport chair. )    Recommendations for Other Services       Precautions / Restrictions Precautions Precautions: Fall      Mobility  Bed Mobility Overal bed mobility: Modified Independent                Transfers Overall transfer level: Needs assistance Equipment used: Rolling walker (2 wheeled) Transfers: Sit to/from Stand Sit to Stand: Mod assist;Min assist         General transfer comment: MinA from EOB, ModA from toilet.   Ambulation/Gait Ambulation/Gait assistance: Supervision Ambulation Distance (Feet): 150 Feet Assistive device: Rolling walker (2 wheeled)     Gait velocity interpretation: <1.8 ft/sec, indicative of risk for recurrent falls General Gait Details: forward flexed, poor joint range in BLE, poor trunk sway. Family asserts is baseline.   Stairs            Wheelchair Mobility    Modified Rankin (Stroke Patients Only)       Balance Overall balance assessment: History of Falls;Modified Independent   Sitting balance-Leahy Scale: Good     Standing balance support: During functional activity;Single extremity supported Standing balance-Leahy Scale: Fair                               Pertinent  Vitals/Pain Pain Assessment: No/denies pain    Home Living Family/patient expects to be discharged to:: Private residence Living Arrangements: Children Available Help at Discharge: Family;Available 24 hours/day Type of Home: House Home Access: Ramped entrance     Home Layout: Two level;Full bath on main level Home Equipment: Walker - 4 wheels;Bedside commode;Shower seat - built in Additional  Comments: Lift chair    Prior Function Level of Independence: Needs assistance   Gait / Transfers Assistance Needed: ModI for transfers (mod-max effort), household distances slowly with rolator bid.   ADL's / Homemaking Assistance Needed: Indep in ADL         Hand Dominance        Extremity/Trunk Assessment   Upper Extremity Assessment: Generalized weakness;Overall [facility] for tasks assessed           Lower Extremity Assessment: Generalized weakness;Overall WFL for tasks assessed      Cervical / Trunk Assessment: Kyphotic (forward flexed. )  Communication   Communication: HOH  Cognition Arousal/Alertness: Awake/alert Behavior During Therapy: WFL for tasks assessed/performed Overall Cognitive Status: Within Functional Limits for tasks assessed                      General Comments      Exercises        Assessment/Plan    PT Assessment All further PT needs can be met in the next venue of care  PT Diagnosis Difficulty walking;Generalized weakness;Abnormality of gait   PT Problem List Decreased strength;Decreased range of motion;Decreased activity tolerance;Decreased balance;Obesity;Cardiopulmonary status limiting activity  PT Treatment Interventions     PT Goals (Current goals can be found in the Care Plan section) Acute Rehab PT Goals Patient Stated Goal: improve safety at home and maintain indep in mobility.  PT Goal Formulation: With family Time For Goal Achievement: 08/12/15 Potential to Achieve Goals: Good    Frequency     Barriers to discharge        Co-evaluation               End of Session Equipment Utilized During Treatment: Gait belt Activity Tolerance: Patient tolerated treatment well;No increased pain Patient left: in bed;with call bell/phone within reach;with bed alarm set;with family/visitor present Nurse Communication: Other (comment)         Time: 5784-6962 PT Time Calculation (min) (ACUTE ONLY): 31 min   Charges:    PT Evaluation $PT Eval High Complexity: 1 Procedure PT Treatments $Therapeutic Activity: 8-22 mins   PT G Codes:        10:08 AM, August 20, 2015 Etta Grandchild, PT, DPT PRN Physical Therapist - [place] License # [id number] [phone] ([phone] (mobile)

## 2015-07-29 NOTE — Telephone Encounter (Signed)
My last note to her thru My Chart was to take him to the ER.   Thanks Cecille Rubin

## 2015-07-29 NOTE — Plan of Care (Signed)
Problem: Cardiac: Goal: Ability to achieve and maintain adequate cardiopulmonary perfusion will improve Outcome: Progressing Cont to monitor I&Os and daily wgt.

## 2015-07-29 NOTE — Progress Notes (Signed)
[place] [facility] [place] NAME: Lee Gutierrez    MR#:  [id number]  DATE OF BIRTH:  Feb 07, 1931  SUBJECTIVE:  CHIEF COMPLAINT:  Patient denies any chest pain shortness of breath is better. Has chronic history of aortic dissection , not a surgical candidate .Good urine output as reported by patient's daughter at bedside  REVIEW OF SYSTEMS:  CONSTITUTIONAL: No fever, fatigue or weakness.  EYES: No blurred or double vision.  EARS, NOSE, AND THROAT: No tinnitus or ear pain.  RESPIRATORY: No cough, reports improved shortness of breath, denies wheezing or hemoptysis.  CARDIOVASCULAR: No chest pain, orthopnea, edema.  GASTROINTESTINAL: No nausea, vomiting, diarrhea or abdominal pain.  GENITOURINARY: No dysuria, hematuria.  ENDOCRINE: No polyuria, nocturia,  HEMATOLOGY: No anemia, easy bruising or bleeding SKIN: No rash or lesion. MUSCULOSKELETAL: No joint pain or arthritis.   NEUROLOGIC: No tingling, numbness, weakness.  PSYCHIATRY: No anxiety or depression.   DRUG ALLERGIES:  No Known Allergies  VITALS:  Blood pressure 100/51, pulse 82, temperature 98 F (36.7 C), temperature source Oral, resp. rate 18, height 5\' 11"  (1.803 m), weight 107.911 kg (237 lb 14.4 oz), SpO2 97 %.  PHYSICAL EXAMINATION:  GENERAL:  82 y.o.-year-old patient lying in the bed with no acute distress.  EYES: Pupils equal, round, reactive to light and accommodation. No scleral icterus. Extraocular muscles intact.  HEENT: Head atraumatic, normocephalic. Oropharynx and nasopharynx clear.  NECK:  Supple, no jugular venous distention. No thyroid enlargement, no tenderness.  LUNGS: Moderate breath sounds bilaterally, no wheezing, positive rales,rhonchi or crepitation. No use of accessory muscles of respiration.  CARDIOVASCULAR: S1, S2 normal. Positive murmurs, rubs, or gallops.  ABDOMEN: Soft, nontender, nondistended. Bowel sounds present. No organomegaly or mass.   EXTREMITIES: No pedal edema, cyanosis, or clubbing.  NEUROLOGIC: Cranial nerves II through XII are intact. Muscle strength 5/5 in all extremities. Sensation intact. Gait not checked.  PSYCHIATRIC: The patient is alert and oriented x 3.  SKIN: No obvious rash, lesion, or ulcer.    LABORATORY PANEL:   CBC  Recent Labs Lab 07/29/15 0635  WBC 6.2  HGB 12.0*  HCT 35.7*  PLT 164   ------------------------------------------------------------------------------------------------------------------  Chemistries   Recent Labs Lab 07/29/15 0635  NA 134*  K 3.4*  CL 99*  CO2 25  GLUCOSE 80  BUN 60*  CREATININE 1.32*  CALCIUM 9.4   ------------------------------------------------------------------------------------------------------------------  Cardiac Enzymes  Recent Labs Lab 07/28/15 2251  TROPONINI 0.03   ------------------------------------------------------------------------------------------------------------------  RADIOLOGY:  Dg Chest 2 View  07/28/2015  CLINICAL DATA:  Shortness of breath and recent weight gain EXAM: CHEST  2 VIEW COMPARISON:  None. FINDINGS: There is left base atelectasis. The lungs elsewhere are clear. There is cardiomegaly with pulmonary vascular within normal limits. The aorta is diffusely tortuous and prominent with evidence suggesting aneurysmal dilatation. There is atherosclerotic calcification throughout the aorta. Patient is status post coronary artery bypass grafting. No adenopathy. There is degenerative change in the thoracic spine. IMPRESSION: Atelectasis left base.  Lungs otherwise clear. Generalized cardiomegaly. Suspect thoracic aortic aneurysm. Contrast enhanced chest CT would be the imaging study of choice for further evaluation and quantification of the thoracic aorta if further imaging is felt to be advisable at this time. Electronically Signed   By: Lowella Grip III M.D.   On: 07/28/2015 11:50    EKG:   Orders placed or  performed during the hospital encounter of 07/28/15  . ED EKG within 10 minutes  . ED  EKG within 10 minutes  . EKG 12-Lead  . 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  1. Acute on chronic combined systolic and diastolic heart failure: Probably from recent steroid use contributed to CHF and weight gain. Clinically improving with IV Lasix. Appreciate  [facility] recs Monitor input and output and daily weight Monitor BMP  2. Chronic atrial fibrillation: Continue digoxin, aspirin, metoprolol Patient is not a good candidate for Coumadin in view of aortic dissection, not a surgical candidate   3. Hyponatremia: This is due to CHF exacerbation. Monitor sodium wall and Lasix. Improving with Lasix sodium is at 134 today  4. Hyperkalemia: This is due to acute kidney injury from CHF exacerbation. Hold ACE inhibitor for now. Insulin, dextrose and Kayexalate for hyperkalemia. Potassium at 3.4 today  5. Essential hypertension: Continue metoprolol and monitor blood pressure.  6. BPH: Continue Tamsulosin.  6. Chronic diarrhea started on Entocort Follow-up with gastroenterology   All the records are reviewed and case discussed with Care Management/Social Workerr. Management plans discussed with the patient,  and they are in agreement.  CODE STATUS: DNR   TOTAL TIME TAKING CARE OF THIS PATIENT: 35  minutes.   POSSIBLE D/C IN 2 DAYS, DEPENDING ON CLINICAL CONDITION.   Nicholes Mango M.D on 07/29/2015 at 2:23 PM  Between 7am to 6pm - Pager - [phone] After 6pm go to [email or web address] - password [email or web address] [facility]  Office  [phone]  CC: Primary care physician; Viviana Simpler, MD

## 2015-07-29 NOTE — Consult Note (Signed)
GI Inpatient Consult Note  Reason for Consult: Chronic diarrhea   Attending Requesting Consult:  History of Present Illness: Lee Gutierrez is a 82 y.o. male with diarrhea since mid January. Initially blamed on noro virus. However, as diarrhea persisted, pt referred to Dr. Vira Agar for evaluation. Stool studies were all negative. Colonoscopy could not be done due to pt's overall heart/renal conditions. Pt finally started on entocort 9mg  daily 1 week ago for presumed colitis. With entocort, diarrhea has slowed down significantly. Unfortunately, appetite picked up significantly and CHF worsened. Last colonoscopy over 10 years ago. Last entocort yesterday AM. Entocort stopped by admitting doctor due to concern for CHF exacerbation from entocort. No rectal bleeding.  No abdominal pain or cramping. Has an f/u appt with Dr. Vira Agar this coming [place].  Past Medical History:  Past Medical History  Diagnosis Date  . Hypertension   . Arthritis   . Coronary artery disease   . Thoracoabdominal aortic aneurysm ([place]) 06/2009    large but stable aneurysm. 5 x 4.9, proximal descending thoracic aortic aorta measuring 5.8 x 5.1, the aortic 5.6 x 5.5 and distal thoracic aorta 3.9 x 4.1, His infrarenal aneurysm measured 3 x 2.7.  . Shortness of breath   . Aortic dissection ([place])   . Dysrhythmia     atrial fib  . Blood transfusion   . CHF, acute on chronic ([place]) 03/24/2011  . HOH (hard of hearing)     bilateral hearing aids  . Hx of echocardiogram 12/2011    EF 40% showed severe LA dilation, He had moderate concentric LHV. He has moderate inferior wall hypokinesis. There was evidence for pulmonary hypertension with an estimated RV systolic pressure at 62. He had moderate TR  . History of stress test 12/2011    Showed mild inferior thinning felt most likely due to attenuation artifact.  . Atrial fibrillation [facility])     Problem List: Patient Active Problem List   Diagnosis Date Noted  . Hyponatremia  07/28/2015  . Gastroenteritis 05/31/2015  . Injury of toe on right foot 05/23/2015  . Preventative health care 12/25/2014  . Chronic combined systolic and diastolic heart failure ([place]) 12/25/2014  . Advance directive discussed with patient 12/25/2014  . Acute combined systolic and diastolic heart failure ([place]) 10/21/2014  . Hyperlipidemia LDL goal <70 10/21/2014  . Hearing loss 07/05/2014  . Fecal incontinence 05/09/2013  . Aortic aneurysm, thoracic ([place]) 09/11/2011  . Pulmonary hypertension ([place]) 03/26/2011  . VENTRAL HERNIA 11/16/2009  . Coronary atherosclerosis 07/26/2009  . HYPERLIPIDEMIA 02/12/2009  . Essential hypertension 02/10/2008  . Atrial fibrillation ([place]) 02/10/2008  . Dissection of aorta ([place]) 02/10/2008  . BPH (benign prostatic hypertrophy) 02/10/2008  . Osteoarthritis of ankle 02/10/2008    Past Surgical History: Past Surgical History  Procedure Laterality Date  . Replacement total knee      bilaterally  . Joint replacement      Bilateral knee  . Tonsillectomy    . Hernia repair    . Cardiac catheterization    . Coronary artery bypass graft  Feb 2011  . Ankle fusion  04/22/2012    Procedure: ARTHRODESIS ANKLE;  Surgeon: Wylene Simmer, MD;  Location: [facility];  Service: Orthopedics;  Laterality: Left;  Left Ankle and Subtalar Arthrodesis     Allergies: No Known Allergies  Home Medications: Prescriptions prior to admission  Medication Sig Dispense Refill Last Dose  . amoxicillin (AMOXIL) 500 MG capsule TAKE 4 CAPSULES BY MOUTH 1 HOUR PRIOR TO DENTAL APPT  1 unknown  . aspirin 81 MG tablet Take 81 mg by mouth daily.   07/28/2015 at Unknown time  . budesonide (ENTOCORT EC) 3 MG 24 hr capsule Take 9 mg by mouth every morning.  0 07/28/2015 at Unknown time  . Cholecalciferol (VITAMIN D) 2000 UNITS tablet Take 2,000 Units by mouth daily.   07/28/2015 at Unknown time  . digoxin (LANOXIN) 0.125 MG tablet TAKE 1/2 TABLET (0.0625MG ) BY MOUTH ONCE DAILY 90 tablet 1 07/28/2015  at Unknown time  . enalapril (VASOTEC) 20 MG tablet Take 1 tablet (20 mg total) by mouth 2 (two) times daily. 180 tablet 3 07/28/2015 at Unknown time  . furosemide (LASIX) 40 MG tablet TAKE 80 MG (2 TABLETS) IN THE MORNING AND 80 MG (2 TABLETS) IN THE EARLY AFTERNOON. (Patient taking differently: TAKE 40mg  by mouth 2 times a day.) 360 tablet 5 07/28/2015 at Unknown time  . Melatonin 10 MG TABS Take 10 mg by mouth at bedtime.    07/27/2015 at Unknown time  . metolazone (ZAROXOLYN) 2.5 MG tablet Take 2.5 mg by mouth as directed. Take 1 by mouth once a week.   07/28/2015 at Unknown time  . metoprolol (LOPRESSOR) 50 MG tablet TAKE 1 TABLET (50 MG TOTAL) BY MOUTH 2 (TWO) TIMES DAILY. (Patient taking differently: No sig reported) 180 tablet 3 07/28/2015 at 0800  . Multiple Vitamin (MULTIVITAMIN WITH MINERALS) TABS Take 1 tablet by mouth daily. Centrum Silver   07/28/2015 at Unknown time  . potassium chloride (K-DUR) 10 MEQ tablet Take 20 mEq by mouth See admin instructions. Take 1 tablet by mouth 3 times a day, and take 1 tablet by mouth 4 times a day on the day you take Metolazone.   07/28/2015 at Unknown time  . Probiotic Product (ALIGN) 4 MG CAPS Take 4 mg by mouth every morning.   07/28/2015 at Unknown time  . silver sulfADIAZINE (SILVADENE) 1 % cream Apply 1 application topically daily. 50 g 0 Past Month at Unknown time  . simvastatin (ZOCOR) 20 MG tablet TAKE 1 TABLET BY MOUTH AT BEDTIME. 90 tablet 3 07/27/2015 at Unknown time  . tamsulosin (FLOMAX) 0.4 MG CAPS capsule TAKE 1 CAPSULE (0.4 MG TOTAL) BY MOUTH DAILY. 90 capsule 3 07/27/2015 at Unknown time  . traMADol (ULTRAM) 50 MG tablet TAKE 1 TABLET BY MOUTH 3 TIMES A DAY AS NEEDED 90 tablet 0 07/26/2015 at Unknown time   Home medication reconciliation was completed with the patient.   Scheduled Inpatient Medications:   . acidophilus  1 capsule Oral [id number]  . aspirin EC  81 mg Oral Daily  . budesonide  6 mg Oral Daily  . cholecalciferol  2,000 Units Oral  Daily  . digoxin  0.125 mg Oral Daily  . enoxaparin (LOVENOX) injection  40 mg Subcutaneous Q24H  . furosemide  40 mg Intravenous Q12H  . metoprolol  50 mg Oral BID  . multivitamin with minerals  1 tablet Oral Daily  . simvastatin  20 mg Oral QHS  . sodium chloride flush  3 mL Intravenous Q12H  . sodium chloride flush  3 mL Intravenous Q12H  . sodium polystyrene  30 g Oral Once  . tamsulosin  0.4 mg Oral QPC supper    Continuous Inpatient Infusions:     PRN Inpatient Medications:  sodium chloride, acetaminophen **OR** acetaminophen, alum & mag hydroxide-simeth, bisacodyl, HYDROcodone-acetaminophen, ondansetron **OR** ondansetron (ZOFRAN) IV, senna-docusate, sodium chloride flush  Family History: family history includes COPD in his brother; Hypertension in his  father. There is no history of Diabetes, Cancer, Heart attack, or Stroke.  The patient's family history is negative for inflammatory bowel disorders, GI malignancy, or solid organ transplantation.  Social History:   reports that he quit smoking about 52 years ago. He has never used smokeless tobacco. He reports that he does not drink alcohol or use illicit drugs. The patient denies ETOH, tobacco, or drug use.   Review of Systems: Constitutional: Weight is stable.  Eyes: No changes in vision. ENT: No oral lesions, sore throat.  GI: see HPI.  Heme/Lymph: No easy bruising.  CV: No chest pain.  GU: No hematuria.  Integumentary: No rashes.  Neuro: No headaches.  Psych: No depression/anxiety.  Endocrine: No heat/cold intolerance.  Allergic/Immunologic: No urticaria.  Resp: No cough, SOB.  Musculoskeletal: No joint swelling.    Physical Examination: BP 114/65 mmHg  Pulse 83  Temp(Src) 98 F (36.7 C) (Oral)  Resp 20  Ht 5\' 11"  (1.803 m)  Wt 107.911 kg (237 lb 14.4 oz)  BMI 33.20 kg/m2  SpO2 95% Gen: NAD, alert and oriented x 4 HEENT: PEERLA, EOMI, Neck: supple, no JVD or thyromegaly Chest: CTA bilaterally, no  wheezes, crackles, or other adventitious sounds CV: RRR, no m/g/c/r Abd: soft, NT, ND, +BS in all four quadrants; no HSM, guarding, ridigity, or rebound tenderness Ext: no edema, well perfused with 2+ pulses, Skin: no rash or lesions noted Lymph: no LAD  Data: Lab Results  Component Value Date   WBC 6.2 07/29/2015   HGB 12.0* 07/29/2015   HCT 35.7* 07/29/2015   MCV 99.1 07/29/2015   PLT 164 07/29/2015    Recent Labs Lab 07/28/15 1034 07/29/15 0635  HGB 11.7* 12.0*   Lab Results  Component Value Date   NA 134* 07/29/2015   K 3.4* 07/29/2015   CL 99* 07/29/2015   CO2 25 07/29/2015   BUN 60* 07/29/2015   CREATININE 1.32* 07/29/2015   Lab Results  Component Value Date   ALT 17 02/21/2015   AST 29 02/21/2015   ALKPHOS 130* 02/21/2015   BILITOT 1.0 02/21/2015   No results for input(s): APTT, INR, PTT in the last 168 hours. Assessment/Plan: Mr. Lauriano is a 82 y.o. male with chronic diarrhea. Improved on entocort but worsening CHF.   Recommendations: Will discuss case with Dr. Vira Agar. Ideally, pt should have a colonoscopy but his cormobid illnesses may prevent it. Would like to place pt back on entocort but at lower dose of 6mg  daily. Would like to taper it off quickly over the next week or two. Thank you for the consult. Please call with questions or concerns.  Meyah Corle, Lupita Dawn, MD

## 2015-07-30 ENCOUNTER — Encounter: Payer: Self-pay | Admitting: Student

## 2015-07-30 DIAGNOSIS — I5043 Acute on chronic combined systolic (congestive) and diastolic (congestive) heart failure: Secondary | ICD-10-CM

## 2015-07-30 MED ORDER — DIPHENHYDRAMINE HCL 25 MG PO CAPS
25.0000 mg | ORAL_CAPSULE | Freq: Every evening | ORAL | Status: DC | PRN
  Administered 2015-07-30 – 2015-07-31 (×2): 25 mg via ORAL
  Filled 2015-07-30 (×2): qty 1

## 2015-07-30 MED ORDER — POTASSIUM CHLORIDE CRYS ER 20 MEQ PO TBCR
40.0000 meq | EXTENDED_RELEASE_TABLET | Freq: Once | ORAL | Status: AC
  Administered 2015-07-30: 40 meq via ORAL
  Filled 2015-07-30: qty 2

## 2015-07-30 NOTE — Telephone Encounter (Signed)
Pt is presently at [facility] rm 256.

## 2015-07-30 NOTE — Progress Notes (Signed)
Hospital Problem List     Active Problems:   Chronic atrial fibrillation (HCC)   Hyponatremia   Acute on chronic combined systolic (congestive) and diastolic (congestive) heart failure [facility])    Patient Profile:   Primary Cardiologist:Dr. Claiborne Billings   82 y.o. male w/ PMH of combined systolic and diastolic CHF (EF 123456 by echo in 02/2015), permanent atrial fibrillation, CAD (s/p CABG in 2011), HTN, thoracoabdominal aortic aneurysm, and worsening diarrhea (started on steroids in the outpt setting) admitted to [facility] on 07/28/2015 for worsening shortness of breath due to acute CHF exacerbation.  Subjective   Says his breathing is "about the same". Denies any chest pain or palpitations. Reports improvement in his lower extremity edema. Noted he was getting an IV Lasix dose at midnight, but he says this is not disturbing his sleep.   Inpatient Medications    . acidophilus  1 capsule Oral BH-q7a  . aspirin EC  81 mg Oral Daily  . budesonide  6 mg Oral Daily  . cholecalciferol  2,000 Units Oral Daily  . digoxin  0.125 mg Oral Daily  . enoxaparin (LOVENOX) injection  40 mg Subcutaneous Q24H  . furosemide  40 mg Intravenous Q12H  . metoprolol  50 mg Oral BID  . multivitamin with minerals  1 tablet Oral Daily  . simvastatin  20 mg Oral QHS  . sodium chloride flush  3 mL Intravenous Q12H  . sodium chloride flush  3 mL Intravenous Q12H  . sodium polystyrene  30 g Oral Once  . tamsulosin  0.4 mg Oral QPC supper    Vital Signs    Filed Vitals:   07/29/15 2336 07/30/15 0438 07/30/15 0806 07/30/15 1200  BP: 118/68 138/66 121/64 122/75  Pulse: 86 68 84 70  Temp: 97.3 F (36.3 C) 97.5 F (36.4 C) 97.6 F (36.4 C)   TempSrc: Oral Oral Oral   Resp: 18 20 17 18   Height:      Weight:  235 lb 6.4 oz (106.777 kg)    SpO2: 93% 96% 92% 92%    Intake/Output Summary (Last 24 hours) at 07/30/15 1214 Last data filed at 07/30/15 1050  Gross per 24 hour  Intake    606 ml  Output   3600 ml    Net  -2994 ml   Filed Weights   07/28/15 1600 07/29/15 0444 07/30/15 0438  Weight: 243 lb 9.6 oz (110.496 kg) 237 lb 14.4 oz (107.911 kg) 235 lb 6.4 oz (106.777 kg)    Physical Exam    General: Pleasant elderly Caucasian male appearing in no acute distress. Head: Normocephalic, atraumatic.  Neck: Supple without bruits, JVD minimally elevated to 9cm. Lungs:  Resp regular and unlabored, rales at bases bilaterally. Heart: Irregularly, irregular, S1, S2, no S3, S4, 3/6 SEM at LLSB; no rub. Abdomen: Soft, non-tender, non-distended with normoactive bowel sounds. No hepatomegaly. No rebound/guarding. No obvious abdominal masses. Extremities: No clubbing or cyanosis. 1+ edema bilaterally up to mid-shins. Distal pedal pulses are 2+ bilaterally. Neuro: Alert and oriented X 3. Moves all extremities spontaneously. Psych: Normal affect.  Labs    CBC  Recent Labs  07/28/15 1034 07/29/15 0635  WBC 6.5 6.2  HGB 11.7* 12.0*  HCT 35.4* 35.7*  MCV 100.1* 99.1  PLT 172 [id number]   Basic Metabolic Panel  Recent Labs  07/28/15 1034 07/29/15 0635  NA 130* 134*  K 5.5* 3.4*  CL 100* 99*  CO2 23 25  GLUCOSE 104* 80  BUN 63* 60*  CREATININE 1.58* 1.32*  CALCIUM 9.0 9.4   Cardiac Enzymes  Recent Labs  07/28/15 1425 07/28/15 1652 07/28/15 2251  TROPONINI <0.03 <0.03 0.03   Thyroid Function Tests  Recent Labs  07/28/15 1652  TSH 1.771    Telemetry    Atrial fibrillation, rate-controlled in the 70's - 80's. Frequent PVC's.  ECG    No new tracings.   Cardiac Studies and Radiology    Dg Chest 2 View: 07/28/2015  CLINICAL DATA:  Shortness of breath and recent weight gain EXAM: CHEST  2 VIEW COMPARISON:  None. FINDINGS: There is left base atelectasis. The lungs elsewhere are clear. There is cardiomegaly with pulmonary vascular within normal limits. The aorta is diffusely tortuous and prominent with evidence suggesting aneurysmal dilatation. There is atherosclerotic calcification  throughout the aorta. Patient is status post coronary artery bypass grafting. No adenopathy. There is degenerative change in the thoracic spine. IMPRESSION: Atelectasis left base.  Lungs otherwise clear. Generalized cardiomegaly. Suspect thoracic aortic aneurysm. Contrast enhanced chest CT would be the imaging study of choice for further evaluation and quantification of the thoracic aorta if further imaging is felt to be advisable at this time. Electronically Signed   By: Lowella Grip III M.D.   On: 07/28/2015 11:50    Echocardiogram: 02/2015 Study Conclusions - Left ventricle: The cavity size was moderately dilated. Wall  thickness was increased in a pattern of mild LVH. Systolic  function was moderately reduced. The estimated ejection fraction  was in the range of 35% to 40%. Diffuse hypokinesis. The study is  not technically sufficient to allow evaluation of LV diastolic  function. - Ventricular septum: The contour showed diastolic flattening and  systolic flattening. - Aortic valve: Trileaflet. Sclerosis without stenosis. There was  trivial regurgitation. - Aorta: The aorta was mildly calcified. Aortic root dimension: 40  mm (ED). Ascending aortic diameter: 49 mm (S). Distal descending  thoracic aorta: 48.49 mm. - Ascending aorta: The ascending aorta is dilated. - Mitral valve: Mildly thickened leaflets . There was moderate  regurgitation. - Left atrium: Massively dilated at 120 ml/m2. - Right ventricle: The cavity size was moderately dilated. Systolic  function is reduced. - Right atrium: Severely dilated >50 cm2. - Tricuspid valve: There was severe regurgitation. - Pulmonary arteries: Dilated. PA peak pressure: 70 mm Hg (S). - Inferior vena cava: The vessel was dilated. The respirophasic  diameter changes were blunted (< 50%), consistent with elevated  central venous pressure. Reversal of flow in the hepatic veins is  noted.  Impressions: - LVEF 35-40%,  moderately dilated wtih mild eccentric wall  thickening, severe global hypokinesis, aortic aneurysm -  measuring up to 4.9 cm in the ascending aorta and 4.9 cm in the  proximal descending aorta, there is trivial AI, moderate (mostly  functional) MR, severe TR with RVSP 70 mmHG, dilated IVC, massive  biatrial enlargement, pressure overload of the RV is noted.  Assessment & Plan    1. Acute on chronic combined systolic and diastolic CHF - presented with worsening shorrness of breath and weight gain after starting outpatient steroids for diarrhea. Weight was up 20+ pounds.  - echo in 02/2015 showed an EF of 35-40% with severe global hypokinesis. - BNP elevated to 1000 at time of admission with CXR showing atelectasis of left base. - on IV Lasix 40mg  BID with net output of -8.0L thus far.   - weight down from 244 lbs to 235 lbs on 07/30/2015. Continue to obtain strict I&O's with daily weights.  -  will likely need at least 1-2 more additional days of IV diuresis before being switched back to PO.  2. Permanent atrial fibrillation - rate controlled. Continue Digoxin and BB - This patients CHA2DS2-VASc Score and unadjusted Ischemic Stroke Rate (% per year) is equal to 7.2 % stroke rate/year from a score of 5 (CHF, HTN, CAD, Age (2)). Not on anticoagulation secondary to his thoracic aneurysm. Continue ASA only.  3. CAD - s/p CABG in 2011. - denies any recent anginal symptoms. Troponin only minimally elevated this admission at 0.04. - continue ASA, statin, and BB.   4. HTN - remains stable. - consider addition of ACE-I following improvement of kidney function.  5. AKI - creatinine at 1.57 on admission.  - improved to 1.32 on 07/29/2015. Will order BMET for tomorrow.  6. HLD - continue statin  7. Type 3 Aortic Dissection - followed by Dr. Claiborne Billings and Dr. Cyndia Bent as an outpatient.   Signed, Erma Heritage , PA-C 12:14 PM 07/30/2015 Pager: [phone]

## 2015-07-30 NOTE — Consult Note (Signed)
Pt with CHF flare.  Doubt Entocort a factor as it is cleared rapidly after absorption and causes few steroid effects of mild severity.  Dropping dose to 2 per day will be a reasonable approach.  If he develops signif diarrhea again then I would go back to 3 per day.  Will follow with you.

## 2015-07-30 NOTE — Telephone Encounter (Signed)
PLEASE NOTE: All timestamps contained within this report are represented as [place] Standard Time. CONFIDENTIALTY NOTICE: This fax transmission is intended only for the addressee. It contains information that is legally privileged, confidential or otherwise protected from use or disclosure. If you are not the intended recipient, you are strictly prohibited from reviewing, disclosing, copying using or disseminating any of this information or taking any action in reliance on or regarding this information. If you have received this fax in error, please notify us immediately by telephone so that we can arrange for its return to [place]. Phone: [phone], Toll-Free: [phone], Fax: [phone] Page: 1 of 2 Call Id: [id number] [place] Patient Name: Lee Gutierrez Gender: Male DOB: Apr 24, 1931 Age: 82 Y 9 M 4 D Return Phone Number: [phone] (Primary) Address: City/State/Zip: [place] [address] Client [facility] - Client Client Site [place] - Day Physician Viviana Simpler Contact Type Call Who Is Calling Patient / Member / Family / Caregiver Call Type Triage / Clinical Caller Name [name] Nowrick Relationship To Patient Daughter Return Phone Number [phone] (Primary) Chief Complaint Weight Gain Reason for Call Symptomatic / Request for [place] says her father has congestive heart failure; being monitored with med, but has put on about 20 lb in the past week. His cardiologist is sending him to the ER today, and just wants to know which hospital Appointment Disposition EMR Appointment Not Necessary Info pasted into Epic No Translation No No Triage Reason Other Nurse Assessment Nurse: Vevelyn Royals, RN, Verdis Frederickson Date/Time (Eastern Time): 07/28/2015 9:45:33 AM Confirm and document reason for call. If  symptomatic, describe symptoms. You must click the next button to save text entered. ---Caller says her father has congestive heart failure; being monitored with med, but has put on about 20 lb in the past week. His cardiologist is sending him to the ER today, and just wants to know which hospital. Nurse spoke with caller, who states she has already spoken with Dr. Kathlen Mody, who recommends she take patient on to the ER now and she is taking patient to [facility] now. Declines triage or further assistance at this time. Has the patient traveled out of the country within the last 30 days? ---Not Applicable Does the patient have any new or worsening symptoms? ---Yes Will a triage be completed? ---No Select reason for no triage. ---Other Please document clinical information provided and list any resource used. ---Caller is taking patient to [facility] at this time. Declines triage. Guidelines Guideline Title Affirmed Question Affirmed Notes Nurse Date/Time (Eastern Time) PLEASE NOTE: All timestamps contained within this report are represented as [place] Standard Time. CONFIDENTIALTY NOTICE: This fax transmission is intended only for the addressee. It contains information that is legally privileged, confidential or otherwise protected from use or disclosure. If you are not the intended recipient, you are strictly prohibited from reviewing, disclosing, copying using or disseminating any of this information or taking any action in reliance on or regarding this information. If you have received this fax in error, please notify us immediately by telephone so that we can arrange for its return to [place]. Phone: [phone], Toll-Free: [phone], Fax: [phone] Page: 2 of 2 Call Id: [id number] [place]. Time Eilene Ghazi Time) Disposition Final User 07/28/2015 9:54:15 AM Clinical Call Yes Vevelyn Royals, RN, Verdis Frederickson

## 2015-07-30 NOTE — Progress Notes (Signed)
[place] [facility] [place] NAME: Lee Gutierrez    MR#:  [id number]  DATE OF BIRTH:  07-Jun-1930  SUBJECTIVE:  CHIEF COMPLAINT:  Patient denies any chest pain shortness of breath is better. Has chronic history of aortic dissection , not a surgical candidate .Good urine output as reported by patient's daughter at bedside On further asking- he claims drinking too much liquids daily, and was having diarrhea for long tome, now for few days diarrhea stopped and he have swelling on legs getting worse.  REVIEW OF SYSTEMS:  CONSTITUTIONAL: No fever, fatigue or weakness.  EYES: No blurred or double vision.  EARS, NOSE, AND THROAT: No tinnitus or ear pain.  RESPIRATORY: No cough, reports improved shortness of breath, denies wheezing or hemoptysis.  CARDIOVASCULAR: No chest pain, orthopnea, edema.  GASTROINTESTINAL: No nausea, vomiting, diarrhea or abdominal pain.  GENITOURINARY: No dysuria, hematuria.  ENDOCRINE: No polyuria, nocturia,  HEMATOLOGY: No anemia, easy bruising or bleeding SKIN: No rash or lesion. MUSCULOSKELETAL: No joint pain or arthritis.   NEUROLOGIC: No tingling, numbness, weakness.  PSYCHIATRY: No anxiety or depression.   DRUG ALLERGIES:  No Known Allergies  VITALS:  Blood pressure 110/62, pulse 71, temperature 97.9 F (36.6 C), temperature source Oral, resp. rate 20, height 5\' 11"  (1.803 m), weight 106.777 kg (235 lb 6.4 oz), SpO2 95 %.  PHYSICAL EXAMINATION:  GENERAL:  82 y.o.-year-old patient lying in the bed with no acute distress.  EYES: Pupils equal, round, reactive to light and accommodation. No scleral icterus. Extraocular muscles intact.  HEENT: Head atraumatic, normocephalic. Oropharynx and nasopharynx clear.  NECK:  Supple, no jugular venous distention. No thyroid enlargement, no tenderness.  LUNGS: Moderate breath sounds bilaterally, no wheezing, positive crepitation. No use of accessory muscles of respiration.   CARDIOVASCULAR: S1, S2 normal. Positive murmurs, rubs, or gallops.  ABDOMEN: Soft, nontender, nondistended. Bowel sounds present. No organomegaly or mass.  EXTREMITIES: + pedal edema, no cyanosis, or clubbing.  NEUROLOGIC: Cranial nerves II through XII are intact. Muscle strength 5/5 in all extremities. Sensation intact. Gait not checked.  PSYCHIATRIC: The patient is alert and oriented x 3.  SKIN: No obvious rash, lesion, or ulcer.    LABORATORY PANEL:   CBC  Recent Labs Lab 07/29/15 0635  WBC 6.2  HGB 12.0*  HCT 35.7*  PLT 164   ------------------------------------------------------------------------------------------------------------------  Chemistries   Recent Labs Lab 07/29/15 0635  NA 134*  K 3.4*  CL 99*  CO2 25  GLUCOSE 80  BUN 60*  CREATININE 1.32*  CALCIUM 9.4   ------------------------------------------------------------------------------------------------------------------  Cardiac Enzymes  Recent Labs Lab 07/28/15 2251  TROPONINI 0.03   ------------------------------------------------------------------------------------------------------------------  RADIOLOGY:  No results found.  EKG:   Orders placed or performed during the hospital encounter of 07/28/15  . ED EKG within 10 minutes  . ED EKG within 10 minutes  . EKG 12-Lead  . 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  1. Acute on chronic combined systolic and diastolic heart failure: Probably from recent steroid use contributed to CHF and weight gain.  Also was drinking too much liquids a day. Clinically improving with IV Lasix. Appreciate  [place] Cardi[facility] recs Monitor input and output and daily weight Monitor BMP   Still have some edema on legs.  2.  Chronic atrial  fibrillation: Continue digoxin, aspirin, metoprolol Patient is not a good candidate for Coumadin in view of aortic dissection, not a surgical candidate  3. Hyponatremia: This is due to CHF exacerbation. Monitor sodium while on Lasix. Improving with Lasix   4. Hyperkalemia: This is due to acute kidney injury from CHF exacerbation. Hold ACE inhibitor for now. Insulin, dextrose and Kayexalate for hyperkalemia. Potassium at 3.4  5. Essential hypertension: Continue metoprolol and monitor blood pressure.  6. BPH: Continue Tamsulosin.  6. Chronic diarrhea started on Entocort Follow-up with gastroenterology   All the records are reviewed and case discussed with Care Management/Social Workerr. Management plans discussed with the patient,  and they are in agreement.  CODE STATUS: DNR   TOTAL TIME TAKING CARE OF THIS PATIENT: 35  minutes.   POSSIBLE D/C IN 2 DAYS, DEPENDING ON CLINICAL CONDITION.   Vaughan Basta M.D on 07/30/2015 at 9:19 PM  Between 7am to 6pm - Pager - [phone] After 6pm go to [email or web address] - password [email or web address]  Office  [phone]  CC: Primary care physician; Viviana Simpler, MD

## 2015-07-30 NOTE — Care Management (Signed)
Patient admitted with exacerbation of CHF most likely due to steroids he was prescribed for diarrhea.  Patient has history of heart failure and up until now symtpoms have been managed very well.  He does not have frequent admission or presentations to hospital.  This would indicate that patient has indeed been able to manage his medical condition well in the home setting.  At present, have not identified any needs other than may benefit from heart failure clinic.  At present is not requiring 02.  Discussed during progression of need to obtain exertional room air sats.

## 2015-07-30 NOTE — Telephone Encounter (Signed)
acknowledged

## 2015-07-30 NOTE — Telephone Encounter (Signed)
Reportedly has CHF exacerbation---I will check on him there

## 2015-07-30 NOTE — Care Management Important Message (Signed)
Important Message  Patient Details  Name: Lee Gutierrez MRN: [id number] Date of Birth: 14-Mar-1931   Medicare Important Message Given:  Yes    [name] 07/30/2015, 1:52 PM

## 2015-07-30 NOTE — Plan of Care (Signed)
Problem: Cardiac: Goal: Ability to achieve and maintain adequate cardiopulmonary perfusion will improve Outcome: Progressing Daily weights  Problem: Pain Managment: Goal: General experience of comfort will improve Outcome: Progressing Prn medications  Problem: Tissue Perfusion: Goal: Risk factors for ineffective tissue perfusion will decrease Outcome: Progressing SQ Lovenox  Problem: Activity: Goal: Risk for activity intolerance will decrease Outcome: Progressing Physical therapy working with pt

## 2015-07-31 DIAGNOSIS — I2510 Atherosclerotic heart disease of native coronary artery without angina pectoris: Secondary | ICD-10-CM

## 2015-07-31 LAB — BASIC METABOLIC PANEL
Anion gap: 5 (ref 5–15)
BUN: 39 mg/dL — AB (ref 6–20)
CALCIUM: 8.8 mg/dL — AB (ref 8.9–10.3)
CO2: 35 mmol/L — ABNORMAL HIGH (ref 22–32)
CREATININE: 0.87 mg/dL (ref 0.61–1.24)
Chloride: 92 mmol/L — ABNORMAL LOW (ref 101–111)
GFR calc Af Amer: >60 mL/min (ref >60)
Glucose, Bld: 101 mg/dL — ABNORMAL HIGH (ref 65–99)
POTASSIUM: 3 mmol/L — AB (ref 3.5–5.1)
SODIUM: 132 mmol/L — AB (ref 135–145)

## 2015-07-31 LAB — MAGNESIUM: MAGNESIUM: 1.8 mg/dL (ref 1.7–2.4)

## 2015-07-31 MED ORDER — POTASSIUM CHLORIDE 20 MEQ PO PACK
40.0000 meq | PACK | Freq: Once | ORAL | Status: AC
  Administered 2015-07-31: 40 meq via ORAL
  Filled 2015-07-31: qty 2

## 2015-07-31 MED ORDER — POTASSIUM CHLORIDE CRYS ER 20 MEQ PO TBCR
40.0000 meq | EXTENDED_RELEASE_TABLET | Freq: Once | ORAL | Status: AC
  Administered 2015-07-31: 40 meq via ORAL
  Filled 2015-07-31: qty 2

## 2015-07-31 NOTE — Progress Notes (Signed)
Pt had 9 beat run of V tach, asymptomatic, resting comfortably. MD Dr. Melynda Ripple notified, no new orders received. RN will continue to monitor. Rachael Fee, RN

## 2015-07-31 NOTE — Progress Notes (Signed)
Pt's potassium 3 this morning. MD Dr. Estanislado Pandy notified. Orders received for 31meq potassium PO. RN will administer and continue to monitor. Rachael Fee, RN

## 2015-07-31 NOTE — Progress Notes (Addendum)
Patient transferred to 259 to be closer to nurses station. Son, [place], at bedside and aware.

## 2015-07-31 NOTE — Plan of Care (Signed)
Problem: Physical Regulation: Goal: Ability to maintain clinical measurements within normal limits will improve Outcome: Progressing Patient OOB to chair and bathroom with aide and RN several times today.

## 2015-07-31 NOTE — Progress Notes (Signed)
Patient: Lee Gutierrez / Admit Date: 07/28/2015 / Date of Encounter: 07/31/2015, 9:10 AM   Subjective: Breathing better. Minus 8.9L for the admission. Renal function is improved today at 0.87 from 1.32 the day prior. Remains on IV Lasix 40 mg bid. This is disturbing his sleep. No chest pain.   Review of Systems: Review of Systems  Constitutional: Positive for weight loss and malaise/fatigue. Negative for fever, chills and diaphoresis.  HENT: Negative for congestion.   Eyes: Negative for discharge and redness.  Respiratory: Positive for shortness of breath. Negative for cough, sputum production and wheezing.   Cardiovascular: Positive for leg swelling. Negative for chest pain, palpitations, orthopnea, claudication and PND.  Gastrointestinal: Negative for nausea, vomiting and abdominal pain.  Musculoskeletal: Negative for falls.  Skin: Negative for rash.  Neurological: Positive for weakness. Negative for dizziness and loss of consciousness.  Endo/Heme/Allergies: Does not bruise/bleed easily.  Psychiatric/Behavioral: The patient is not nervous/anxious.     Objective: Telemetry: NSR, 2 episodes of NSVT at 5 and 9 beats Physical Exam: Blood pressure 138/88, pulse 64, temperature 97.9 F (36.6 C), temperature source Oral, resp. rate 21, height 5\' 11"  (1.803 m), weight 228 lb 3.2 oz (103.511 kg), SpO2 94 %. Body mass index is 31.84 kg/(m^2). General: Well developed, well nourished, in no acute distress. Head: Normocephalic, atraumatic, sclera non-icteric, no xanthomas, nares are without discharge. Neck: Negative for carotid bruits. JVP not elevated. Lungs: Clear bilaterally to auscultation without wheezes, rales, or rhonchi. Breathing is unlabored. Heart: RRR S1 S2 II/VI systolic murmurs, rubs, or gallops.  Abdomen: Obese, soft, non-tender, non-distended with normoactive bowel sounds. No rebound/guarding. Extremities: No clubbing or cyanosis. No edema. Distal pedal pulses are 2+ and  equal bilaterally. Neuro: Alert and oriented X 3. Moves all extremities spontaneously. Psych:  Responds to questions appropriately with a normal affect.   Intake/Output Summary (Last 24 hours) at 07/31/15 0910 Last data filed at 07/31/15 0813  Gross per 24 hour  Intake    843 ml  Output   1500 ml  Net   -657 ml    Inpatient Medications:  . acidophilus  1 capsule Oral BH-q7a  . aspirin EC  81 mg Oral Daily  . budesonide  6 mg Oral Daily  . cholecalciferol  2,000 Units Oral Daily  . digoxin  0.125 mg Oral Daily  . enoxaparin (LOVENOX) injection  40 mg Subcutaneous Q24H  . furosemide  40 mg Intravenous Q12H  . metoprolol  50 mg Oral BID  . multivitamin with minerals  1 tablet Oral Daily  . simvastatin  20 mg Oral QHS  . sodium chloride flush  3 mL Intravenous Q12H  . sodium chloride flush  3 mL Intravenous Q12H  . sodium polystyrene  30 g Oral Once  . tamsulosin  0.4 mg Oral QPC supper   Infusions:    Labs:  Recent Labs  07/29/15 0635 07/31/15 0410  NA 134* 132*  K 3.4* 3.0*  CL 99* 92*  CO2 25 35*  GLUCOSE 80 101*  BUN 60* 39*  CREATININE 1.32* 0.87  CALCIUM 9.4 8.8*   No results for input(s): AST, ALT, ALKPHOS, BILITOT, PROT, ALBUMIN in the last 72 hours.  Recent Labs  07/28/15 1034 07/29/15 0635  WBC 6.5 6.2  HGB 11.7* 12.0*  HCT 35.4* 35.7*  MCV 100.1* 99.1  PLT 172 164    Recent Labs  07/28/15 1034 07/28/15 1425 07/28/15 1652 07/28/15 2251  TROPONINI 0.04* <0.03 <0.03 0.03   Invalid  input(s): POCBNP No results for input(s): HGBA1C in the last 72 hours.   Weights: Filed Weights   07/29/15 0444 07/30/15 0438 07/31/15 0500  Weight: 237 lb 14.4 oz (107.911 kg) 235 lb 6.4 oz (106.777 kg) 228 lb 3.2 oz (103.511 kg)     Radiology/Studies:  Dg Chest 2 View  07/28/2015  CLINICAL DATA:  Shortness of breath and recent weight gain EXAM: CHEST  2 VIEW COMPARISON:  None. FINDINGS: There is left base atelectasis. The lungs elsewhere are clear. There  is cardiomegaly with pulmonary vascular within normal limits. The aorta is diffusely tortuous and prominent with evidence suggesting aneurysmal dilatation. There is atherosclerotic calcification throughout the aorta. Patient is status post coronary artery bypass grafting. No adenopathy. There is degenerative change in the thoracic spine. IMPRESSION: Atelectasis left base.  Lungs otherwise clear. Generalized cardiomegaly. Suspect thoracic aortic aneurysm. Contrast enhanced chest CT would be the imaging study of choice for further evaluation and quantification of the thoracic aorta if further imaging is felt to be advisable at this time. Electronically Signed   By: Lowella Grip III M.D.   On: 07/28/2015 11:50     Assessment and Plan   1. Acute on chronic combined CHF: -Improving -Continue IV Lasix at this time given improved renal function -Continue metoprolol   2. Chronic Afib: -Currently rate controlled -Continue metoprolol and digoxin -Not a long term full dose anticoagulation candidate given his history of type 3 aortic dissection as below  3. CAD s/p CABG 2011: -Stable -No current symptoms of angina -Continue aspirin, simvastatin, and metoprolol   4. HTN: -Well controlled -Continue current treatment  4. Acute renal injury: -Improved today -Continue to monitor throughout diuresis -Given improved renal function able to continue digoxin at this time with close monitoring   5. HLD: -Simvastatin   6. Type 3 aortic dissection: -Followed by Drs Claiborne Billings and Cyndia Bent as an outpatient  -Optimal BP and lipid control is a must  7. Hypokalemia: -Replete to 4.0  8. NSVT: -Metoprolol as above -Magnesium is pending -Asymptomatic    Signed, Christell Faith, PA-C Pager: [phone] 07/31/2015, 9:10 AM    Attending Note Patient seen and examined, agree with detailed note above,  Patient presentation and plan discussed on rounds.   Son at the bedside, Dramatic improvement in  his fluid status, 9 L negative on IV Lasix Still with mild shortness of breath with talking while in bed Lab work reviewed with patient and son, improved renal function, low potassium  Review of telemetry shows atrial fibrillation, heart rate well controlled Blood pressure well controlled on review of vitals Clinical exam with poor inspiration at rest, soft abdomen, heart rate with irregular rhythm, No leg edema  -Given shortness of breath even at rest in bed on talking, improved renal function, consider 1 more day of Lasix IV with close monitoring of renal function --Given him additional potassium powder   Signed: Esmond Plants  M.D., Ph.D. [facility]

## 2015-07-31 NOTE — Progress Notes (Addendum)
Per CCMD, pt had 5-bt run of vtach. Dr. Molli Hazard paged and aware, orders to add on serum mg. Cardiology also aware. Will continue to monitor.

## 2015-07-31 NOTE — Consult Note (Signed)
Patient without diarrhea, on 6mg  Entocort at this time.  Discussed with daughter and will recommend go home on that dose and after one month taper to 3mg  and do that for 2 months then discontinue.  IF flare at any time let us know.  I will sign off.

## 2015-08-01 ENCOUNTER — Telehealth: Payer: Self-pay | Admitting: Cardiovascular Disease

## 2015-08-01 DIAGNOSIS — E871 Hypo-osmolality and hyponatremia: Secondary | ICD-10-CM

## 2015-08-01 LAB — BASIC METABOLIC PANEL
Anion gap: 9 (ref 5–15)
BUN: 30 mg/dL — AB (ref 6–20)
CALCIUM: 8.8 mg/dL — AB (ref 8.9–10.3)
CO2: 29 mmol/L (ref 22–32)
Chloride: 92 mmol/L — ABNORMAL LOW (ref 101–111)
Creatinine, Ser: 0.88 mg/dL (ref 0.61–1.24)
GFR calc Af Amer: >60 mL/min (ref >60)
GLUCOSE: 103 mg/dL — AB (ref 65–99)
Potassium: 3.5 mmol/L (ref 3.5–5.1)
Sodium: 130 mmol/L — ABNORMAL LOW (ref 135–145)

## 2015-08-01 MED ORDER — BUDESONIDE 3 MG PO CPEP: 6.0000 mg | ORAL_CAPSULE | Freq: Every day | ORAL | Status: AC

## 2015-08-01 MED ORDER — MAGNESIUM OXIDE 400 (241.3 MG) MG PO TABS
400.0000 mg | ORAL_TABLET | Freq: Every day | ORAL | Status: DC
  Administered 2015-08-01: 400 mg via ORAL
  Filled 2015-08-01: qty 1

## 2015-08-01 MED ORDER — FUROSEMIDE 40 MG PO TABS
40.0000 mg | ORAL_TABLET | Freq: Two times a day (BID) | ORAL | Status: DC
  Administered 2015-08-01: 40 mg via ORAL
  Filled 2015-08-01: qty 1

## 2015-08-01 MED ORDER — MAGNESIUM OXIDE 400 (241.3 MG) MG PO TABS: 400.0000 mg | ORAL_TABLET | Freq: Every day | ORAL | Status: DC

## 2015-08-01 MED ORDER — FUROSEMIDE 40 MG PO TABS: 40.0000 mg | ORAL_TABLET | Freq: Two times a day (BID) | ORAL | Status: DC

## 2015-08-01 MED ORDER — POTASSIUM CHLORIDE 20 MEQ PO PACK
20.0000 meq | PACK | Freq: Every day | ORAL | Status: DC
Start: 2015-08-01 — End: 2015-10-23

## 2015-08-01 NOTE — Discharge Instructions (Signed)
Fluid restriction up to 1500 ml daily Low salt diet. Daily weigh your self, if > 2 Lb gain in a day or > 5 Lb in 1 week- take lasix 40 mg 3 times a day for 2 days- and still not able to get rid of extra weight- call your doctor or cardiologist.

## 2015-08-01 NOTE — Care Management (Signed)
has been transitioned to oral lasix.  Independent and room is not qualifying for home 02.   Appointment has been made at the heart failure clinic

## 2015-08-01 NOTE — Telephone Encounter (Signed)
Patient needs 1 wk fu from 08-01-15 d/c [facility] seen for chf    Former [place] patient wants no one else but [facility]  Patient schedule for 09-21-15 at 9:40 am. Added to waitlist.  Let hospital know that we will call patient at home with hopefully sooner appt.  Is there anywhere else we can add them in ?

## 2015-08-01 NOTE — Progress Notes (Signed)
[place] [facility] [place] NAME: Lee Gutierrez    MR#:  [id number]  DATE OF BIRTH:  1931-04-15  SUBJECTIVE:  CHIEF COMPLAINT:  Patient denies any chest pain shortness of breath is better. Has chronic history of aortic dissection , not a surgical candidate .Good urine output as reported by patient's daughter at bedside On further asking- he claims drinking too much liquids daily, and was having diarrhea for long tome, now for few days diarrhea stopped and he have swelling on legs getting worse. Has very good diuresis with IV lasix, and he feels better, troubled due to frequent urination though. Renal function improved.  REVIEW OF SYSTEMS:  CONSTITUTIONAL: No fever, fatigue or weakness.  EYES: No blurred or double vision.  EARS, NOSE, AND THROAT: No tinnitus or ear pain.  RESPIRATORY: No cough, reports improved shortness of breath, denies wheezing or hemoptysis.  CARDIOVASCULAR: No chest pain, orthopnea, edema.  GASTROINTESTINAL: No nausea, vomiting, diarrhea or abdominal pain.  GENITOURINARY: No dysuria, hematuria.  ENDOCRINE: No polyuria, nocturia,  HEMATOLOGY: No anemia, easy bruising or bleeding SKIN: No rash or lesion. MUSCULOSKELETAL: No joint pain or arthritis.   NEUROLOGIC: No tingling, numbness, weakness.  PSYCHIATRY: No anxiety or depression.   DRUG ALLERGIES:  No Known Allergies  VITALS:  Blood pressure 123/74, pulse 72, temperature 98.1 F (36.7 C), temperature source Oral, resp. rate 20, height 5\' 11"  (1.803 m), weight 103.738 kg (228 lb 11.2 oz), SpO2 93 %.  PHYSICAL EXAMINATION:  GENERAL:  82 y.o.-year-old patient lying in the bed with no acute distress.  EYES: Pupils equal, round, reactive to light and accommodation. No scleral icterus. Extraocular muscles intact.  HEENT: Head atraumatic, normocephalic. Oropharynx and nasopharynx clear.  NECK:  Supple, no jugular venous distention. No thyroid enlargement, no tenderness.   LUNGS: Moderate breath sounds bilaterally, no wheezing, positive crepitation. No use of accessory muscles of respiration.  CARDIOVASCULAR: S1, S2 normal. Positive murmurs, rubs, or gallops.  ABDOMEN: Soft, nontender, nondistended. Bowel sounds present. No organomegaly or mass.  EXTREMITIES: + pedal edema, no cyanosis, or clubbing.  NEUROLOGIC: Cranial nerves II through XII are intact. Muscle strength 5/5 in all extremities. Sensation intact. Gait not checked.  PSYCHIATRIC: The patient is alert and oriented x 3.  SKIN: No obvious rash, lesion, or ulcer.    LABORATORY PANEL:   CBC  Recent Labs Lab 07/29/15 0635  WBC 6.2  HGB 12.0*  HCT 35.7*  PLT 164   ------------------------------------------------------------------------------------------------------------------  Chemistries   Recent Labs Lab 07/31/15 0410 08/01/15 0624  NA 132* 130*  K 3.0* 3.5  CL 92* 92*  CO2 35* 29  GLUCOSE 101* 103*  BUN 39* 30*  CREATININE 0.87 0.88  CALCIUM 8.8* 8.8*  MG 1.8  --    ------------------------------------------------------------------------------------------------------------------  Cardiac Enzymes  Recent Labs Lab 07/28/15 2251  TROPONINI 0.03   ------------------------------------------------------------------------------------------------------------------  RADIOLOGY:  No results found.  EKG:   Orders placed or performed during the hospital encounter of 07/28/15  . ED EKG within 10 minutes  . ED EKG within 10 minutes  . EKG 12-Lead  . 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  1. Acute on chronic combined systolic and diastolic heart failure: Probably from recent steroid use contributed to CHF and weight gain.  Also was  drinking too much liquids a  day. Clinically improving with IV Lasix. Appreciate  [facility] recs Monitor input and output and daily weight Monitor BMP   Still have some edema on legs. Cardio suggesting to continue IV lasix one more day.  2. Chronic atrial fibrillation: Continue digoxin, aspirin, metoprolol Patient is not a good candidate for Coumadin in view of aortic dissection, not a surgical candidate  Rate is controlled.  3. Hyponatremia: This is due to CHF exacerbation. Monitor sodium while on Lasix. Have some drop, will switch to oral lasix soon.  4. Hyperkalemia: This is due to acute kidney injury from CHF exacerbation. Hold ACE inhibitor for now. Insulin, dextrose and Kayexalate for hyperkalemia. Potassium low again, replace oral.  5. Essential hypertension: Continue metoprolol and monitor blood pressure.   Under control.  6. BPH: Continue Tamsulosin.  6. Chronic diarrhea started on Entocort Follow-up with gastroenterology   All the records are reviewed and case discussed with Care Management/Social Workerr. Management plans discussed with the patient,  and they are in agreement.  CODE STATUS: DNR   TOTAL TIME TAKING CARE OF THIS PATIENT: 35  minutes.   POSSIBLE D/C IN 2 DAYS, DEPENDING ON CLINICAL CONDITION.   Vaughan Basta M.D on 08/01/2015 at 7:50 AM  Between 7am to 6pm - Pager - [phone] After 6pm go to [email or web address] - password [email or web address] [facility]  Office  [phone]  CC: Primary care physician; Viviana Simpler, MD

## 2015-08-01 NOTE — Care Management Important Message (Signed)
Important Message  Patient Details  Name: Lee Gutierrez MRN: [id number] Date of Birth: 1930-11-24   Medicare Important Message Given:  Yes    Juliann Pulse A Jameeka Marcy 08/01/2015, 11:50 AM

## 2015-08-01 NOTE — Progress Notes (Signed)
Patient: Lee Gutierrez / Admit Date: 07/28/2015 / Date of Encounter: 08/01/2015, 7:32 AM   Subjective: Feeling better and stronger. Improved SOB. No chest pain or further diarrhea. Has had daily BM. Minus 10.4L for the admission to date and 1.6L for the past 24 hours. Renal function stable. Sodium trending down slightly.   Review of Systems: Review of Systems  Constitutional: Positive for weight loss and malaise/fatigue. Negative for fever, chills and diaphoresis.  HENT: Negative for congestion.   Eyes: Negative for discharge and redness.  Respiratory: Positive for shortness of breath. Negative for cough, hemoptysis, sputum production and wheezing.        Improved SOB  Cardiovascular: Negative for chest pain, palpitations, orthopnea, claudication, leg swelling and PND.  Gastrointestinal: Negative for nausea, vomiting, abdominal pain and diarrhea.  Musculoskeletal: Negative for myalgias and falls.  Skin: Negative for rash.  Neurological: Negative for dizziness, loss of consciousness and weakness.  Endo/Heme/Allergies: Does not bruise/bleed easily.  Psychiatric/Behavioral: The patient is not nervous/anxious.      Objective: Telemetry: sinus rhythm Physical Exam: Blood pressure 123/74, pulse 72, temperature 98.1 F (36.7 C), temperature source Oral, resp. rate 20, height 5\' 11"  (1.803 m), weight 228 lb 11.2 oz (103.738 kg), SpO2 93 %. Body mass index is 31.91 kg/(m^2). General: Well developed, well nourished, in no acute distress. Head: Normocephalic, atraumatic, sclera non-icteric, no xanthomas, nares are without discharge. Neck: Negative for carotid bruits. JVP not elevated. Lungs: Clear bilaterally to auscultation without wheezes, rales, or rhonchi. Breathing is unlabored. Heart: RRR S1 S2, II/VI systolic murmurs, no rubs, or gallops.  Abdomen: Obese, soft, non-tender, non-distended with normoactive bowel sounds. No rebound/guarding. Extremities: No clubbing or cyanosis. No  edema. Distal pedal pulses are 2+ and equal bilaterally. Neuro: Alert and oriented X 3. Moves all extremities spontaneously. Psych:  Responds to questions appropriately with a normal affect.   Intake/Output Summary (Last 24 hours) at 08/01/15 0732 Last data filed at 08/01/15 0546  Gross per 24 hour  Intake    240 ml  Output   1850 ml  Net  -1610 ml    Inpatient Medications:  . acidophilus  1 capsule Oral BH-q7a  . aspirin EC  81 mg Oral Daily  . budesonide  6 mg Oral Daily  . cholecalciferol  2,000 Units Oral Daily  . digoxin  0.125 mg Oral Daily  . enoxaparin (LOVENOX) injection  40 mg Subcutaneous Q24H  . furosemide  40 mg Intravenous Q12H  . metoprolol  50 mg Oral BID  . multivitamin with minerals  1 tablet Oral Daily  . simvastatin  20 mg Oral QHS  . sodium chloride flush  3 mL Intravenous Q12H  . sodium chloride flush  3 mL Intravenous Q12H  . sodium polystyrene  30 g Oral Once  . tamsulosin  0.4 mg Oral QPC supper   Infusions:    Labs:  Recent Labs  07/31/15 0410 08/01/15 0624  NA 132* 130*  K 3.0* 3.5  CL 92* 92*  CO2 35* 29  GLUCOSE 101* 103*  BUN 39* 30*  CREATININE 0.87 0.88  CALCIUM 8.8* 8.8*  MG 1.8  --    No results for input(s): AST, ALT, ALKPHOS, BILITOT, PROT, ALBUMIN in the last 72 hours. No results for input(s): WBC, NEUTROABS, HGB, HCT, MCV, PLT in the last 72 hours. No results for input(s): CKTOTAL, CKMB, TROPONINI in the last 72 hours. Invalid input(s): POCBNP No results for input(s): HGBA1C in the last 72 hours.  Weights: Filed Weights   07/30/15 0438 07/31/15 0500 08/01/15 0407  Weight: 235 lb 6.4 oz (106.777 kg) 228 lb 3.2 oz (103.511 kg) 228 lb 11.2 oz (103.738 kg)     Radiology/Studies:  Dg Chest 2 View  07/28/2015  CLINICAL DATA:  Shortness of breath and recent weight gain EXAM: CHEST  2 VIEW COMPARISON:  None. FINDINGS: There is left base atelectasis. The lungs elsewhere are clear. There is cardiomegaly with pulmonary vascular  within normal limits. The aorta is diffusely tortuous and prominent with evidence suggesting aneurysmal dilatation. There is atherosclerotic calcification throughout the aorta. Patient is status post coronary artery bypass grafting. No adenopathy. There is degenerative change in the thoracic spine. IMPRESSION: Atelectasis left base.  Lungs otherwise clear. Generalized cardiomegaly. Suspect thoracic aortic aneurysm. Contrast enhanced chest CT would be the imaging study of choice for further evaluation and quantification of the thoracic aorta if further imaging is felt to be advisable at this time. Electronically Signed   By: Lowella Grip III M.D.   On: 07/28/2015 11:50     Assessment and Plan   1. Acute on chronic combined CHF: -Improving -Transition to PO Lasix today at 40 mg q 12 hours with KCl repletion  -Continue metoprolol   2. Chronic Afib: -Currently rate controlled -Continue metoprolol and digoxin -Not a long term full dose anticoagulation candidate given his history of type 3 aortic dissection as below  3. CAD s/p CABG 2011: -Stable -No current symptoms of angina -Continue aspirin, simvastatin, and metoprolol   4. HTN: -Well controlled -Continue current treatment  4. Acute renal injury: -Improved today -Continue to monitor throughout diuresis -Given improved renal function able to continue digoxin at this time with close monitoring   5. HLD: -Simvastatin   6. Type 3 aortic dissection: -Followed by Drs Claiborne Billings and Cyndia Bent as an outpatient  -Optimal BP and lipid control is a must  7. Hypokalemia: -Replete to 4.0  8. NSVT/PVCs: -Improved -Metoprolol as above -Magnesium 1.8, will add mag ox 400 mg daily -Asymptomatic    Signed, Christell Faith, PA-C Pager: [phone] 08/01/2015, 7:32 AM

## 2015-08-02 ENCOUNTER — Encounter: Payer: Self-pay | Admitting: Cardiovascular Disease

## 2015-08-03 ENCOUNTER — Encounter: Payer: Self-pay | Admitting: Cardiovascular Disease

## 2015-08-03 ENCOUNTER — Encounter: Payer: Medicare Other | Admitting: Physician Assistant

## 2015-08-03 ENCOUNTER — Other Ambulatory Visit: Payer: Medicare Other

## 2015-08-03 ENCOUNTER — Ambulatory Visit (INDEPENDENT_AMBULATORY_CARE_PROVIDER_SITE_OTHER): Payer: Medicare Other | Admitting: Cardiovascular Disease

## 2015-08-03 VITALS — BP 104/60 | HR 66 | Ht 70.0 in | Wt 242.5 lb

## 2015-08-03 DIAGNOSIS — E785 Hyperlipidemia, unspecified: Secondary | ICD-10-CM

## 2015-08-03 DIAGNOSIS — I712 Thoracic aortic aneurysm, without rupture, unspecified: Secondary | ICD-10-CM

## 2015-08-03 DIAGNOSIS — I5042 Chronic combined systolic (congestive) and diastolic (congestive) heart failure: Secondary | ICD-10-CM

## 2015-08-03 DIAGNOSIS — I482 Chronic atrial fibrillation, unspecified: Secondary | ICD-10-CM

## 2015-08-03 NOTE — Assessment & Plan Note (Signed)
Monitor by CT surgery in [facility]

## 2015-08-03 NOTE — Assessment & Plan Note (Signed)
Currently not on anticoagulation for chronic atrial fibrillation secondary to dissection This was discussed with patient in the hospital and documented in previous notes

## 2015-08-03 NOTE — Assessment & Plan Note (Signed)
Cholesterol is at goal on the current lipid regimen. No changes to the medications were made.  

## 2015-08-03 NOTE — Telephone Encounter (Signed)
Pt daughter calling now stating pt was released Wednesday from hospital and is a bit concerned for patient.  She states he had a weight gain 4 pounds since last night Pt is having trouble urinating and trouble sleeping Has a lot of swelling in lower abdomen Pt is on wait list.  Please advise.

## 2015-08-03 NOTE — Telephone Encounter (Signed)
Dr. Rockey Situ had a cancellation today.  Front desk calling pt to see if he can come in.

## 2015-08-03 NOTE — Assessment & Plan Note (Signed)
Goal weight around 225 pounds Currently weight continues to run in the low 230 range Recommended he take Lasix 40 mg twice a day on a regular basis with potassium daily Suggested he take metolazone for weight greater than 230 pounds in the morning prior to a.m. Lasix Would take extra potassium when he takes metolazone

## 2015-08-03 NOTE — Progress Notes (Signed)
Patient ID: Lee Gutierrez, male    DOB: 13-Jul-1930, 81 y.o.   MRN: [id number]  HPI Comments: Lee Gutierrez is a 82 y.o. male with a known history of chronic systolic and diastolic heart failure with ejection fraction of 40%, chronic atrial fibrillation, thoracic aortic aneurysm, essential hypertension who presented to [facility] 07/28/2015 with shortness of breath, weight gain, leg edema. Cardiology was consult in for acute on chronic diastolic and systolic CHF. He presents today to establish care in the clinic after recent discharge from the hospital 2 days ago.  Over the course of his hospital stay, he had aggressive diuresis with 10 L net negative Hospital records reviewed He had dramatic improvement in his abdominal swelling, leg swelling He was evaluated for chronic diarrhea by GI, Dr. Vira Agar. Continued on entocort 6 mg daily. He did have improvement of his diarrhea.  Diuretics have been held prior to admission by family given his diarrhea possibly resulting in fluid overload Baseline weight seems to be around 225 pounds On discharge from the hospital, weight was 234 pounds . Since then weight has been 232 up to 236 pounds today. Sitting with his legs down at home, now has pitting edema up to his knees he denies significant shortness of breath, but is still very weak from long hospital course  He is been taking Lasix 40 mg twice a day at home, was instructed to take metolazone as needed for fluid retention   EKG on today's visit shows atrial fibrillation with rate 66 bpm, intraventricular conduction delay, left anterior fascicular block     No Known Allergies  Current Outpatient Prescriptions on File Prior to Visit  Medication Sig Dispense Refill  . aspirin 81 MG tablet Take 81 mg by mouth daily.    . budesonide (ENTOCORT EC) 3 MG 24 hr capsule Take 2 capsules (6 mg total) by mouth daily. 30 capsule 0  . Cholecalciferol (VITAMIN D) 2000 UNITS tablet Take 2,000 Units by mouth daily.     . digoxin (LANOXIN) 0.125 MG tablet TAKE 1/2 TABLET (0.0625MG ) BY MOUTH ONCE DAILY 90 tablet 1  . enalapril (VASOTEC) 20 MG tablet Take 1 tablet (20 mg total) by mouth 2 (two) times daily. 180 tablet 3  . furosemide (LASIX) 40 MG tablet Take 1 tablet (40 mg total) by mouth 2 (two) times daily. 60 tablet 0  . magnesium oxide (MAG-OX) 400 (241.3 Mg) MG tablet Take 1 tablet (400 mg total) by mouth daily. 30 tablet 0  . Melatonin 10 MG TABS Take 10 mg by mouth at bedtime.     . metolazone (ZAROXOLYN) 2.5 MG tablet Take 2.5 mg by mouth as directed. Take 1 by mouth once a week.    . metoprolol (LOPRESSOR) 50 MG tablet TAKE 1 TABLET (50 MG TOTAL) BY MOUTH 2 (TWO) TIMES DAILY. (Patient taking differently: No sig reported) 180 tablet 3  . Multiple Vitamin (MULTIVITAMIN WITH MINERALS) TABS Take 1 tablet by mouth daily. Centrum Silver    . potassium chloride (KLOR-CON) 20 MEQ packet Take 20 mEq by mouth daily. 30 packet 0  . Probiotic Product (ALIGN) 4 MG CAPS Take 4 mg by mouth every morning.    . silver sulfADIAZINE (SILVADENE) 1 % cream Apply 1 application topically daily. 50 g 0  . simvastatin (ZOCOR) 20 MG tablet TAKE 1 TABLET BY MOUTH AT BEDTIME. 90 tablet 3  . tamsulosin (FLOMAX) 0.4 MG CAPS capsule TAKE 1 CAPSULE (0.4 MG TOTAL) BY MOUTH DAILY. 90 capsule 3  .  traMADol (ULTRAM) 50 MG tablet TAKE 1 TABLET BY MOUTH 3 TIMES A DAY AS NEEDED 90 tablet 0   No current facility-administered medications on file prior to visit.    Past Medical History  Diagnosis Date  . Hypertension   . Arthritis   . Coronary artery disease     a. s/p CABG in 2011  . 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 5 x 4.9, proximal descending thoracic aortic aorta measuring 5.8 x 5.1, the aortic 5.6 x 5.5 and distal thoracic aorta 3.9 x 4.1, His infrarenal aneurysm measured 3 x 2.7.  . Blood transfusion   . Chronic combined systolic and diastolic CHF (congestive heart failure) ([name])  03/24/2011    a. EF 35-40% by echo in 02/2015 w/ severe global hypokinesis, aortic aneurysm 4.9 cm, trivial AI, moderate MR, and severe TR  . HOH (hard of hearing)     bilateral hearing aids  . History of stress test 12/2011    Showed mild inferior thinning felt most likely due to attenuation artifact.  . Chronic atrial fibrillation ([name])     a. Chads2vasc score is 5 (CHF, HTN, CAD, Age (4)). Not currently on anticoagulation secondary to aneurysm    Past Surgical History  Procedure Laterality Date  . Replacement total knee      bilaterally  . Joint replacement      Bilateral knee  . Tonsillectomy    . Hernia repair    . Cardiac catheterization    . Coronary artery bypass graft  Feb 2011  . Ankle fusion  04/22/2012    Procedure: ARTHRODESIS ANKLE;  Surgeon: Wylene Simmer, MD;  Location: [name];  Service: Orthopedics;  Laterality: Left;  Left Ankle and Subtalar Arthrodesis     Social History  reports that he quit smoking about 52 years ago. He has never used smokeless tobacco. He reports that he does not drink alcohol or use illicit drugs.  Family History family history includes COPD in his brother; Hypertension in his father. There is no history of Diabetes, Cancer, Heart attack, or Stroke.    Review of Systems  Constitutional: Negative.   Respiratory: Positive for shortness of breath.   Cardiovascular: Positive for leg swelling.  Gastrointestinal: Negative.   Musculoskeletal: Negative.   Neurological: Negative.   Hematological: Negative.   Psychiatric/Behavioral: Negative.   All other systems reviewed and are negative.   BP 104/60 mmHg  Pulse 66  Ht 5\' 10"  (1.778 m)  Wt 242 lb 8 oz (109.997 kg)  BMI 34.80 kg/m2  Physical Exam  Constitutional: He is oriented to person, place, and time. He appears well-developed and well-nourished.  HENT:  Head: Normocephalic.  Nose: Nose normal.  Mouth/Throat: Oropharynx is clear and moist.  Eyes: Conjunctivae are normal. Pupils  are equal, round, and reactive to light.  Neck: Normal range of motion. Neck supple. No JVD present.  Cardiovascular: Normal rate, normal heart sounds and intact distal pulses.  An irregularly irregular rhythm present. Exam reveals no gallop and no friction rub.   No murmur heard. 2+ pitting edema to below the knees bilaterally  Pulmonary/Chest: Effort normal. No respiratory distress. He has decreased breath sounds in the right lower field. He has no wheezes. He has no rales. He exhibits no tenderness.  Abdominal: Soft. Bowel sounds are normal. He exhibits no distension. There is no tenderness.  Musculoskeletal: Normal range of motion. He exhibits edema. He exhibits no tenderness.  Lymphadenopathy:    He has  no cervical adenopathy.  Neurological: He is alert and oriented to person, place, and time. Coordination normal.  Skin: Skin is warm and dry. No rash noted. No erythema.  Psychiatric: He has a normal mood and affect. His behavior is normal. Judgment and thought content normal.

## 2015-08-03 NOTE — Patient Instructions (Addendum)
You are doing well.  Goal weight is 225 to 227 For weight > 230, take metolazone 2.5 mg in the Am, followed by lasix 40 mg twice as day For weight > 234, take metolazone 5 mg  in the Am, followed by lasix 40 mg twice as day  Call the office for weight in the high 230s  Take extra potassium when you take metolazone  Please call us if you have new issues that need to be addressed before your next appt.  Your physician wants you to follow-up in: 2 weeks

## 2015-08-03 NOTE — Telephone Encounter (Signed)
Dr. Rockey Situ has nothing available and pt has never been seen in the office. Would you consider adding him on to be seen?

## 2015-08-05 ENCOUNTER — Telehealth: Payer: Self-pay | Admitting: Physician Assistant

## 2015-08-05 NOTE — Telephone Encounter (Signed)
    Patient's daughter called about a 2 lb weight gain and slight  Worsening of SOB and LE edema. He was recently admitted for acute on chronic combined S/D CHF. He saw Dr. Rockey Situ for post hosp follow up on 08/03/15. It was decided to keep him on Lasix 40mg  BID with metolazone 5mg  30 minutes prior to his AM Lasix dose for weights >230 lbs. He was over this 230 lb threshold yesterday and he was given the 5mg  metolazone. He was again >230 today and daughter only gave him 2.5mg  metolazone. He is up to 238 lbs today. I advised her to take an extra 40mg  Lasix/ 53mEq Kdur tonight followed by 5mg  metolazone with an extra potassium tomorrow AM if he stays above 230 lbs. I recommended that she continue with the 5mg  tablet vs 2.5 mg in the future. She verbalized understanding.   Will forward to Dr Rockey Situ. He has an appointment Christell Faith on 4/26 for follow up.  Angelena Form PA-C  [facility]

## 2015-08-06 ENCOUNTER — Other Ambulatory Visit: Payer: Self-pay | Admitting: *Deleted

## 2015-08-06 DIAGNOSIS — I712 Thoracic aortic aneurysm, without rupture, unspecified: Secondary | ICD-10-CM

## 2015-08-06 MED ORDER — TORSEMIDE 20 MG PO TABS: 40.0000 mg | ORAL_TABLET | Freq: Two times a day (BID) | ORAL | Status: AC

## 2015-08-06 NOTE — Telephone Encounter (Addendum)
Spoke w/ [name].  Advised her of Dr. Donivan Scull recommendation. She verbalizes understanding and will call back later this week w/ update.

## 2015-08-06 NOTE — Telephone Encounter (Signed)
Would stop Lasix Would send in a prescription for torsemide 40 mg twice a day  Algorithm as below Goal weight is 225 to 227 Take torsemide 40 mg twice a day  For weight > 235, take metolazone 2.5 mg in the Am, followed by torsemide 40 mg twice as day For weight > 237, take metolazone 5 mg in the Am, followed by torsemide 40 mg twice as day  Take extra potassium when you take metolazone Would call us in the next day or so with details on his response to torsemide

## 2015-08-06 NOTE — Telephone Encounter (Signed)
Pt daughter calling stating pt is "ozzing" out of his shoes Please advise.

## 2015-08-06 NOTE — Telephone Encounter (Signed)
Please make sure patient is elevating his legs. If he did not take the extra Lasix 40 mg/metolazone 5 mg with extra Kdur 20 meq this morning please have him take this. If he did take these this morning we need to know his weight. If his weight continues to be elevated above 230 lb take Lasix 40 mg now with Kdur 20 meq.

## 2015-08-06 NOTE — Addendum Note (Signed)
Addended by: Dede Query R on: 08/06/2015 03:54 PM   Modules accepted: Orders, Medications

## 2015-08-06 NOTE — Discharge Summary (Signed)
[place] [facility] [place] NAME: Lee Gutierrez    MR#:  [id number]  DATE OF BIRTH:  09-03-1942  DATE OF ADMISSION:  07/28/2015 ADMITTING PHYSICIAN: Bettey Costa, MD  DATE OF DISCHARGE: 08/01/2015  3:01 PM  PRIMARY CARE PHYSICIAN: Viviana Simpler, MD    ADMISSION DIAGNOSIS:  Hyponatremia [E87.1] Acute on chronic systolic congestive heart failure ([place]) [I50.23]  DISCHARGE DIAGNOSIS:  Active Problems:   Essential hypertension   Coronary atherosclerosis   Chronic atrial fibrillation (H[facility])   Hyponatremia   Acute on chronic combined systolic (congestive) and diastolic (congestive) heart failure ([place])   SECONDARY DIAGNOSIS:   Past Medical History  Diagnosis Date  . Hypertension   . Arthritis   . Coronary artery disease     a. s/p CABG in 2011  . 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 5 x 4.9, proximal descending thoracic aortic aorta measuring 5.8 x 5.1, the aortic 5.6 x 5.5 and distal thoracic aorta 3.9 x 4.1, His infrarenal aneurysm measured 3 x 2.7.  . Blood transfusion   . Chronic combined systolic and diastolic CHF (congestive heart failure) ([place]) 03/24/2011    a. EF 35-40% by echo in 02/2015 w/ severe global hypokinesis, aortic aneurysm 4.9 cm, trivial AI, moderate MR, and severe TR  . HOH (hard of hearing)     bilateral hearing aids  . History of stress test 12/2011    Showed mild inferior thinning felt most likely due to attenuation artifact.  . Chronic atrial fibrillation ([place])     a. Chads2vasc score is 5 (CHF, HTN, CAD, Age (80)). Not currently on anticoagulation secondary to aneurysm    HOSPITAL COURSE:   1. Acute on chronic combined systolic and diastolic heart failure: Probably from recent steroid use contributed to CHF and weight gain. Also was drinking too much liquids a day. Clinically improving with IV Lasix. Appreciate [facility] recs Monitor input and output and  daily weight Monitor BMP  Still have some edema on legs. Cardio suggesting to continue IV lasix one more day.  2. Chronic atrial fibrillation: Continue digoxin, aspirin, metoprolol Patient is not a good candidate for Coumadin in view of aortic dissection, not a surgical candidate Rate is controlled.  3. Hyponatremia: This is due to CHF exacerbation. Monitor sodium while on Lasix. Have some drop, will switch to oral lasix soon.  4. Hyperkalemia: This is due to acute kidney injury from CHF exacerbation. Hold ACE inhibitor for now. Insulin, dextrose and Kayexalate for hyperkalemia. Potassium low again, replace oral.  5. Essential hypertension: Continue metoprolol and monitor blood pressure.  Under control.  6. BPH: Continue Tamsulosin.  6. Chronic diarrhea started on Entocort Follow-up with gastroenterology    DISCHARGE CONDITIONS:   Stable.  CONSULTS OBTAINED:  Treatment Team:  Thompson Grayer, MD  DRUG ALLERGIES:  No Known Allergies  DISCHARGE MEDICATIONS:   Discharge Medication List as of 08/01/2015  2:19 PM    START taking these medications   Details  magnesium oxide (MAG-OX) 400 (241.3 Mg) MG tablet Take 1 tablet (400 mg total) by mouth daily., Starting 08/01/2015, Until Discontinued, Normal    potassium chloride (KLOR-CON) 20 MEQ packet Take 20 mEq by mouth daily., Starting 08/01/2015, Until Discontinued, Print      CONTINUE these medications which have CHANGED   Details  budesonide (ENTOCORT EC) 3 MG 24 hr capsule Take 2 capsules (6 mg total) by mouth daily., Starting 08/01/2015, Until Discontinued, Normal  furosemide (LASIX) 40 MG tablet Take 1 tablet (40 mg total) by mouth 2 (two) times daily., Starting 08/01/2015, Until Discontinued, Normal      CONTINUE these medications which have NOT CHANGED   Details  aspirin 81 MG tablet Take 81 mg by mouth daily., Until Discontinued, Historical Med    Cholecalciferol (VITAMIN D) 2000 UNITS tablet Take 2,000 Units  by mouth daily., Until Discontinued, Historical Med    digoxin (LANOXIN) 0.125 MG tablet TAKE 1/2 TABLET (0.0625MG ) BY MOUTH ONCE DAILY, Normal    enalapril (VASOTEC) 20 MG tablet Take 1 tablet (20 mg total) by mouth 2 (two) times daily., Starting 03/26/2015, Until Discontinued, Normal    Melatonin 10 MG TABS Take 10 mg by mouth at bedtime. , Until Discontinued, Historical Med    metolazone (ZAROXOLYN) 2.5 MG tablet Take 2.5 mg by mouth as directed. Take 1 by mouth once a week., Until Discontinued, Historical Med    metoprolol (LOPRESSOR) 50 MG tablet TAKE 1 TABLET (50 MG TOTAL) BY MOUTH 2 (TWO) TIMES DAILY., Normal    Multiple Vitamin (MULTIVITAMIN WITH MINERALS) TABS Take 1 tablet by mouth daily. Centrum Silver, Until Discontinued, Historical Med    Probiotic Product (ALIGN) 4 MG CAPS Take 4 mg by mouth every morning., Until Discontinued, Historical Med    silver sulfADIAZINE (SILVADENE) 1 % cream Apply 1 application topically daily., Starting 05/23/2015, Until Discontinued, Normal    simvastatin (ZOCOR) 20 MG tablet TAKE 1 TABLET BY MOUTH AT BEDTIME., Normal    tamsulosin (FLOMAX) 0.4 MG CAPS capsule TAKE 1 CAPSULE (0.4 MG TOTAL) BY MOUTH DAILY., Normal    traMADol (ULTRAM) 50 MG tablet TAKE 1 TABLET BY MOUTH 3 TIMES A DAY AS NEEDED, Phone In      STOP taking these medications     amoxicillin (AMOXIL) 500 MG capsule      potassium chloride (K-DUR) 10 MEQ tablet          DISCHARGE INSTRUCTIONS:   Follow with cardiology in 2 weeks.  If you experience worsening of your admission symptoms, develop shortness of breath, life threatening emergency, suicidal or homicidal thoughts you must seek medical attention immediately by calling 911 or calling your MD immediately  if symptoms less severe.  You Must read complete instructions/literature along with all the possible adverse reactions/side effects for all the Medicines you take and that have been prescribed to you. Take any new  Medicines after you have completely understood and accept all the possible adverse reactions/side effects.   Please note  You were cared for by a hospitalist during your hospital stay. If you have any questions about your discharge medications or the care you received while you were in the hospital after you are discharged, you can call the unit and asked to speak with the hospitalist on call if the hospitalist that took care of you is not available. Once you are discharged, your primary care physician will handle any further medical issues. Please note that NO REFILLS for any discharge medications will be authorized once you are discharged, as it is imperative that you return to your primary care physician (or establish a relationship with a primary care physician if you do not have one) for your aftercare needs so that they can reassess your need for medications and monitor your lab values.    Today   CHIEF COMPLAINT:   Chief Complaint  Patient presents with  . CHF symptoms     HISTORY OF PRESENT ILLNESS:  Brenton Grills  is a 82 y.o. male with a known history of Chronic systolic and diastolic heart failure with ejection fraction of 40%, atrial fibrillation thoracic aortic aneurysm and essential hypertension who presents with above complaint. Over the past week patient had increasing weight gain. He was recently started on steroids for chronic diarrhea. He has tested negative for C. difficile 2 and other viruses. Since the time of steroids he has increased his weight 2/20 pounds. He was instructed to take metolazone since Thursday by the PA of cardiology.Marland Kitchen He also is complaining of some shortness of breath. Denies PND or orthopnea. He also denies chest pain.   VITAL SIGNS:  Blood pressure 128/81, pulse 109, temperature 97.3 F (36.3 C), temperature source Oral, resp. rate 20, height 5\' 11"  (1.803 m), weight 103.738 kg (228 lb 11.2 oz), SpO2 98 %.  I/O:  No intake or output data in the  24 hours ending 08/06/15 0926  PHYSICAL EXAMINATION:   GENERAL: 82 y.o.-year-old patient lying in the bed with no acute distress.  EYES: Pupils equal, round, reactive to light and accommodation. No scleral icterus. Extraocular muscles intact.  HEENT: Head atraumatic, normocephalic. Oropharynx and nasopharynx clear.  NECK: Supple, no jugular venous distention. No thyroid enlargement, no tenderness.  LUNGS: Moderate breath sounds bilaterally, no wheezing, positive crepitation. No use of accessory muscles of respiration.  CARDIOVASCULAR: S1, S2 normal. Positive murmurs, rubs, or gallops.  ABDOMEN: Soft, nontender, nondistended. Bowel sounds present. No organomegaly or mass.  EXTREMITIES: + pedal edema, no cyanosis, or clubbing.  NEUROLOGIC: Cranial nerves II through XII are intact. Muscle strength 5/5 in all extremities. Sensation intact. Gait not checked.  PSYCHIATRIC: The patient is alert and oriented x 3.  SKIN: No obvious rash, lesion, or ulcer.   DATA REVIEW:   CBC No results for input(s): WBC, HGB, HCT, PLT in the last 168 hours.  Chemistries   Recent Labs Lab 07/31/15 0410 08/01/15 0624  NA 132* 130*  K 3.0* 3.5  CL 92* 92*  CO2 35* 29  GLUCOSE 101* 103*  BUN 39* 30*  CREATININE 0.87 0.88  CALCIUM 8.8* 8.8*  MG 1.8  --     Cardiac Enzymes No results for input(s): TROPONINI in the last 168 hours.  Microbiology Results  Results for orders placed or performed during the hospital encounter of 07/20/15  C difficile quick scan w PCR reflex     Status: None   Collection Time: 07/20/15  6:37 AM  Result Value Ref Range Status   C Diff antigen NEGATIVE NEGATIVE Final   C Diff toxin NEGATIVE NEGATIVE Final   C Diff interpretation Negative for C. difficile  Final    RADIOLOGY:  No results found.  EKG:   Orders placed or performed during the hospital encounter of 07/28/15  . ED EKG within 10 minutes  . ED EKG within 10 minutes  . EKG 12-Lead  . EKG  12-Lead  . EKG      Management plans discussed with the patient, family and they are in agreement.  CODE STATUS:  Code Status History    Date Active Date Inactive Code Status Order ID Comments User Context   07/28/2015  4:30 PM 08/01/2015  6:02 PM DNR [id number]  Bettey Costa, MD Inpatient   03/26/2011  6:34 PM 03/27/2011  1:55 PM Full Code [id number]  Jonetta Osgood, MD Inpatient   03/24/2011 10:27 PM 03/26/2011  6:33 PM DNR [id number]  Linus Galas, RN Inpatient    Questions for Most Recent Historical Code  Status (Order [id number])    Question Answer Comment   In the event of cardiac or respiratory ARREST Do not call a "code blue"    In the event of cardiac or respiratory ARREST Do not perform Intubation, CPR, defibrillation or ACLS    In the event of cardiac or respiratory ARREST Use medication by any route, position, wound care, and other measures to relive pain and suffering. May use oxygen, suction and manual treatment of airway obstruction as needed for comfort.       TOTAL TIME TAKING CARE OF THIS PATIENT: 35 minutes.    Vaughan Basta M.D on 08/06/2015 at 9:26 AM  Between 7am to 6pm - Pager - [phone]  After 6pm go to [email or web address] - password [email or web address] [facility]  Office  [phone]  CC: Primary care physician; Viviana Simpler, MD   Note: This dictation was prepared with Dragon dictation along with smaller phrase technology. Any transcriptional errors that result from this process are unintentional.

## 2015-08-06 NOTE — Telephone Encounter (Signed)
Spoke w/ pt's daughter.  Advised her that this message was not routed to provider and he was just made aware.  She reports that pt weighs 237 lbs today. Pt has trouble urinating, she does limit his fluid & sodium intake. Since this weekend, pt has had 2 cups of coffee, approx 20 oz total fluids daily, frozen waffle w/ syrup for breakfast, homemade egg salad & coleslaw for lunch, cabbage, no sodium tomato sauce, yogurt, 2 fried eggs w/ jelly, all w/ no added salt.   Reports that when pt walks, water is squishing out of his feet and filling his crocs. She is crying on the phone and asking if this is an emergency. She states that pt is doing everything he is supposed to be doing, the is just not urinating. Advised her that I will discuss w/ Dr. Rockey Situ and call her back w/ his recommendation.

## 2015-08-06 NOTE — Telephone Encounter (Signed)
Pt daughter calling stating pt is having

## 2015-08-07 ENCOUNTER — Telehealth: Payer: Self-pay | Admitting: Cardiovascular Disease

## 2015-08-07 NOTE — Telephone Encounter (Signed)
I am not expecting a dramatic change in leg swelling in less than 24 hours Fluid will come out of lungs first, abdomen second, legs last They can try Ace wraps from toes up to the knees bilaterally, on one hour and then off Would you Ace wraps 3 times per day with leg elevation Need to keep urinating with torsemide Minimize fluid intake Would set up follow-up clinic visit

## 2015-08-07 NOTE — Telephone Encounter (Signed)
Blain Pais at 08/07/2015 4:59 PM             Pt daughter called, states pt has had no change,. Legs and feet are still swollen, weight has not gone down. No oozing. Please call.

## 2015-08-07 NOTE — Telephone Encounter (Signed)
Please see previous phone note.  

## 2015-08-07 NOTE — Telephone Encounter (Signed)
Pt daughter called, states pt has had no change,. Legs and feet are still swollen, weight has not gone down. No oozing. Please call.

## 2015-08-08 NOTE — Telephone Encounter (Signed)
Patients daughter states that his weight was down some this morning but he still has some swelling in his abdomen and legs. Discussed at length Dr. Gwenyth Ober recommendations and no further questions at this time. Let her know to call back for any further problems or questions.

## 2015-08-10 ENCOUNTER — Encounter: Payer: Self-pay | Admitting: Internal Medicine

## 2015-08-11 ENCOUNTER — Other Ambulatory Visit: Payer: Self-pay | Admitting: Internal Medicine

## 2015-08-13 NOTE — Telephone Encounter (Signed)
Last refill 05-02-15 Last OV 12-25-14 Next OV 12-31-15

## 2015-08-13 NOTE — Telephone Encounter (Signed)
Spoke to daughter, [name] per [place]. She said he had a skin tear but it was healing up beautifully and does not think he needs an OV. She appreciated the call. Left refill on voice mail at pharmacy

## 2015-08-13 NOTE — Telephone Encounter (Signed)
Approved: #[phone] x 0 Find out if he needs to come in for me to check his leg for infection

## 2015-08-15 ENCOUNTER — Encounter: Payer: Medicare Other | Admitting: Physician Assistant

## 2015-08-17 ENCOUNTER — Telehealth: Payer: Self-pay | Admitting: Physician Assistant

## 2015-08-17 NOTE — Telephone Encounter (Signed)
Per patient daughter, his BP has been getting high lately, now 150/94. This is accompanied by mild SOB. He takes 20mg  BID enalapril and 50mg  metoprolol. His weight has been stable, no sign of fluid overload per daughter.   He had class aortic dissection recently, he need to control his SBP. However upon further talking with daughter it sounds like he has not received PM dose of medication. He usually takes his AM medication around 6AM, he is a little late today on taking PM dose, we maybe seeing the tail end of effect of his antihypetensive meds. I have instructed her to give him his meds and recheck his BP in 2 hours, if SBP still 140 or above and HR is > 60, she may given him additional 25mg  metoprolol. As for SOB, I am hesitant to give him any additional demadex as his weight has been stable and dropped a little. It does not sound like he is fluid overloaded based on his daughter's description.  Lee Corrigan PA Pager: [phone]

## 2015-08-20 ENCOUNTER — Other Ambulatory Visit: Payer: Self-pay | Admitting: Internal Medicine

## 2015-08-22 ENCOUNTER — Ambulatory Visit: Payer: Medicare Other | Admitting: Family

## 2015-08-29 ENCOUNTER — Encounter: Payer: Self-pay | Admitting: Surgery

## 2015-08-29 ENCOUNTER — Ambulatory Visit
Admission: RE | Admit: 2015-08-29 | Discharge: 2015-08-29 | Disposition: A | Discharge status: Home / Self Care | Payer: Medicare Other | Source: Ambulatory Visit | Attending: Surgery | Admitting: Surgery

## 2015-08-29 ENCOUNTER — Ambulatory Visit (INDEPENDENT_AMBULATORY_CARE_PROVIDER_SITE_OTHER): Payer: Medicare Other | Admitting: Surgery

## 2015-08-29 VITALS — BP 120/68 | HR 50 | Resp 20 | Ht 70.0 in | Wt 242.0 lb

## 2015-08-29 DIAGNOSIS — I712 Thoracic aortic aneurysm, without rupture, unspecified: Secondary | ICD-10-CM

## 2015-08-29 MED ORDER — IOPAMIDOL (ISOVUE-370) INJECTION 76%
75.0000 mL | Freq: Once | INTRAVENOUS | Status: AC | PRN
  Administered 2015-08-29: 75 mL via INTRAVENOUS

## 2015-08-30 ENCOUNTER — Encounter: Payer: Self-pay | Admitting: Surgery

## 2015-08-30 NOTE — Progress Notes (Signed)
HPI:  The patient returns to my office today for followup of an ascending, arch, and descending thoracoabdominal aneurysm with a limited, chronic,type B aortic dissection. He underwent CABG by me in 2011. I last saw him on 09/07/2014 at which time a CT angiogram of the chest and abdomen showed slowly progressive aneurysmal dilatation of the aorta to 5.9 cm from 5.7 cm the year before. Over the past year he denies any chest or back pain. 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 He was recently in the hospital for treatment of congestive heart failure with significant volume overload and lower extremity edema.   Current Outpatient Prescriptions  Medication Sig Dispense Refill  . aspirin 81 MG tablet Take 81 mg by mouth daily.    . budesonide (ENTOCORT EC) 3 MG 24 hr capsule Take 2 capsules (6 mg total) by mouth daily. 30 capsule 0  . Cholecalciferol (VITAMIN D) 2000 UNITS tablet Take 2,000 Units by mouth daily.    . digoxin (LANOXIN) 0.125 MG tablet TAKE 1/2 TABLET (0.0625MG ) BY MOUTH ONCE DAILY 90 tablet 1  . enalapril (VASOTEC) 20 MG tablet Take 1 tablet (20 mg total) by mouth 2 (two) times daily. 180 tablet 3  . magnesium oxide (MAG-OX) 400 (241.3 Mg) MG tablet TAKE 1 TABLET (400 MG TOTAL) BY MOUTH DAILY. 30 tablet 2  . Melatonin 10 MG TABS Take 10 mg by mouth at bedtime.     . metolazone (ZAROXOLYN) 2.5 MG tablet Take 2.5 mg by mouth as directed. Take 1 by mouth once a week.    . metoprolol (LOPRESSOR) 50 MG tablet TAKE 1 TABLET (50 MG TOTAL) BY MOUTH 2 (TWO) TIMES DAILY. (Patient taking differently: No sig reported) 180 tablet 3  . Multiple Vitamin (MULTIVITAMIN WITH MINERALS) TABS Take 1 tablet by mouth daily. Centrum Silver    . potassium chloride (KLOR-CON) 20 MEQ packet Take 20 mEq by mouth daily. 30 packet 0  . Probiotic Product (ALIGN) 4 MG CAPS Take 4 mg by mouth every morning.    . silver sulfADIAZINE  (SILVADENE) 1 % cream Apply 1 application topically daily. 50 g 0  . simvastatin (ZOCOR) 20 MG tablet TAKE 1 TABLET BY MOUTH AT BEDTIME. 90 tablet 3  . tamsulosin (FLOMAX) 0.4 MG CAPS capsule TAKE 1 CAPSULE (0.4 MG TOTAL) BY MOUTH DAILY. 90 capsule 3  . torsemide (DEMADEX) 20 MG tablet Take 2 tablets (40 mg total) by mouth 2 (two) times daily. 160 tablet 6  . traMADol (ULTRAM) 50 MG tablet TAKE 1 TABLET BY MOUTH 3 TIMES A DAY AS NEEDED 90 tablet 0   No current facility-administered medications for this visit.     Physical Exam: BP 120/68 mmHg  Pulse 50  Resp 20  Ht 5\' 10"  (1.778 m)  Wt 242 lb (109.77 kg)  BMI 34.72 kg/m2  SpO2 95% Elderly and frail Cardiac exam shows a regular rate and rhythm. There is a 1/6 systolic murmur. There is no diastolic murmur.  Lungs are clear. Mild edema in legs and some healing sores.  Diagnostic Tests:  CLINICAL DATA: 82 year old male with thoracic aortic aneurysm  EXAM: CT ANGIOGRAPHY CHEST WITH CONTRAST  TECHNIQUE: Multidetector CT imaging of the chest was performed using the standard protocol during bolus administration of intravenous contrast. Multiplanar CT image reconstructions and MIPs were obtained to evaluate the vascular anatomy.  CONTRAST: 75 mL Isovue 370  COMPARISON: Prior CTA chest 09/06/2014  FINDINGS: Mediastinum: Unremarkable CT appearance of the thyroid gland. No suspicious mediastinal or hilar adenopathy. No soft tissue mediastinal mass. The thoracic esophagus is unremarkable.  Heart/Vascular: Aneurysmal and highly tortuous thoracic aorta again demonstrated. Stable enlargement of the aortic root at 5 cm. The tubular portion of the ascending thoracic aorta is similar to incrementally enlarged at 5 cm in maximal diameter today compared to 4.9 cm previously. The descending thoracic aorta has a maximal diameter of 5.9 cm (best measured on the coronal reformatted images) which is unchanged compared to prior.  Conventional 3 vessel arch anatomy. The origins of the great vessels are highly tortuous and ectatic. No evidence of dissection or penetrating ulcer. Minimal residual sequelae from remote prior dissection remains unchanged in the proximal descending thoracic aorta.  The heart is enlarged, particularly the right heart. No pericardial effusion. Calcifications present throughout the coronary arteries. Patient is status post median sternotomy with evidence of prior multivessel CABG.  Lungs/Pleura: Dependent atelectasis bilaterally. Subpleural reticulation and mild architectural distortion on the periphery of the lungs suggests early fibrotic change. There may be minimal interlobular septal thickening consistent with trace interstitial edema. Evaluation for small nodules is limited by respiratory motion artifact. No pleural effusion or focal airspace consolidation. Diffuse mild bronchial wall thickening.  Bones/Soft Tissues: No acute fracture or aggressive appearing lytic or blastic osseous lesion. Healed median sternotomy. Multilevel degenerative disc disease.  Upper Abdomen: Cholelithiasis. Incompletely imaged low-attenuation lesion exophytic from the upper pole of the left kidney does not appear significantly increased in size.  Review of the MIP images confirms the above findings.  IMPRESSION: VASCULAR  1. Essentially stable aneurysmal dilatation of the ascending and descending thoracic aorta as detailed above. The tubular portion of the ascending thoracic aorta may be incrementally larger at 5 cm today compared to 4.9 cm on 09/06/2014 although this slight difference may be within measurement error. The maximal diameter of the descending thoracic aorta is again noted at 5.9 cm. 2. Atherosclerotic vascular calcifications including coronary artery disease. 3. Tortuosity of the aortic branch vessels. NON VASCULAR  1. Ancillary findings as above without significant  interval change.  Signed,  Criselda Peaches, MD  [facility]  [facility]   Electronically Signed  By: Jacqulynn Cadet M.D.  On: 08/29/2015 14:19   Impression:  The diffuse aneurysmal enlargement of the aorta is stable with the maximum diameter at 5.9 cm in the descending aorta. He is not a candidate for open surgical repair and I think the aorta proximally and distally is probably too large to allow stent graft deployment. Since he is 81 and very frail I think  good blood pressure control would be best. I reviewed the scans with the patient and his daughter and answered all of their questions. I don't think there is any reason to continue doing scans in this patient since he is not a candidate for any treatment for this. I discussed this with them and they are in agreement.  Plan:  He will continue to follow up with his PCP.   Gaye Pollack, MD [facility] [phone]

## 2015-09-05 ENCOUNTER — Encounter: Payer: Self-pay | Admitting: Physician Assistant

## 2015-09-05 ENCOUNTER — Ambulatory Visit (INDEPENDENT_AMBULATORY_CARE_PROVIDER_SITE_OTHER): Payer: Medicare Other | Admitting: Physician Assistant

## 2015-09-05 VITALS — BP 118/64 | HR 54 | Ht 70.0 in | Wt 217.0 lb

## 2015-09-05 DIAGNOSIS — R0602 Shortness of breath: Secondary | ICD-10-CM

## 2015-09-05 DIAGNOSIS — I10 Essential (primary) hypertension: Secondary | ICD-10-CM | POA: Diagnosis not present

## 2015-09-05 DIAGNOSIS — I482 Chronic atrial fibrillation, unspecified: Secondary | ICD-10-CM

## 2015-09-05 DIAGNOSIS — I2510 Atherosclerotic heart disease of native coronary artery without angina pectoris: Secondary | ICD-10-CM | POA: Diagnosis not present

## 2015-09-05 DIAGNOSIS — I716 Thoracoabdominal aortic aneurysm, without rupture, unspecified: Secondary | ICD-10-CM

## 2015-09-05 DIAGNOSIS — I5042 Chronic combined systolic (congestive) and diastolic (congestive) heart failure: Secondary | ICD-10-CM

## 2015-09-05 DIAGNOSIS — E785 Hyperlipidemia, unspecified: Secondary | ICD-10-CM

## 2015-09-05 NOTE — Patient Instructions (Signed)
Medication Instructions:  Your physician has recommended you make the following change in your medication:  STOP taking digoxin   Labwork: BMET and digoxin level today  Testing/Procedures: none  Follow-Up: Your physician recommends that you schedule a follow-up appointment in: 3 months with Dr. Rockey Situ.    Any Other Special Instructions Will Be Listed Below (If Applicable).     If you need a refill on your cardiac medications before your next appointment, please call your pharmacy.  3

## 2015-09-05 NOTE — Progress Notes (Signed)
Cardiology Office Note Date:  09/05/2015  Patient ID:  Lee Gutierrez, Lee Gutierrez [birth year], MRN [id number] PCP:  Viviana Simpler, MD  Cardiologist:  Dr. Rockey Situ, MD    Chief Complaint: Routine follow up of the below  History of Present Illness: Lee Gutierrez is a 82 y.o. male with history of CAD s/p CABG 2011, chronic combined CHF, chronic Afib not currently on long term full-dose anticoagulation 2/2 history of aortic dissection and ongoing aoric aneurysm, ascending, arch, and descending thoracoabdominal aneurysm with a limited, chronic type B aortic dissection, HTN, HLD, and chronic diarrhea who presents for routine follow up of the above.   Recently admitted to [facility] in mid March with SOB, weight gain, and LE edema. He diuresed 10 L during his admission with dramatic improvement in his abdominal and LE swelling. Prior to this admission his diuretics had been held in the setting of diarrhea, possibly leading to volume overload. Baseline weight approximately 225 pounds. Discharge weight from hosptial in mid March was 234 pounds. Last echo from 02/26/2015 showed an EF of 35-40%, diffuse HK, not technically sufficient to allow for LV diastolic function, aortic sclerosis without stenosis, trivial AI, aortic root dimension of 40 mm, ascending aortic diameter of 49 mm, distal descending aorta 48-49 mm, moderate MR, left atrium dilated at 49 mm, severe TR, PASP 70 mm Hg. He was seen by Dr. Rockey Situ, MD for hospital follow up on 3/24 with a weight of 242 pounds. It was advised he continue Lasix 40 mg bid with prn metolazone for weight > 230 pounds. He was seen by Dr. Cyndia Bent, MD on 4/19 for follow up of his aortic aneurysm which imaging showed for to be stable with a maximum diameter at 5.9 cm in the descending aorta when compared to study from 09/07/2014. He was not felt to be a candidate for open repair and the aneurysm was felt to be too large for graft deployment. Medical management was advised with no further  imaging follow up.  Today, he comes in feeling well. Since starting torsemide 40 mg bid on 3/26 his urine output has increased significantly. He has not needed prn metolazone since shortly after starting torsemide. He watches both his salt and PO fluid consumption. He continues to have a great appetite. No orthopnea, PND, or early satiety. No chest pain or palpitations. His blood pressure at home has been in the 1-teens to [id number] systolic. He is tolerating his medications without issues. Heart rate at home averages in the upper 40's to low 50's bpm. Asymptomatic. Neither he or his daughter have concerns at this time. He is considering cataract procedure.      Past Medical History  Diagnosis Date  . Hypertension   . Arthritis   . Coronary artery disease     a. s/p CABG in 2011  . 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 5 x 4.9, proximal descending thoracic aortic aorta measuring 5.8 x 5.1, the aortic 5.6 x 5.5 and distal thoracic aorta 3.9 x 4.1, His infrarenal aneurysm measured 3 x 2.7.  . Blood transfusion   . Chronic combined systolic and diastolic CHF (congestive heart failure) ([place]) 03/24/2011    a. EF 35-40% by echo in 02/2015 w/ severe global hypokinesis, aortic aneurysm 4.9 cm, trivial AI, moderate MR, and severe TR  . HOH (hard of hearing)     bilateral hearing aids  . History of stress test 12/2011    Showed mild inferior thinning felt most  likely due to attenuation artifact.  . Chronic atrial fibrillation ([place])     a. Chads2vasc score is 5 (CHF, HTN, CAD, Age (2)). Not currently on anticoagulation secondary to aneurysm    Past Surgical History  Procedure Laterality Date  . Replacement total knee      bilaterally  . Joint replacement      Bilateral knee  . Tonsillectomy    . Hernia repair    . Cardiac catheterization    . Coronary artery bypass graft  Feb 2011  . Ankle fusion  04/22/2012    Procedure: ARTHRODESIS ANKLE;  Surgeon: Wylene Simmer, MD;  Location: [place];  Service: Orthopedics;  Laterality: Left;  Left Ankle and Subtalar Arthrodesis     Current Outpatient Prescriptions  Medication Sig Dispense Refill  . aspirin 81 MG tablet Take 81 mg by mouth daily.    . budesonide (ENTOCORT EC) 3 MG 24 hr capsule Take 2 capsules (6 mg total) by mouth daily. 30 capsule 0  . Cholecalciferol (VITAMIN D) 2000 UNITS tablet Take 2,000 Units by mouth daily.    . enalapril (VASOTEC) 20 MG tablet Take 1 tablet (20 mg total) by mouth 2 (two) times daily. 180 tablet 3  . magnesium oxide (MAG-OX) 400 (241.3 Mg) MG tablet TAKE 1 TABLET (400 MG TOTAL) BY MOUTH DAILY. 30 tablet 2  . Melatonin 10 MG TABS Take 10 mg by mouth at bedtime.     . metolazone (ZAROXOLYN) 2.5 MG tablet Take 2.5 mg by mouth as directed. Take 1 by mouth once a week.    . metoprolol (LOPRESSOR) 50 MG tablet TAKE 1 TABLET (50 MG TOTAL) BY MOUTH 2 (TWO) TIMES DAILY. (Patient taking differently: No sig reported) 180 tablet 3  . Multiple Vitamin (MULTIVITAMIN WITH MINERALS) TABS Take 1 tablet by mouth daily. Centrum Silver    . potassium chloride (KLOR-CON) 20 MEQ packet Take 20 mEq by mouth daily. 30 packet 0  . Probiotic Product (ALIGN) 4 MG CAPS Take 4 mg by mouth every morning.    . silver sulfADIAZINE (SILVADENE) 1 % cream Apply 1 application topically daily. 50 g 0  . simvastatin (ZOCOR) 20 MG tablet TAKE 1 TABLET BY MOUTH AT BEDTIME. 90 tablet 3  . tamsulosin (FLOMAX) 0.4 MG CAPS capsule TAKE 1 CAPSULE (0.4 MG TOTAL) BY MOUTH DAILY. 90 capsule 3  . torsemide (DEMADEX) 20 MG tablet Take 2 tablets (40 mg total) by mouth 2 (two) times daily. 160 tablet 6  . traMADol (ULTRAM) 50 MG tablet TAKE 1 TABLET BY MOUTH 3 TIMES A DAY AS NEEDED 90 tablet 0   No current facility-administered medications for this visit.    Allergies:   Review of patient's allergies indicates no known allergies.   Social History:  The patient  reports that he quit smoking about 52 years ago. He has  never used smokeless tobacco. He reports that he does not drink alcohol or use illicit drugs.   Family History:  The patient's family history includes COPD in his brother; Hypertension in his father. There is no history of Diabetes, Cancer, Heart attack, or Stroke.  ROS:   Review of Systems  Constitutional: Positive for weight loss and malaise/fatigue. Negative for fever, chills and diaphoresis.  HENT: Negative for congestion.   Eyes: Negative for discharge and redness.  Respiratory: Negative for cough, hemoptysis, sputum production, shortness of breath and wheezing.   Cardiovascular: Positive for leg swelling. Negative for chest pain, palpitations, orthopnea, claudication and PND.  Improved leg swelling  Gastrointestinal: Negative for nausea and vomiting.  Musculoskeletal: Negative for myalgias and falls.  Skin: Negative for rash.  Neurological: Negative for dizziness, sensory change, speech change, focal weakness, loss of consciousness and weakness.  Endo/Heme/Allergies: Does not bruise/bleed easily.  Psychiatric/Behavioral: Negative for substance abuse. The patient is not nervous/anxious.   All other systems reviewed and are negative.     PHYSICAL EXAM:  VS:  BP 118/64 mmHg  Pulse 54  Ht 5\' 10"  (1.778 m)  Wt 217 lb (98.431 kg)  BMI 31.14 kg/m2 BMI: Body mass index is 31.14 kg/(m^2). Well nourished, well developed, in no acute distress HEENT: normocephalic, atraumatic Neck: no JVD, carotid bruits or masses Cardiac: bradycardic, irregularly-irregular, S1, S2; RRR; no murmurs, rubs, or gallops Lungs:  clear to auscultation bilaterally, no wheezing, rhonchi or rales Abd: soft, nontender, no hepatomegaly, + BS MS: no deformity or atrophy Ext: trace pretibial edema along bilateral LE Skin: warm and dry, no rash Neuro:  moves all extremities spontaneously, no focal abnormalities noted, follows commands Psych: euthymic mood, full affect   EKG:  Was ordered today. Shows Afib  with bradycardic heart rate of 55 bpm, left axis deviation, left anterior fascicular block, poor R wave progression, rare PVC, no acute st/t changes   Recent Labs: 09/19/2014: Pro B Natriuretic peptide (BNP) 527.0* 02/21/2015: ALT 17 07/28/2015: B Natriuretic Peptide 1000.0*; TSH 1.771 07/29/2015: Hemoglobin 12.0*; Platelets 164 07/31/2015: Magnesium 1.8 08/01/2015: BUN 30*; Creatinine, Ser 0.88; Potassium 3.5; Sodium 130*  12/25/2014: Cholesterol 96; HDL 43.30; LDL Cholesterol 38; Total CHOL/HDL Ratio 2; Triglycerides 74.0; VLDL 14.8   CrCl cannot be calculated (Patient has no serum creatinine result on file.).   Wt Readings from Last 3 Encounters:  09/05/15 217 lb (98.431 kg)  08/29/15 242 lb (109.77 kg)  08/03/15 242 lb 8 oz (109.997 kg)     Other studies reviewed: Additional studies/records reviewed today include: summarized above  ASSESSMENT AND PLAN:  1. Chronic combined CHF: Much improved. Goal weight of 225 pounds. Current weight of 217 pounds. He does not appear to be volume overloaded at this time. Torsemide has aided with his volume management a great deal. Since changing to torsemide 40 mg bid on 3/26 he has not had to take prn metolazone regularly, only a couple days s/p change. No metolazone in 2-3 weeks. Weight has been stable. Taking KCl repletion. Knows to take prn metolazone for weight over 230 pounds with added KCl. Limit po fluids and salt. Continue Lopressor as below.   2. Chronic Afib: Currently rate controlled with a bradycardic rate in the mid 50's bpm. Heart rate at home averages in the low to mid 50's bpm. Will hold low-dose digoxin 0.0625 mg daily at this time given his bradycardia. If heart rate starts to increase towards 80 bpm patient should restart low-dose digoxin and call our office for a nurse visit. Continue Lopressor 50 mg bid. CHADS2VASc of 5 (CHF, HTN, age x 2, vascular disease). Not currently on long term, full-dose anticoagulation given his history of  chronic type B aortic dissection.   3. Aortic aneurysm: Recently seen by cardiothoracic surgery with stable imaging showing descending aorta measuring 5.9 cm. Not felt to be an open surgical candidate and aneurysm felt to be too large for graft repair. Per cardiothoracic surgery, no further imaging needed as he will be medically managed with optimal blood pressure control.   4. CAD s/p CABG as above: No symptoms of angina. No plans for ischemic evaluation at this  time. Continue Lopressor, enalapril, simvastatin and aspirin 81 mg daily.  5. HTN: Well controlled at today's visit. Optimal blood pressure control is advised given #3. Continue Lopressor 50 mg bid and enalapril 20 mg bid. Limit salt intake.   6. HLD: On simvastatin. Lipid from 12/2014 showed LDL of 38. Follow up with PCP for fasting lipids and LFT.   7. Cataract: He would be ok for cataract procedure at this time. Continue current medications during this procedure.   Disposition: F/u with Dr. Rockey Situ, MD in 3 months.   Current medicines are reviewed at length with the patient today.  The patient did not have any concerns regarding medicines.  Melvern Banker PA-C 09/05/2015 2:09 PM     [address] [place], [place] [id number] [phone]

## 2015-09-06 LAB — DIGOXIN LEVEL: Digoxin, Serum: 0.4 ng/mL — ABNORMAL LOW (ref 0.5–0.9)

## 2015-09-06 LAB — BASIC METABOLIC PANEL
BUN/Creatinine Ratio: 30 — ABNORMAL HIGH (ref 10–24)
BUN: 35 mg/dL — ABNORMAL HIGH (ref 8–27)
CALCIUM: 9.1 mg/dL (ref 8.6–10.2)
CHLORIDE: 101 mmol/L (ref 96–106)
CO2: 25 mmol/L (ref 18–29)
Creatinine, Ser: 1.16 mg/dL (ref 0.76–1.27)
GFR calc non Af Amer: 58 mL/min/{1.73_m2} — ABNORMAL LOW (ref >59)
GFR, EST AFRICAN AMERICAN: 67 mL/min/{1.73_m2} (ref >59)
Glucose: 95 mg/dL (ref 65–99)
POTASSIUM: 4.2 mmol/L (ref 3.5–5.2)
Sodium: 142 mmol/L (ref 134–144)

## 2015-09-21 ENCOUNTER — Encounter: Payer: Medicare Other | Admitting: Cardiovascular Disease

## 2015-10-16 ENCOUNTER — Encounter: Payer: Self-pay | Admitting: *Deleted

## 2015-10-18 ENCOUNTER — Ambulatory Visit: Payer: Medicare Other | Admitting: Registered Nurse

## 2015-10-18 ENCOUNTER — Ambulatory Visit
Admission: RE | Admit: 2015-10-18 | Discharge: 2015-10-18 | Disposition: A | Discharge status: Home / Self Care | Payer: Medicare Other | Source: Ambulatory Visit | Attending: Ophthalmology | Admitting: Ophthalmology

## 2015-10-18 ENCOUNTER — Other Ambulatory Visit: Payer: Self-pay | Admitting: Cardiovascular Disease

## 2015-10-18 ENCOUNTER — Encounter
Admission: RE | Disposition: A | Discharge status: Home / Self Care | Payer: Self-pay | Source: Ambulatory Visit | Attending: Ophthalmology

## 2015-10-18 ENCOUNTER — Encounter: Payer: Self-pay | Admitting: *Deleted

## 2015-10-18 DIAGNOSIS — Z96653 Presence of artificial knee joint, bilateral: Secondary | ICD-10-CM | POA: Diagnosis not present

## 2015-10-18 DIAGNOSIS — I509 Heart failure, unspecified: Secondary | ICD-10-CM | POA: Insufficient documentation

## 2015-10-18 DIAGNOSIS — H2181 Floppy iris syndrome: Secondary | ICD-10-CM | POA: Diagnosis not present

## 2015-10-18 DIAGNOSIS — I482 Chronic atrial fibrillation: Secondary | ICD-10-CM | POA: Insufficient documentation

## 2015-10-18 DIAGNOSIS — I110 Hypertensive heart disease with heart failure: Secondary | ICD-10-CM | POA: Diagnosis not present

## 2015-10-18 DIAGNOSIS — M1990 Unspecified osteoarthritis, unspecified site: Secondary | ICD-10-CM | POA: Insufficient documentation

## 2015-10-18 DIAGNOSIS — H2512 Age-related nuclear cataract, left eye: Secondary | ICD-10-CM | POA: Diagnosis not present

## 2015-10-18 DIAGNOSIS — H9193 Unspecified hearing loss, bilateral: Secondary | ICD-10-CM | POA: Insufficient documentation

## 2015-10-18 DIAGNOSIS — I716 Thoracoabdominal aortic aneurysm, without rupture: Secondary | ICD-10-CM | POA: Diagnosis not present

## 2015-10-18 DIAGNOSIS — R0602 Shortness of breath: Secondary | ICD-10-CM | POA: Insufficient documentation

## 2015-10-18 DIAGNOSIS — I739 Peripheral vascular disease, unspecified: Secondary | ICD-10-CM | POA: Diagnosis not present

## 2015-10-18 DIAGNOSIS — I2510 Atherosclerotic heart disease of native coronary artery without angina pectoris: Secondary | ICD-10-CM | POA: Diagnosis not present

## 2015-10-18 DIAGNOSIS — Z951 Presence of aortocoronary bypass graft: Secondary | ICD-10-CM | POA: Insufficient documentation

## 2015-10-18 DIAGNOSIS — Z87891 Personal history of nicotine dependence: Secondary | ICD-10-CM | POA: Diagnosis not present

## 2015-10-18 HISTORY — DX: Edema, unspecified: R60.9

## 2015-10-18 HISTORY — PX: CATARACT EXTRACTION W/PHACO: SHX586

## 2015-10-18 HISTORY — DX: Dissection of unspecified site of aorta: I71.00

## 2015-10-18 SURGERY — PHACOEMULSIFICATION, CATARACT, WITH IOL INSERTION
Anesthesia: Monitor Anesthesia Care | Site: Eye | Laterality: Left | Wound class: Clean

## 2015-10-18 MED ORDER — EPINEPHRINE HCL 1 MG/ML IJ SOLN
INTRAMUSCULAR | Status: AC
  Filled 2015-10-18: qty 1

## 2015-10-18 MED ORDER — SODIUM CHLORIDE 0.9 % IV SOLN
INTRAVENOUS | Status: DC
  Administered 2015-10-18 (×2): via INTRAVENOUS

## 2015-10-18 MED ORDER — CEFUROXIME OPHTHALMIC INJECTION 1 MG/0.1 ML
INJECTION | OPHTHALMIC | Status: DC | PRN
  Administered 2015-10-18: 0.1 mL via INTRACAMERAL

## 2015-10-18 MED ORDER — CEFUROXIME OPHTHALMIC INJECTION 1 MG/0.1 ML
INJECTION | OPHTHALMIC | Status: AC
  Filled 2015-10-18: qty 0.1

## 2015-10-18 MED ORDER — MOXIFLOXACIN HCL 0.5 % OP SOLN
OPHTHALMIC | Status: AC
  Filled 2015-10-18: qty 3

## 2015-10-18 MED ORDER — POVIDONE-IODINE 5 % OP SOLN
OPHTHALMIC | Status: AC
  Administered 2015-10-18: 1 via OPHTHALMIC
  Filled 2015-10-18: qty 30

## 2015-10-18 MED ORDER — SODIUM HYALURONATE 23 MG/ML IO SOLN
INTRAOCULAR | Status: DC | PRN
  Administered 2015-10-18: 0.6 mL via INTRAOCULAR

## 2015-10-18 MED ORDER — EPINEPHRINE HCL 1 MG/ML IJ SOLN
INTRAOCULAR | Status: DC | PRN
  Administered 2015-10-18: 13:00:00 via OPHTHALMIC

## 2015-10-18 MED ORDER — ARMC OPHTHALMIC DILATING GEL
1.0000 "application " | OPHTHALMIC | Status: DC | PRN
  Administered 2015-10-18: 1 via OPHTHALMIC

## 2015-10-18 MED ORDER — ARMC OPHTHALMIC DILATING GEL
OPHTHALMIC | Status: AC
  Administered 2015-10-18: 1 via OPHTHALMIC
  Filled 2015-10-18: qty 0.25

## 2015-10-18 MED ORDER — SODIUM HYALURONATE 10 MG/ML IO SOLN
INTRAOCULAR | Status: AC
  Filled 2015-10-18: qty 0.85

## 2015-10-18 MED ORDER — MOXIFLOXACIN HCL 0.5 % OP SOLN: 1.0000 [drp] | OPHTHALMIC | Status: DC | PRN

## 2015-10-18 MED ORDER — SODIUM HYALURONATE 23 MG/ML IO SOLN
INTRAOCULAR | Status: AC
  Filled 2015-10-18: qty 0.6

## 2015-10-18 MED ORDER — MOXIFLOXACIN HCL 0.5 % OP SOLN
OPHTHALMIC | Status: DC | PRN
  Administered 2015-10-18: 1 [drp] via OPHTHALMIC

## 2015-10-18 MED ORDER — LIDOCAINE HCL (PF) 4 % IJ SOLN
INTRAOCULAR | Status: DC | PRN
  Administered 2015-10-18: 13:00:00 via OPHTHALMIC

## 2015-10-18 MED ORDER — POVIDONE-IODINE 5 % OP SOLN
1.0000 "application " | Freq: Once | OPHTHALMIC | Status: AC
  Administered 2015-10-18: 1 via OPHTHALMIC

## 2015-10-18 MED ORDER — TETRACAINE HCL 0.5 % OP SOLN
1.0000 [drp] | Freq: Once | OPHTHALMIC | Status: AC
  Administered 2015-10-18: 1 [drp] via OPHTHALMIC

## 2015-10-18 MED ORDER — TETRACAINE HCL 0.5 % OP SOLN
OPHTHALMIC | Status: AC
Start: 2015-10-18 — End: 2015-10-18
  Administered 2015-10-18: 1 [drp] via OPHTHALMIC
  Filled 2015-10-18: qty 2

## 2015-10-18 MED ORDER — CARBACHOL 0.01 % IO SOLN
INTRAOCULAR | Status: DC | PRN
  Administered 2015-10-18: .5 mL via INTRAOCULAR

## 2015-10-18 MED ORDER — LIDOCAINE HCL (PF) 4 % IJ SOLN
INTRAMUSCULAR | Status: AC
  Filled 2015-10-18: qty 5

## 2015-10-18 MED ORDER — SODIUM HYALURONATE 10 MG/ML IO SOLN
INTRAOCULAR | Status: DC | PRN
  Administered 2015-10-18: 0.85 mL via INTRAOCULAR

## 2015-10-18 SURGICAL SUPPLY — 23 items
CANNULA ANT/CHMB 27G (MISCELLANEOUS) ×1 IMPLANT
CANNULA ANT/CHMB 27GA (MISCELLANEOUS) ×3 IMPLANT
CUP MEDICINE 2OZ PLAST GRAD ST (MISCELLANEOUS) ×3 IMPLANT
GLOVE BIO SURGEON STRL SZ8 (GLOVE) ×3 IMPLANT
GLOVE BIOGEL M 6.5 STRL (GLOVE) ×3 IMPLANT
GLOVE SURG LX 7.5 STRW (GLOVE) ×2
GLOVE SURG LX STRL 7.5 STRW (GLOVE) ×1 IMPLANT
GOWN STRL REUS W/ TWL LRG LVL3 (GOWN DISPOSABLE) ×2 IMPLANT
GOWN STRL REUS W/TWL LRG LVL3 (GOWN DISPOSABLE) ×6
LENS IOL ACRSF IQ PC 20.0 (Intraocular Lens) IMPLANT
LENS IOL ACRYSOF IQ POST 20.0 (Intraocular Lens) ×3 IMPLANT
PACK CATARACT (MISCELLANEOUS) ×3 IMPLANT
PACK CATARACT BRASINGTON LX (MISCELLANEOUS) ×3 IMPLANT
PACK EYE AFTER SURG (MISCELLANEOUS) ×3 IMPLANT
SOL BSS BAG (MISCELLANEOUS) ×3
SOL PREP PVP 2OZ (MISCELLANEOUS) ×3
SOLUTION BSS BAG (MISCELLANEOUS) ×1 IMPLANT
SOLUTION PREP PVP 2OZ (MISCELLANEOUS) ×1 IMPLANT
SYR 3ML LL SCALE MARK (SYRINGE) ×3 IMPLANT
SYR 5ML LL (SYRINGE) ×3 IMPLANT
SYR TB 1ML 27GX1/2 LL (SYRINGE) ×3 IMPLANT
WATER STERILE IRR 1000ML POUR (IV SOLUTION) ×3 IMPLANT
WIPE NON LINTING 3.25X3.25 (MISCELLANEOUS) ×3 IMPLANT

## 2015-10-18 NOTE — Anesthesia Postprocedure Evaluation (Signed)
Anesthesia Post Note  Patient: Lee Gutierrez  Procedure(s) Performed: Procedure(s) (LRB): CATARACT EXTRACTION PHACO AND INTRAOCULAR LENS PLACEMENT (IOC) (Left)  Anesthesia Type: MAC Level of consciousness: awake and alert Pain management: pain level controlled Vital Signs Assessment: post-procedure vital signs reviewed and stable Respiratory status: spontaneous breathing, nonlabored ventilation, respiratory function stable and patient connected to nasal cannula oxygen Cardiovascular status: stable and blood pressure returned to baseline Anesthetic complications: no    Last Vitals:  Filed Vitals:   10/18/15 1103 10/18/15 1346  BP: 106/68 128/88  Pulse: 53 87  Temp: 35.9 C 36.4 C  Resp: 18 12    Last Pain:  Filed Vitals:   10/18/15 1347  PainSc: 0-No pain                 Alison Stalling

## 2015-10-18 NOTE — Anesthesia Preprocedure Evaluation (Signed)
Anesthesia Evaluation  Patient identified by MRN, date of birth, ID band Patient awake    Reviewed: Allergy & Precautions, H&P , NPO status , Patient's Chart, lab work & pertinent test results  History of Anesthesia Complications Negative for: history of anesthetic complications  Airway Mallampati: III  TM Distance: <3 FB Neck ROM: limited    Dental  (+) Poor Dentition, Chipped, Missing   Pulmonary shortness of breath and with exertion, former smoker,    Pulmonary exam normal breath sounds clear to auscultation       Cardiovascular Exercise Tolerance: Poor hypertension, + CAD, + Peripheral Vascular Disease, +CHF and + DOE  (-) Past MI Normal cardiovascular exam+ dysrhythmias Atrial Fibrillation II Rhythm:regular Rate:Normal     Neuro/Psych negative neurological ROS  negative psych ROS   GI/Hepatic negative GI ROS, Neg liver ROS, neg GERD  ,  Endo/Other  negative endocrine ROS  Renal/GU negative Renal ROS  negative genitourinary   Musculoskeletal  (+) Arthritis ,   Abdominal   Peds  Hematology negative hematology ROS (+)   Anesthesia Other Findings Past Medical History:   Hypertension                                                 Arthritis                                                    Thoracoabdominal aortic aneurysm ([place])          06/2009         Comment:large but stable aneurysm. 5 x 4.9, proximal               descending thoracic aortic aorta measuring 5.8               x 5.1, the aortic 5.6 x 5.5 and distal thoracic              aorta 3.9 x 4.1, His infrarenal aneurysm               measured 3 x 2.7.   Blood transfusion                                            Chronic combined systolic and diastolic CHF (c* [id number]     Comment:a. EF 35-40% by echo in 02/2015 w/ severe               global hypokinesis, aortic aneurysm 4.9 cm,               trivial AI, moderate MR, and severe TR   HOH (hard of  hearing)                                          Comment:bilateral hearing aids   History of stress test                          12/2011  Comment:Showed mild inferior thinning felt most likely               due to attenuation artifact.   Chronic atrial fibrillation (HCC)                              Comment:a. Chads2vasc score is 5 (CHF, HTN, CAD, Age               (2)). Not currently on anticoagulation               secondary to aneurysm   Dissection, aorta (HCC)                                      Edema                                                          Comment:FEET/LEG   Coronary artery disease                                        Comment:a. s/p CABG in 2011   Dysrhythmia                                                 Past Surgical History:   REPLACEMENT TOTAL KNEE                                          Comment:bilaterally   TONSILLECTOMY                                                 HERNIA REPAIR                                                 CARDIAC CATHETERIZATION                                       CORONARY ARTERY BYPASS GRAFT                     Feb 2011     ANKLE FUSION                                     04/22/2012     Comment:Procedure: ARTHRODESIS ANKLE;  Surgeon: Wylene Simmer, MD;  Location: [facility];  Service:  Orthopedics;  Laterality: Left;  Left Ankle and              Subtalar Arthrodesis    JOINT REPLACEMENT                                               Comment:Bilateral knee  BMI    Body Mass Index   30.70 kg/m 2      Reproductive/Obstetrics negative OB ROS                             Anesthesia Physical Anesthesia Plan  ASA: IV  Anesthesia Plan: MAC   Post-op Pain Management:    Induction:   Airway Management Planned:   Additional Equipment:   Intra-op Plan:   Post-operative Plan:   Informed Consent: I have reviewed the patients History and Physical, chart, labs and  discussed the procedure including the risks, benefits and alternatives for the proposed anesthesia with the patient or authorized representative who has indicated his/her understanding and acceptance.   Dental Advisory Given  Plan Discussed with: Anesthesiologist, CRNA and Surgeon  Anesthesia Plan Comments:         Anesthesia Quick Evaluation

## 2015-10-18 NOTE — Op Note (Signed)
OPERATIVE NOTE  Lee Gutierrez [id number] 10/18/2015   PREOPERATIVE DIAGNOSIS:  Nuclear sclerotic cataract left eye.  H25.12   POSTOPERATIVE DIAGNOSIS:     1.  Nuclear sclerotic cataract left eye.   2.  Intraoperative floppy iris syndrome.   PROCEDURE:  Phacoemusification with posterior chamber intraocular lens placement of the left eye   LENS:   Implant Name Type Inv. Item Serial No. Manufacturer Lot No. LRB No. Used  IMPLANT LENS - [id number] Intraocular Lens IMPLANT LENS [id number] ALCON   Left 1       [id number] 20.0   ULTRASOUND TIME: 1 minutes 06 seconds.  CDE 10.40   SURGEON:  Benay Pillow, MD, MPH   ANESTHESIA:  Topical with tetracaine drops and 2% Xylocaine jelly, augmented with 1% preservative-free intracameral lidocaine.   COMPLICATIONS:  None.   DESCRIPTION OF PROCEDURE:  The patient was identified in the holding room and transported to the operating room and placed in the supine position under the operating microscope.  The left eye was identified as the operative eye and it was prepped and draped in the usual sterile ophthalmic fashion.   A 1.0 millimeter clear-corneal paracentesis was made at the 5:00 position. 0.5 ml of preservative-free 1% lidocaine with epinephrine was injected into the anterior chamber.  The anterior chamber was filled with Healon 5 viscoelastic.  A 2.4 millimeter keratome was used to make a near-clear corneal incision at the 2:00 position.  A curvilinear capsulorrhexis was made with a cystotome and capsulorrhexis forceps.  Balanced salt solution was used to hydrodissect and hydrodelineate the nucleus.   Phacoemulsification was then used in stop and chop fashion to remove the lens nucleus and epinucleus.  The iris was very floppy, but there were no complications.   The remaining cortex was then removed using the irrigation and aspiration handpiece. Healon was then placed into the capsular bag to distend it for lens placement.  A lens was then  injected into the capsular bag.  The remaining viscoelastic was aspirated.  Miostat was injected to help prevent iris prolapse during wound hydration.   Wounds were hydrated with balanced salt solution.  The anterior chamber was inflated to a physiologic pressure with balanced salt solution.   Intracameral cefuroxime 0.1 mL at 10mg /mL was injected into the eye.  No wound leaks were noted.  Topical Vigamox drops were applied to the eye.  The patient was taken to the recovery room in stable condition without complications of anesthesia or surgery  Benay Pillow 10/18/2015, 1:44 PM

## 2015-10-18 NOTE — Anesthesia Procedure Notes (Signed)
Procedure Name: MAC Date/Time: 10/18/2015 1:18 PM Performed by: Doreen Salvage Pre-anesthesia Checklist: Patient identified, Emergency Drugs available, Suction available, Patient being monitored and Timeout performed Patient Re-evaluated:Patient Re-evaluated prior to inductionOxygen Delivery Method: Nasal cannula

## 2015-10-18 NOTE — Transfer of Care (Signed)
Immediate Anesthesia Transfer of Care Note  Patient: Lee Gutierrez  Procedure(s) Performed: Procedure(s) with comments: CATARACT EXTRACTION PHACO AND INTRAOCULAR LENS PLACEMENT (IOC) (Left) - [place] 01:06 AP% 15.7 CDE 10.40 Fluid Pack lot # [id number]  Patient Location: PHASE II  Anesthesia Type:MAC  Level of Consciousness: Awake, Alert, Oriented  Airway & Oxygen Therapy: Patient Spontanous Breathing and Patient on room air   Post-op Assessment: Report given to RN and Post -op Vital signs reviewed and stable  Post vital signs: Reviewed and stable  Last Vitals:  Filed Vitals:   10/18/15 1103 10/18/15 1346  BP: 106/68 128/88  Pulse: 53 87  Temp: 35.9 C 36.4 C  Resp: 18 12    Complications: No apparent anesthesia complications

## 2015-10-18 NOTE — H&P (Signed)
  The History and Physical notes are on paper, have been signed, and are to be scanned. The patient remains stable and unchanged from the H&P.   Previous H&P reviewed, patient examined, and there are no changes.  Benay Pillow 10/18/2015 12:06 PM

## 2015-10-18 NOTE — Discharge Instructions (Signed)
Eye Surgery Discharge Instructions  Expect mild scratchy sensation or mild soreness. DO NOT RUB YOUR EYE!  The day of surgery:  Minimal physical activity, but bed rest is not required  No reading, computer work, or close hand work  No bending, lifting, or straining.  May watch TV  For 24 hours:  No driving, legal decisions, or alcoholic beverages  Safety precautions  Eat anything you prefer: It is better to start with liquids, then soup then solid foods.  _____ Eye patch should be worn until postoperative exam tomorrow.  ____ Solar shield eyeglasses should be worn for comfort in the sunlight/patch while sleeping  Resume all regular medications including aspirin or Coumadin if these were discontinued prior to surgery. You may shower, bathe, shave, or wash your hair. Tylenol may be taken for mild discomfort.  Call your doctor if you experience significant pain, nausea, or vomiting, fever > 101 or other signs of infection. [phone] or [phone] Specific instructions:  Follow-up Information    Follow up with Benay Pillow, MD.   Specialty:  Ophthalmology   Why:  follow up 6/9 at [facility] information:   [place] [phone]     AMBULATORY SURGERY  DISCHARGE INSTRUCTIONS   1) The drugs that you were given will stay in your system until tomorrow so for the next 24 hours you should not:  A) Drive an automobile B) Make any legal decisions C) Drink any alcoholic beverage   2) You may resume regular meals tomorrow.  Today it is better to start with liquids and gradually work up to solid foods.  You may eat anything you prefer, but it is better to start with liquids, then soup and crackers, and gradually work up to solid foods.   3) Please notify your doctor immediately if you have any unusual bleeding, trouble breathing, redness and pain at the surgery site, drainage, fever, or pain not relieved by  medication.    4) Additional Instructions:        Please contact your physician with any problems or Same Day Surgery at [phone], Monday through Friday 6 am to 4 pm, or [place] [facility] number at [phone].

## 2015-10-22 ENCOUNTER — Other Ambulatory Visit: Payer: Self-pay | Admitting: Nurse Practitioner

## 2015-10-22 ENCOUNTER — Other Ambulatory Visit: Payer: Self-pay | Admitting: Internal Medicine

## 2015-10-23 ENCOUNTER — Other Ambulatory Visit: Payer: Self-pay | Admitting: Cardiovascular Disease

## 2015-10-23 MED ORDER — POTASSIUM CHLORIDE 20 MEQ PO PACK: 20.0000 meq | PACK | Freq: Once | ORAL | Status: DC

## 2015-10-23 MED ORDER — POTASSIUM CHLORIDE CRYS ER 20 MEQ PO TBCR: 20.0000 meq | EXTENDED_RELEASE_TABLET | Freq: Every day | ORAL | Status: AC

## 2015-10-23 NOTE — Telephone Encounter (Signed)
Spoke w/ [name].  She reports that pt has been taking Klor-con tablets, not the packet.  Pt was d/c'd from [facility] w/ packets, but they are too expensive.  Pt is able to swallow tabs w/o difficulty. Advised her that I will send in refills and update med list.

## 2015-10-23 NOTE — Telephone Encounter (Signed)
Left message for [name] to call back

## 2015-10-23 NOTE — Telephone Encounter (Signed)
Please advise if the patient should be taking the potassium chloride packet or tablets.

## 2015-10-23 NOTE — Telephone Encounter (Signed)
°*  STAT* If patient is at the pharmacy, call can be transferred to refill team.   1. Which medications need to be refilled? (please list name of each medication and dose if known) KLOR-CON tablets   2. Which pharmacy/location (including street and city if local pharmacy) is medication to be sent to? [facility] [address]   3. Do they need a 30 day or 90 day supply? 90 day

## 2015-10-23 NOTE — Telephone Encounter (Signed)
Refill sent for Klor-Con

## 2015-10-24 ENCOUNTER — Other Ambulatory Visit: Payer: Self-pay | Admitting: Cardiovascular Disease

## 2015-10-24 NOTE — Telephone Encounter (Signed)
potassium chloride SA (K-DUR,KLOR-CON) 20 MEQ tablet  Medication   Date: 10/23/2015  Department: [facility]  Ordering/Authorizing: Minna Merritts, MD      Order Providers    Prescribing Provider Encounter Provider   Minna Merritts, MD Minna Merritts, MD    Medication Detail      Disp Refills Start End     potassium chloride SA (K-DUR,KLOR-CON) 20 MEQ tablet 90 tablet 3 10/23/2015     Sig - Route: Take 1 tablet (20 mEq total) by mouth daily. - Oral    E-Prescribing Status: Receipt confirmed by pharmacy (10/23/2015 4:03 PM EDT)     Pharmacy    [id number] - [place], [place]

## 2015-10-25 ENCOUNTER — Institutional Professional Consult (permissible substitution): Payer: Medicare Other | Admitting: Cardiovascular Disease

## 2015-11-07 ENCOUNTER — Other Ambulatory Visit: Payer: Self-pay | Admitting: Internal Medicine

## 2015-11-07 NOTE — Telephone Encounter (Signed)
Last filled 08-13-15 #90 Last OV 05-31-15 Next OV 12-31-15

## 2015-11-07 NOTE — Telephone Encounter (Signed)
Left refill on voice mail at pharmacy  

## 2015-11-07 NOTE — Telephone Encounter (Signed)
Approved: #[id number] x [id number] 

## 2015-11-20 ENCOUNTER — Telehealth: Payer: Self-pay | Admitting: Internal Medicine

## 2015-12-05 ENCOUNTER — Ambulatory Visit: Payer: Medicare Other | Admitting: Cardiovascular Disease

## 2015-12-11 NOTE — Telephone Encounter (Signed)
Spoke to daughter Lee Gutierrez to sleep last night and just didn't wake up Clearly seems to have been his heart Can do death certificate  Passed on my condolences

## 2015-12-11 NOTE — Telephone Encounter (Signed)
Patient Name: Lee Gutierrez  DOB: 08/29/1930    Initial Comment Officer [place] @ [phone] states patient has expired and they need death certificate signed   Nurse Assessment  Nurse: Leilani Merl, RN, Nira Conn Date/Time (Eastern Time): Nov 23, 2015 7:24:02 AM  Confirm and document reason for call. If symptomatic, describe symptoms. You must click the next button to save text entered. ---[place] @ [phone] states patient has expired and they need death certificate signed  Has the patient traveled out of the country within the last 30 days? ---Not Applicable  Does the patient have any new or worsening symptoms? ---No     Guidelines    Guideline Title Affirmed Question Affirmed Notes       Final Disposition User        Comments  Called office, information given.

## 2015-12-11 DEATH — deceased

## 2015-12-31 ENCOUNTER — Encounter: Payer: Medicare Other | Admitting: Internal Medicine
